# Patient Record
Sex: Female | Born: 1960 | Race: White | Hispanic: No | Smoking: Never smoker
Health system: Southern US, Community
[De-identification: ages and names within clinical notes are randomized; demographics above are authoritative.]

## PROBLEM LIST (undated history)

## (undated) DIAGNOSIS — Z8619 Personal history of other infectious and parasitic diseases: Secondary | ICD-10-CM

## (undated) DIAGNOSIS — I519 Heart disease, unspecified: Secondary | ICD-10-CM

## (undated) DIAGNOSIS — I2510 Atherosclerotic heart disease of native coronary artery without angina pectoris: Secondary | ICD-10-CM

## (undated) DIAGNOSIS — K589 Irritable bowel syndrome without diarrhea: Secondary | ICD-10-CM

## (undated) DIAGNOSIS — H18459 Nodular corneal degeneration, unspecified eye: Secondary | ICD-10-CM

## (undated) DIAGNOSIS — I2119 ST elevation (STEMI) myocardial infarction involving other coronary artery of inferior wall: Secondary | ICD-10-CM

## (undated) DIAGNOSIS — R140 Abdominal distension (gaseous): Secondary | ICD-10-CM

## (undated) DIAGNOSIS — G4733 Obstructive sleep apnea (adult) (pediatric): Principal | ICD-10-CM

## (undated) DIAGNOSIS — G4730 Sleep apnea, unspecified: Secondary | ICD-10-CM

## (undated) DIAGNOSIS — J302 Other seasonal allergic rhinitis: Secondary | ICD-10-CM

## (undated) DIAGNOSIS — E785 Hyperlipidemia, unspecified: Secondary | ICD-10-CM

## (undated) HISTORY — DX: Other seasonal allergic rhinitis: J30.2

## (undated) HISTORY — DX: Abdominal distension (gaseous): R14.0

## (undated) HISTORY — PX: OTHER SURGICAL HISTORY: SHX169

## (undated) HISTORY — DX: Sleep apnea, unspecified: G47.30

## (undated) HISTORY — DX: Irritable bowel syndrome, unspecified: K58.9

## (undated) HISTORY — PX: CARDIAC CATHETERIZATION: SHX172

## (undated) HISTORY — DX: Nodular corneal degeneration, unspecified eye: H18.459

## (undated) HISTORY — DX: ST elevation (STEMI) myocardial infarction involving other coronary artery of inferior wall: I21.19

## (undated) HISTORY — DX: Heart disease, unspecified: I51.9

## (undated) HISTORY — DX: Obstructive sleep apnea (adult) (pediatric): G47.33

## (undated) HISTORY — DX: Hyperlipidemia, unspecified: E78.5

---
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
## 1979-07-15 HISTORY — PX: WISDOM TOOTH EXTRACTION: SHX21

## 1998-09-13 ENCOUNTER — Other Ambulatory Visit: Admission: RE | Admit: 1998-09-13 | Discharge: 1998-09-13 | Payer: Self-pay | Admitting: Obstetrics and Gynecology

## 1999-09-24 ENCOUNTER — Other Ambulatory Visit: Admission: RE | Admit: 1999-09-24 | Discharge: 1999-09-24 | Payer: Self-pay | Admitting: Obstetrics and Gynecology

## 2001-01-07 ENCOUNTER — Other Ambulatory Visit: Admission: RE | Admit: 2001-01-07 | Discharge: 2001-01-07 | Payer: Self-pay | Admitting: Obstetrics and Gynecology

## 2002-02-09 ENCOUNTER — Other Ambulatory Visit: Admission: RE | Admit: 2002-02-09 | Discharge: 2002-02-09 | Payer: Self-pay | Admitting: Obstetrics and Gynecology

## 2002-12-08 ENCOUNTER — Encounter: Admission: RE | Admit: 2002-12-08 | Discharge: 2003-03-08 | Payer: Self-pay | Admitting: Family Medicine

## 2003-02-13 ENCOUNTER — Other Ambulatory Visit: Admission: RE | Admit: 2003-02-13 | Discharge: 2003-02-13 | Payer: Self-pay | Admitting: Obstetrics and Gynecology

## 2004-03-20 ENCOUNTER — Encounter: Admission: RE | Admit: 2004-03-20 | Discharge: 2004-03-20 | Payer: Self-pay | Admitting: Family Medicine

## 2004-04-18 ENCOUNTER — Ambulatory Visit (HOSPITAL_COMMUNITY): Admission: RE | Admit: 2004-04-18 | Discharge: 2004-04-18 | Payer: Self-pay | Admitting: Family Medicine

## 2005-07-14 DIAGNOSIS — G4730 Sleep apnea, unspecified: Secondary | ICD-10-CM

## 2005-07-14 HISTORY — DX: Sleep apnea, unspecified: G47.30

## 2007-02-09 ENCOUNTER — Encounter: Admission: RE | Admit: 2007-02-09 | Discharge: 2007-02-09 | Payer: Self-pay | Admitting: Family Medicine

## 2007-03-10 ENCOUNTER — Ambulatory Visit: Payer: Self-pay | Admitting: Internal Medicine

## 2007-03-10 DIAGNOSIS — R5383 Other fatigue: Secondary | ICD-10-CM | POA: Insufficient documentation

## 2007-03-10 DIAGNOSIS — R5381 Other malaise: Secondary | ICD-10-CM | POA: Insufficient documentation

## 2008-02-03 ENCOUNTER — Ambulatory Visit (HOSPITAL_COMMUNITY): Admission: RE | Admit: 2008-02-03 | Discharge: 2008-02-03 | Payer: Self-pay | Admitting: Obstetrics and Gynecology

## 2009-03-22 ENCOUNTER — Ambulatory Visit (HOSPITAL_COMMUNITY): Admission: RE | Admit: 2009-03-22 | Discharge: 2009-03-22 | Payer: Self-pay | Admitting: Obstetrics and Gynecology

## 2009-04-27 ENCOUNTER — Encounter: Admission: RE | Admit: 2009-04-27 | Discharge: 2009-04-27 | Payer: Self-pay | Admitting: Obstetrics and Gynecology

## 2009-05-23 ENCOUNTER — Encounter: Admission: RE | Admit: 2009-05-23 | Discharge: 2009-05-23 | Payer: Self-pay | Admitting: Internal Medicine

## 2009-05-23 ENCOUNTER — Other Ambulatory Visit: Admission: RE | Admit: 2009-05-23 | Discharge: 2009-05-23 | Payer: Self-pay | Admitting: Interventional Radiology

## 2009-05-23 ENCOUNTER — Encounter (INDEPENDENT_AMBULATORY_CARE_PROVIDER_SITE_OTHER): Payer: Self-pay | Admitting: Interventional Radiology

## 2010-09-18 ENCOUNTER — Other Ambulatory Visit (HOSPITAL_COMMUNITY): Payer: Self-pay | Admitting: Obstetrics and Gynecology

## 2010-09-18 DIAGNOSIS — Z1231 Encounter for screening mammogram for malignant neoplasm of breast: Secondary | ICD-10-CM

## 2010-09-26 ENCOUNTER — Ambulatory Visit (HOSPITAL_COMMUNITY)
Admission: RE | Admit: 2010-09-26 | Discharge: 2010-09-26 | Disposition: A | Discharge status: Home / Self Care | Payer: BC Managed Care – PPO | Source: Ambulatory Visit | Attending: Obstetrics and Gynecology | Admitting: Obstetrics and Gynecology

## 2010-09-26 DIAGNOSIS — Z1231 Encounter for screening mammogram for malignant neoplasm of breast: Secondary | ICD-10-CM

## 2011-06-02 ENCOUNTER — Other Ambulatory Visit: Payer: Self-pay | Admitting: Registered Nurse

## 2011-06-02 ENCOUNTER — Other Ambulatory Visit (HOSPITAL_COMMUNITY)
Admission: RE | Admit: 2011-06-02 | Discharge: 2011-06-02 | Disposition: A | Discharge status: Home / Self Care | Payer: BC Managed Care – PPO | Source: Ambulatory Visit | Attending: Internal Medicine | Admitting: Internal Medicine

## 2011-06-02 ENCOUNTER — Other Ambulatory Visit: Payer: Self-pay | Admitting: Internal Medicine

## 2011-06-02 DIAGNOSIS — E049 Nontoxic goiter, unspecified: Secondary | ICD-10-CM

## 2011-06-02 DIAGNOSIS — Z01419 Encounter for gynecological examination (general) (routine) without abnormal findings: Secondary | ICD-10-CM | POA: Insufficient documentation

## 2011-06-17 ENCOUNTER — Encounter: Payer: Self-pay | Admitting: Gastroenterology

## 2011-07-21 ENCOUNTER — Encounter: Payer: Self-pay | Admitting: Gastroenterology

## 2011-07-21 ENCOUNTER — Ambulatory Visit (AMBULATORY_SURGERY_CENTER): Payer: BC Managed Care – PPO | Admitting: *Deleted

## 2011-07-21 ENCOUNTER — Ambulatory Visit
Admission: RE | Admit: 2011-07-21 | Discharge: 2011-07-21 | Disposition: A | Discharge status: Home / Self Care | Payer: BC Managed Care – PPO | Source: Ambulatory Visit | Attending: Internal Medicine | Admitting: Internal Medicine

## 2011-07-21 VITALS — Ht 63.0 in | Wt 149.0 lb

## 2011-07-21 DIAGNOSIS — Z1211 Encounter for screening for malignant neoplasm of colon: Secondary | ICD-10-CM

## 2011-07-21 DIAGNOSIS — E049 Nontoxic goiter, unspecified: Secondary | ICD-10-CM

## 2011-07-21 MED ORDER — PEG-KCL-NACL-NASULF-NA ASC-C 100 G PO SOLR: ORAL | Status: DC

## 2011-08-05 ENCOUNTER — Ambulatory Visit (AMBULATORY_SURGERY_CENTER): Payer: BC Managed Care – PPO | Admitting: Gastroenterology

## 2011-08-05 ENCOUNTER — Encounter: Payer: Self-pay | Admitting: Gastroenterology

## 2011-08-05 VITALS — BP 125/78 | HR 73 | Temp 97.0°F | Resp 12 | Ht 63.0 in | Wt 149.0 lb

## 2011-08-05 DIAGNOSIS — Z1211 Encounter for screening for malignant neoplasm of colon: Secondary | ICD-10-CM

## 2011-08-05 MED ORDER — SODIUM CHLORIDE 0.9 % IV SOLN: 500.0000 mL | INTRAVENOUS | Status: DC

## 2011-08-05 NOTE — Progress Notes (Signed)
Patient did not experience any of the following events: a burn prior to discharge; a fall within the facility; wrong site/side/patient/procedure/implant event; or a hospital transfer or hospital admission upon discharge from the facility. (G8907) Patient did not have preoperative order for IV antibiotic SSI prophylaxis. (G8918)  

## 2011-08-05 NOTE — Patient Instructions (Signed)
DISCHARGE INSTRUCTIONS GIVEN WITH VERBAL UNDERSTANDING. NORMAL EXAM. RESUME PREVIOUS MEDICATIONS. 

## 2011-08-05 NOTE — Op Note (Signed)
[facility] [address] [place], [place]  [address]  COLONOSCOPY PROCEDURE REPORT  PATIENT:  Cathy Bell, Cathy Bell  MR#:  [id number] BIRTHDATE:  1961/02/21, 50 yrs. old  GENDER:  female ENDOSCOPIST:  Rachael Fee, MD REF. BY:  Juline Patch, M.D. PROCEDURE DATE:  08/05/2011 PROCEDURE:  Colonoscopy 84132 ASA CLASS:  Class II INDICATIONS:  Elevated Risk Screening, father had colon cancer MEDICATIONS:  Fentanyl 75 mcg IV, These medications were titrated to patient response per physician's verbal order, Versed 6 mg IV  DESCRIPTION OF PROCEDURE:   After the risks benefits and alternatives of the procedure were thoroughly explained, informed consent was obtained.  Digital rectal exam was performed and revealed no abnormalities.   The LB [id number]-[id number] endoscope was introduced through the anus and advanced to the cecum, which was identified by both the appendix and ileocecal valve, without limitations.  The quality of the prep was good..  The instrument was then slowly withdrawn as the colon was fully examined. <<PROCEDUREIMAGES>> FINDINGS:  A normal appearing cecum, ileocecal valve, and appendiceal orifice were identified. The ascending, hepatic flexure, transverse, splenic flexure, descending, sigmoid colon, and rectum appeared unremarkable (see image1, image2, and image3). Retroflexed views in the rectum revealed no abnormalities. COMPLICATIONS:  None  ENDOSCOPIC IMPRESSION: 1) Normal colon 2) No polyps or cancers  RECOMMENDATIONS: 1) Given your significant family history of colon cancer, you should have a repeat colonoscopy in 5 years  REPEAT EXAM:  5 years  ______________________________ Rachael Fee, MD  CC:  n. eSIGNED:   Rachael Fee at 08/05/2011 08:45 AM  Elliot Cousin, [phone]

## 2011-08-06 ENCOUNTER — Telehealth: Payer: Self-pay | Admitting: *Deleted

## 2011-08-06 NOTE — Telephone Encounter (Signed)

## 2012-12-09 ENCOUNTER — Other Ambulatory Visit (HOSPITAL_COMMUNITY): Payer: Self-pay | Admitting: Obstetrics & Gynecology

## 2012-12-09 DIAGNOSIS — Z1231 Encounter for screening mammogram for malignant neoplasm of breast: Secondary | ICD-10-CM

## 2012-12-10 ENCOUNTER — Ambulatory Visit (HOSPITAL_COMMUNITY)
Admission: RE | Admit: 2012-12-10 | Discharge: 2012-12-10 | Disposition: A | Discharge status: Home / Self Care | Payer: BC Managed Care – PPO | Source: Ambulatory Visit | Attending: Obstetrics & Gynecology | Admitting: Obstetrics & Gynecology

## 2012-12-10 DIAGNOSIS — Z1231 Encounter for screening mammogram for malignant neoplasm of breast: Secondary | ICD-10-CM

## 2014-01-30 ENCOUNTER — Other Ambulatory Visit (HOSPITAL_COMMUNITY): Payer: Self-pay | Admitting: Obstetrics and Gynecology

## 2014-01-30 DIAGNOSIS — Z1231 Encounter for screening mammogram for malignant neoplasm of breast: Secondary | ICD-10-CM

## 2014-02-08 ENCOUNTER — Ambulatory Visit (HOSPITAL_COMMUNITY)
Admission: RE | Admit: 2014-02-08 | Discharge: 2014-02-08 | Disposition: A | Discharge status: Home / Self Care | Payer: BC Managed Care – PPO | Source: Ambulatory Visit | Attending: Obstetrics and Gynecology | Admitting: Obstetrics and Gynecology

## 2014-02-08 DIAGNOSIS — Z1231 Encounter for screening mammogram for malignant neoplasm of breast: Secondary | ICD-10-CM | POA: Insufficient documentation

## 2014-08-02 ENCOUNTER — Other Ambulatory Visit: Payer: Self-pay | Admitting: Internal Medicine

## 2014-08-02 DIAGNOSIS — E041 Nontoxic single thyroid nodule: Secondary | ICD-10-CM

## 2014-08-15 ENCOUNTER — Ambulatory Visit
Admission: RE | Admit: 2014-08-15 | Discharge: 2014-08-15 | Disposition: A | Discharge status: Home / Self Care | Payer: BLUE CROSS/BLUE SHIELD | Source: Ambulatory Visit | Attending: Internal Medicine | Admitting: Internal Medicine

## 2014-08-15 DIAGNOSIS — E041 Nontoxic single thyroid nodule: Secondary | ICD-10-CM

## 2014-08-16 ENCOUNTER — Other Ambulatory Visit: Payer: Self-pay | Admitting: Internal Medicine

## 2014-08-16 DIAGNOSIS — E041 Nontoxic single thyroid nodule: Secondary | ICD-10-CM

## 2014-08-29 ENCOUNTER — Other Ambulatory Visit (HOSPITAL_COMMUNITY)
Admission: RE | Admit: 2014-08-29 | Discharge: 2014-08-29 | Disposition: A | Discharge status: Home / Self Care | Payer: BLUE CROSS/BLUE SHIELD | Source: Ambulatory Visit | Attending: Interventional Radiology | Admitting: Interventional Radiology

## 2014-08-29 ENCOUNTER — Ambulatory Visit
Admission: RE | Admit: 2014-08-29 | Discharge: 2014-08-29 | Disposition: A | Discharge status: Home / Self Care | Payer: BLUE CROSS/BLUE SHIELD | Source: Ambulatory Visit | Attending: Internal Medicine | Admitting: Internal Medicine

## 2014-08-29 DIAGNOSIS — E041 Nontoxic single thyroid nodule: Secondary | ICD-10-CM | POA: Diagnosis not present

## 2014-10-17 ENCOUNTER — Other Ambulatory Visit: Payer: Self-pay | Admitting: Endocrinology

## 2014-10-17 DIAGNOSIS — E041 Nontoxic single thyroid nodule: Secondary | ICD-10-CM

## 2015-04-11 ENCOUNTER — Other Ambulatory Visit: Payer: Self-pay

## 2015-04-11 DIAGNOSIS — Z1231 Encounter for screening mammogram for malignant neoplasm of breast: Secondary | ICD-10-CM

## 2015-04-30 ENCOUNTER — Ambulatory Visit
Admission: RE | Admit: 2015-04-30 | Discharge: 2015-04-30 | Disposition: A | Discharge status: Home / Self Care | Payer: BLUE CROSS/BLUE SHIELD | Source: Ambulatory Visit

## 2015-04-30 DIAGNOSIS — Z1231 Encounter for screening mammogram for malignant neoplasm of breast: Secondary | ICD-10-CM

## 2015-10-25 ENCOUNTER — Other Ambulatory Visit: Payer: BLUE CROSS/BLUE SHIELD

## 2015-12-05 DIAGNOSIS — Z1151 Encounter for screening for human papillomavirus (HPV): Secondary | ICD-10-CM | POA: Diagnosis not present

## 2015-12-05 DIAGNOSIS — R875 Abnormal microbiological findings in specimens from female genital organs: Secondary | ICD-10-CM | POA: Diagnosis not present

## 2015-12-05 DIAGNOSIS — Z6829 Body mass index (BMI) 29.0-29.9, adult: Secondary | ICD-10-CM | POA: Diagnosis not present

## 2015-12-05 DIAGNOSIS — Z01419 Encounter for gynecological examination (general) (routine) without abnormal findings: Secondary | ICD-10-CM | POA: Diagnosis not present

## 2015-12-05 LAB — HM PAP SMEAR: HM Pap smear: NEGATIVE

## 2016-04-23 ENCOUNTER — Other Ambulatory Visit: Payer: Self-pay | Admitting: Obstetrics and Gynecology

## 2016-04-23 DIAGNOSIS — Z1231 Encounter for screening mammogram for malignant neoplasm of breast: Secondary | ICD-10-CM

## 2016-05-01 ENCOUNTER — Ambulatory Visit
Admission: RE | Admit: 2016-05-01 | Discharge: 2016-05-01 | Disposition: A | Discharge status: Home / Self Care | Payer: BLUE CROSS/BLUE SHIELD | Source: Ambulatory Visit | Attending: Obstetrics and Gynecology | Admitting: Obstetrics and Gynecology

## 2016-05-01 DIAGNOSIS — Z1231 Encounter for screening mammogram for malignant neoplasm of breast: Secondary | ICD-10-CM

## 2016-05-26 DIAGNOSIS — J0100 Acute maxillary sinusitis, unspecified: Secondary | ICD-10-CM | POA: Diagnosis not present

## 2016-05-26 DIAGNOSIS — H66001 Acute suppurative otitis media without spontaneous rupture of ear drum, right ear: Secondary | ICD-10-CM | POA: Diagnosis not present

## 2016-05-26 DIAGNOSIS — Z23 Encounter for immunization: Secondary | ICD-10-CM | POA: Diagnosis not present

## 2016-05-27 DIAGNOSIS — H1840 Unspecified corneal degeneration: Secondary | ICD-10-CM | POA: Diagnosis not present

## 2016-05-30 DIAGNOSIS — J181 Lobar pneumonia, unspecified organism: Secondary | ICD-10-CM | POA: Diagnosis not present

## 2016-05-30 DIAGNOSIS — R05 Cough: Secondary | ICD-10-CM | POA: Diagnosis not present

## 2016-06-04 ENCOUNTER — Encounter: Payer: Self-pay | Admitting: Gastroenterology

## 2016-06-08 DIAGNOSIS — H9202 Otalgia, left ear: Secondary | ICD-10-CM | POA: Diagnosis not present

## 2016-06-08 DIAGNOSIS — R05 Cough: Secondary | ICD-10-CM | POA: Diagnosis not present

## 2016-06-15 IMAGING — US US SOFT TISSUE HEAD/NECK
1 series · 14 of 25 positions shown · non-contrast
Comparison: Ultrasound July 21, 2011.

CLINICAL DATA: Right-sided thyroid nodule.

EXAM:
THYROID ULTRASOUND
TECHNIQUE: Ultrasound examination of the thyroid gland and adjacent soft
tissues was performed.

[Series 1: us soft tissue head/neck · 0.06mm/px · 14 of 40 slices shown]
[im 1/40]
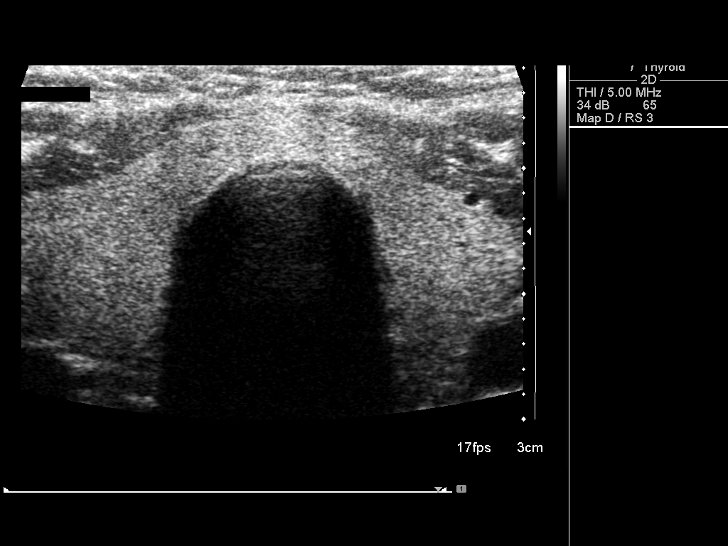
[im 4/40]
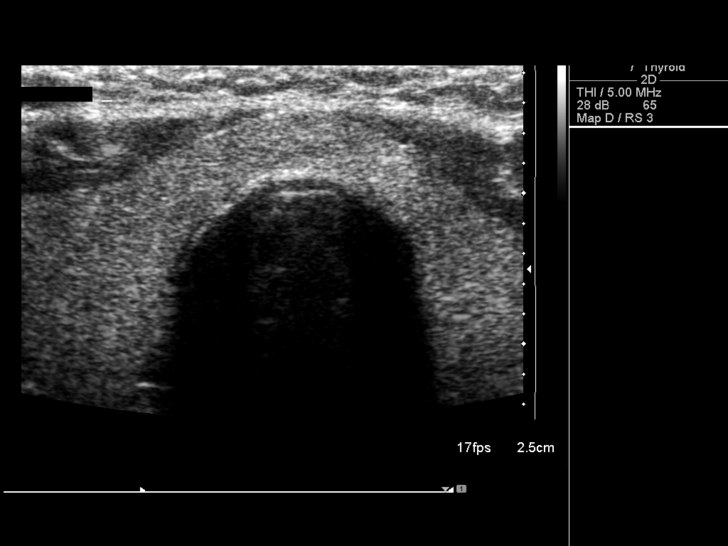
[im 7/40]
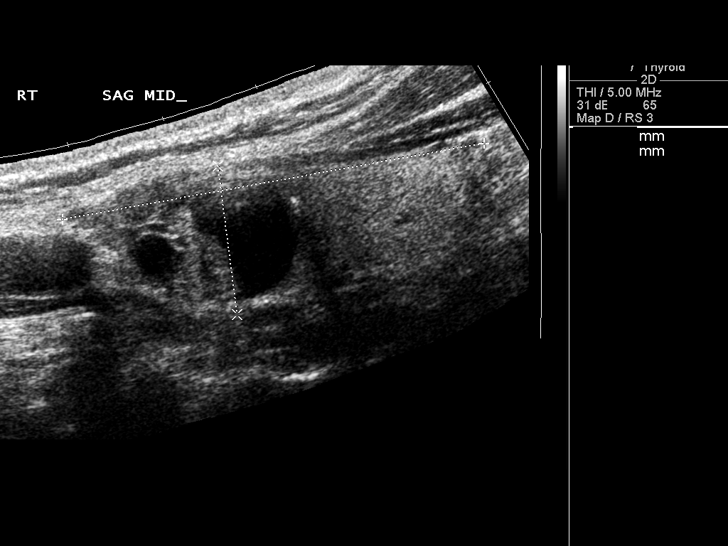
[im 10/40]
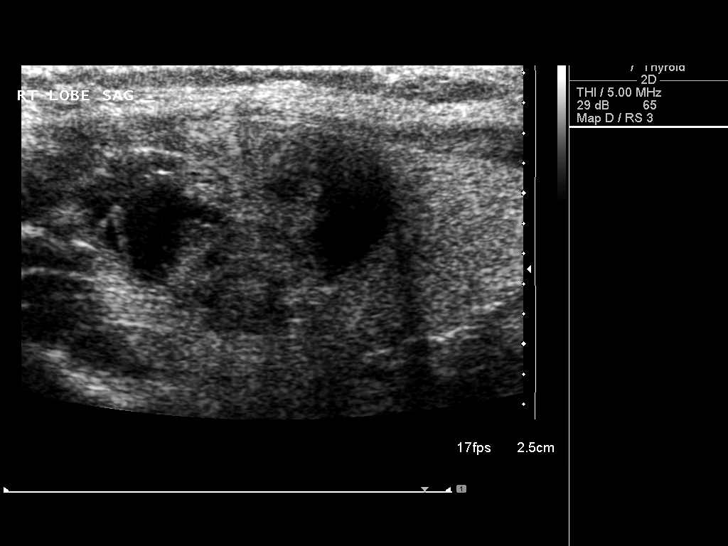
[im 14/40]
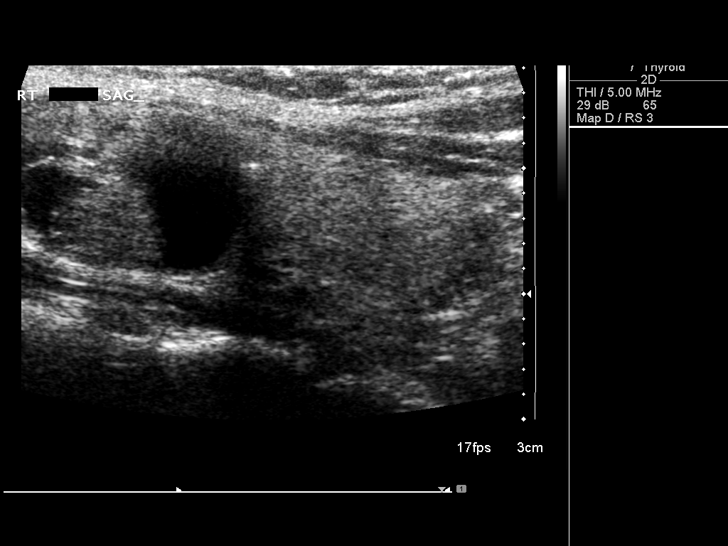
[im 15/40]
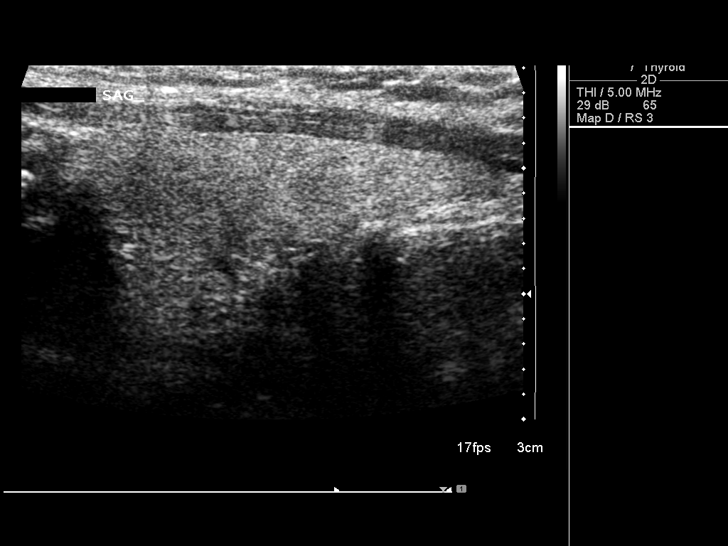
[im 18/40]
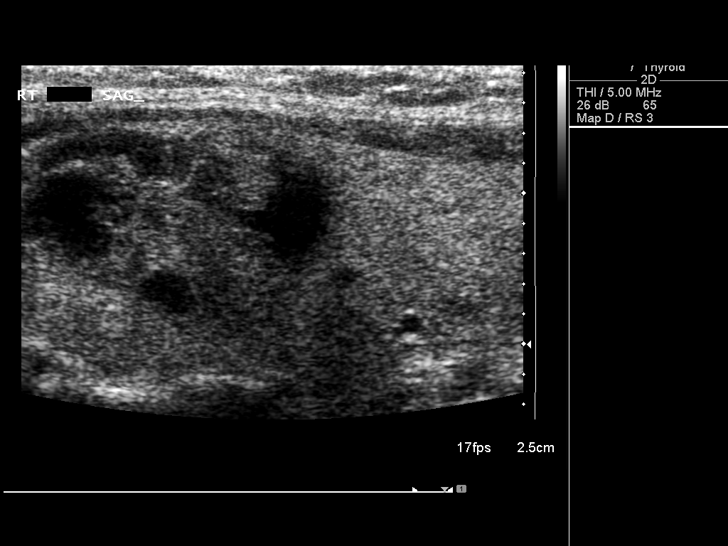
[im 22/40]
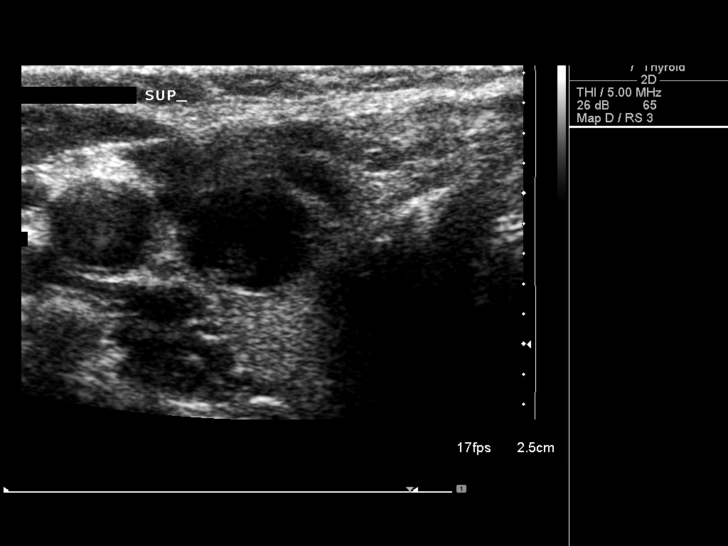
[im 25/40]
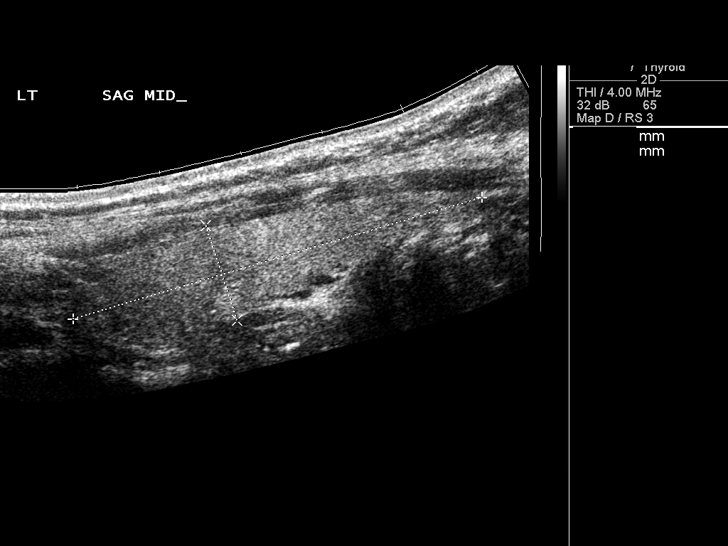
[im 27/40]
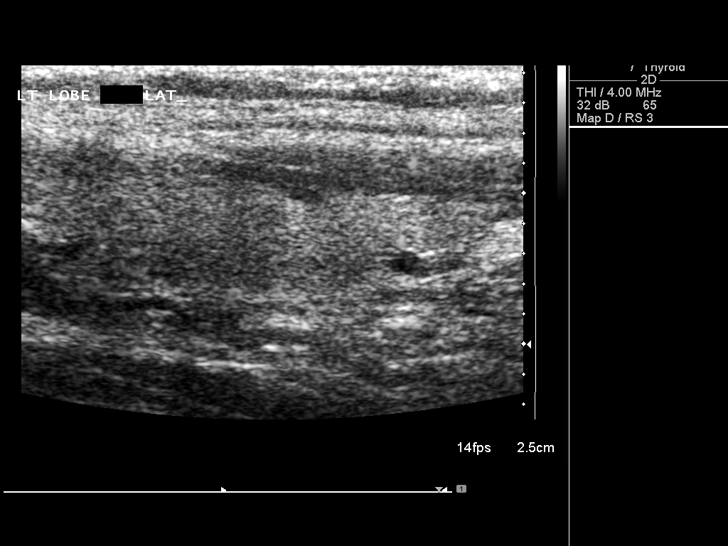
[im 30/40]
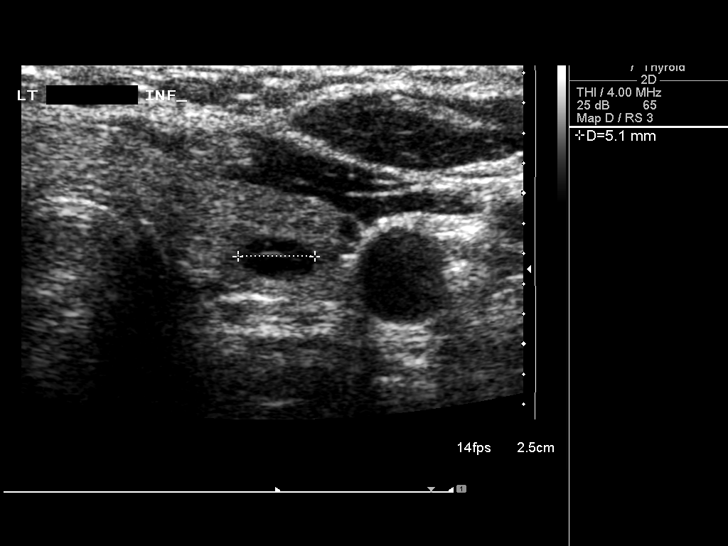
[im 33/40]
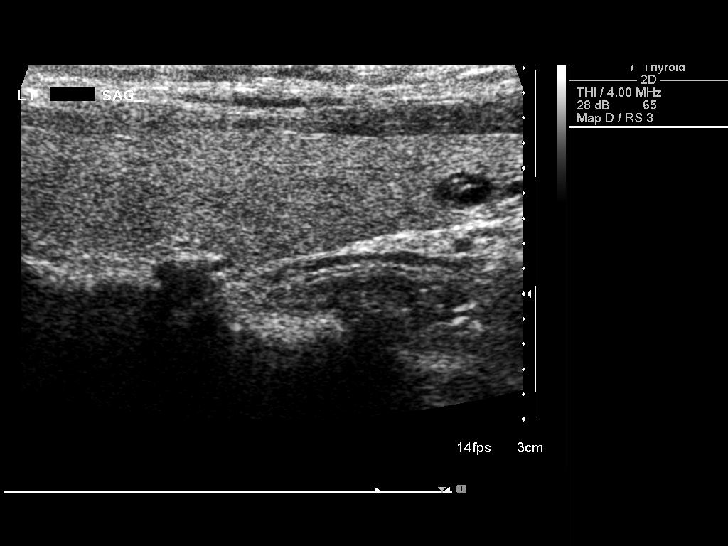
[im 36/40]
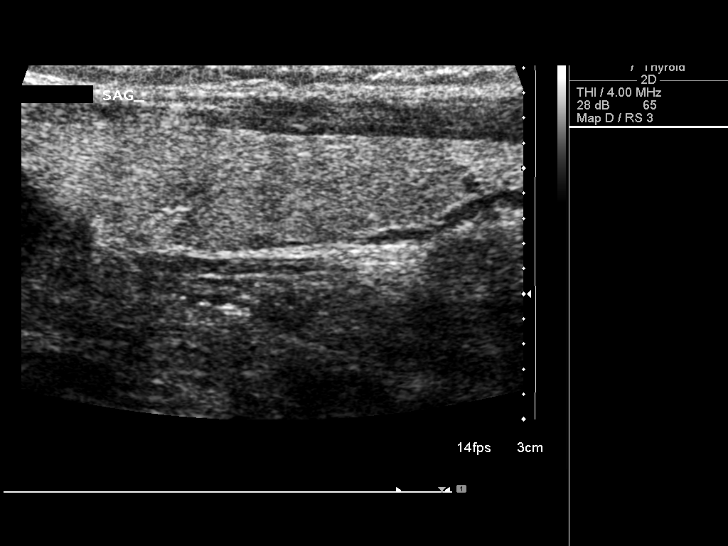
[im 40/40]
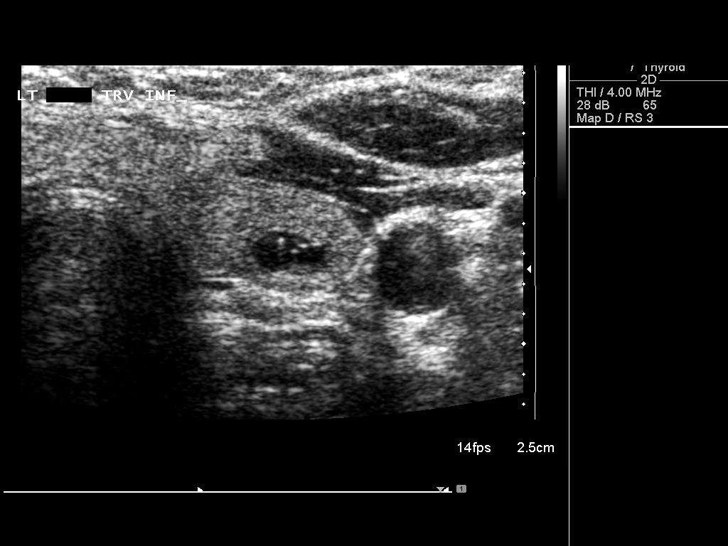

[14 of 25 positions shown; findings below may reference images not displayed]

FINDINGS: Right thyroid lobe

Measurements: 4.3 x 1.8 x 1.5 cm. Complex but predominantly solid
nodule is noted in upper pole which has significantly increased in
size compared to prior exam, now measuring 2.1 x 1.8 x 1.5 cm.
Probable small microcalcifications are noted.

Left thyroid lobe

Measurements: 5.1 x 1.4 x 1.2 cm. 5 mm cystic nodule is noted in
lower pole.

Isthmus

Thickness: 5 mm.  No nodules visualized.

Lymphadenopathy

None visualized.
IMPRESSION: Complex but predominantly solid nodule seen in upper pole of right
thyroid lobe has significantly increased in size since prior exam.
Findings meet consensus criteria for repeat biopsy.
Ultrasound-guided fine needle aspiration should be considered, as
per the consensus statement: Management of Thyroid Nodules Detected
at US: Society of Radiologists in Ultrasound Consensus Conference

## 2016-07-14 HISTORY — PX: OTHER SURGICAL HISTORY: SHX169

## 2016-09-16 DIAGNOSIS — H18453 Nodular corneal degeneration, bilateral: Secondary | ICD-10-CM | POA: Diagnosis not present

## 2016-09-16 DIAGNOSIS — L718 Other rosacea: Secondary | ICD-10-CM | POA: Diagnosis not present

## 2016-09-16 DIAGNOSIS — L719 Rosacea, unspecified: Secondary | ICD-10-CM | POA: Diagnosis not present

## 2016-12-31 DIAGNOSIS — Z6830 Body mass index (BMI) 30.0-30.9, adult: Secondary | ICD-10-CM | POA: Diagnosis not present

## 2016-12-31 DIAGNOSIS — Z01419 Encounter for gynecological examination (general) (routine) without abnormal findings: Secondary | ICD-10-CM | POA: Diagnosis not present

## 2017-01-02 ENCOUNTER — Other Ambulatory Visit: Payer: Self-pay | Admitting: Obstetrics and Gynecology

## 2017-01-02 DIAGNOSIS — E2839 Other primary ovarian failure: Secondary | ICD-10-CM

## 2017-04-09 DIAGNOSIS — H18453 Nodular corneal degeneration, bilateral: Secondary | ICD-10-CM | POA: Diagnosis not present

## 2017-04-22 ENCOUNTER — Other Ambulatory Visit: Payer: Self-pay | Admitting: Obstetrics and Gynecology

## 2017-04-22 DIAGNOSIS — Z1231 Encounter for screening mammogram for malignant neoplasm of breast: Secondary | ICD-10-CM

## 2017-05-12 ENCOUNTER — Ambulatory Visit
Admission: RE | Admit: 2017-05-12 | Discharge: 2017-05-12 | Disposition: A | Discharge status: Home / Self Care | Payer: BLUE CROSS/BLUE SHIELD | Source: Ambulatory Visit | Attending: Obstetrics and Gynecology | Admitting: Obstetrics and Gynecology

## 2017-05-12 DIAGNOSIS — E2839 Other primary ovarian failure: Secondary | ICD-10-CM

## 2017-05-12 DIAGNOSIS — Z780 Asymptomatic menopausal state: Secondary | ICD-10-CM | POA: Diagnosis not present

## 2017-05-12 DIAGNOSIS — M85851 Other specified disorders of bone density and structure, right thigh: Secondary | ICD-10-CM | POA: Diagnosis not present

## 2017-05-12 DIAGNOSIS — Z1231 Encounter for screening mammogram for malignant neoplasm of breast: Secondary | ICD-10-CM

## 2017-05-23 DIAGNOSIS — Z23 Encounter for immunization: Secondary | ICD-10-CM | POA: Diagnosis not present

## 2017-05-28 DIAGNOSIS — H18453 Nodular corneal degeneration, bilateral: Secondary | ICD-10-CM | POA: Diagnosis not present

## 2017-07-14 HISTORY — PX: STENT PLACEMENT VASCULAR (ARMC HX): HXRAD1737

## 2017-07-29 ENCOUNTER — Encounter: Payer: Self-pay | Admitting: Gastroenterology

## 2017-09-26 ENCOUNTER — Inpatient Hospital Stay (HOSPITAL_COMMUNITY)
Admission: EM | Disposition: A | Discharge status: Home / Self Care | Payer: Self-pay | Source: Home / Self Care | Attending: Cardiovascular Disease

## 2017-09-26 ENCOUNTER — Encounter (HOSPITAL_COMMUNITY): Payer: Self-pay | Admitting: *Deleted

## 2017-09-26 ENCOUNTER — Inpatient Hospital Stay (HOSPITAL_COMMUNITY)
Admission: EM | Admit: 2017-09-26 | Discharge: 2017-09-29 | DRG: 247 | Disposition: A | Discharge status: Home / Self Care | Payer: BLUE CROSS/BLUE SHIELD | Attending: Cardiovascular Disease | Admitting: Cardiovascular Disease

## 2017-09-26 ENCOUNTER — Other Ambulatory Visit: Payer: Self-pay

## 2017-09-26 DIAGNOSIS — I2582 Chronic total occlusion of coronary artery: Secondary | ICD-10-CM | POA: Diagnosis present

## 2017-09-26 DIAGNOSIS — Z6830 Body mass index (BMI) 30.0-30.9, adult: Secondary | ICD-10-CM | POA: Diagnosis not present

## 2017-09-26 DIAGNOSIS — R531 Weakness: Secondary | ICD-10-CM | POA: Diagnosis not present

## 2017-09-26 DIAGNOSIS — I214 Non-ST elevation (NSTEMI) myocardial infarction: Secondary | ICD-10-CM | POA: Diagnosis not present

## 2017-09-26 DIAGNOSIS — E7849 Other hyperlipidemia: Secondary | ICD-10-CM | POA: Diagnosis present

## 2017-09-26 DIAGNOSIS — I213 ST elevation (STEMI) myocardial infarction of unspecified site: Secondary | ICD-10-CM | POA: Diagnosis not present

## 2017-09-26 DIAGNOSIS — Z955 Presence of coronary angioplasty implant and graft: Secondary | ICD-10-CM

## 2017-09-26 DIAGNOSIS — R079 Chest pain, unspecified: Secondary | ICD-10-CM | POA: Diagnosis not present

## 2017-09-26 DIAGNOSIS — E663 Overweight: Secondary | ICD-10-CM | POA: Diagnosis not present

## 2017-09-26 DIAGNOSIS — Z8249 Family history of ischemic heart disease and other diseases of the circulatory system: Secondary | ICD-10-CM

## 2017-09-26 DIAGNOSIS — I2111 ST elevation (STEMI) myocardial infarction involving right coronary artery: Secondary | ICD-10-CM

## 2017-09-26 DIAGNOSIS — I472 Ventricular tachycardia: Secondary | ICD-10-CM | POA: Diagnosis not present

## 2017-09-26 DIAGNOSIS — I2119 ST elevation (STEMI) myocardial infarction involving other coronary artery of inferior wall: Principal | ICD-10-CM | POA: Diagnosis present

## 2017-09-26 DIAGNOSIS — F419 Anxiety disorder, unspecified: Secondary | ICD-10-CM | POA: Diagnosis present

## 2017-09-26 DIAGNOSIS — Z8632 Personal history of gestational diabetes: Secondary | ICD-10-CM | POA: Diagnosis not present

## 2017-09-26 DIAGNOSIS — G4700 Insomnia, unspecified: Secondary | ICD-10-CM | POA: Diagnosis not present

## 2017-09-26 DIAGNOSIS — I2510 Atherosclerotic heart disease of native coronary artery without angina pectoris: Secondary | ICD-10-CM | POA: Diagnosis not present

## 2017-09-26 DIAGNOSIS — R0602 Shortness of breath: Secondary | ICD-10-CM | POA: Diagnosis not present

## 2017-09-26 DIAGNOSIS — E7800 Pure hypercholesterolemia, unspecified: Secondary | ICD-10-CM | POA: Diagnosis not present

## 2017-09-26 DIAGNOSIS — G4733 Obstructive sleep apnea (adult) (pediatric): Secondary | ICD-10-CM | POA: Diagnosis present

## 2017-09-26 DIAGNOSIS — Z800 Family history of malignant neoplasm of digestive organs: Secondary | ICD-10-CM | POA: Diagnosis not present

## 2017-09-26 DIAGNOSIS — I959 Hypotension, unspecified: Secondary | ICD-10-CM | POA: Diagnosis present

## 2017-09-26 DIAGNOSIS — R0789 Other chest pain: Secondary | ICD-10-CM | POA: Diagnosis not present

## 2017-09-26 HISTORY — PX: CORONARY/GRAFT ACUTE MI REVASCULARIZATION: CATH118305

## 2017-09-26 HISTORY — DX: ST elevation (STEMI) myocardial infarction involving other coronary artery of inferior wall: I21.19

## 2017-09-26 HISTORY — PX: LEFT HEART CATH AND CORONARY ANGIOGRAPHY: CATH118249

## 2017-09-26 LAB — COMPREHENSIVE METABOLIC PANEL
ALT: 14 U/L (ref 14–54)
AST: 22 U/L (ref 15–41)
Albumin: 4.2 g/dL (ref 3.5–5.0)
Alkaline Phosphatase: 71 U/L (ref 38–126)
Anion gap: 15 (ref 5–15)
BILIRUBIN TOTAL: 1.1 mg/dL (ref 0.3–1.2)
BUN: 13 mg/dL (ref 6–20)
CALCIUM: 9 mg/dL (ref 8.9–10.3)
CO2: 18 mmol/L — ABNORMAL LOW (ref 22–32)
CREATININE: 0.78 mg/dL (ref 0.44–1.00)
Chloride: 102 mmol/L (ref 101–111)
Glucose, Bld: 164 mg/dL — ABNORMAL HIGH (ref 65–99)
Potassium: 3.4 mmol/L — ABNORMAL LOW (ref 3.5–5.1)
Sodium: 135 mmol/L (ref 135–145)
TOTAL PROTEIN: 7.1 g/dL (ref 6.5–8.1)

## 2017-09-26 LAB — HEMOGLOBIN A1C
Hgb A1c MFr Bld: 5.6 % (ref 4.8–5.6)
Mean Plasma Glucose: 114.02 mg/dL

## 2017-09-26 LAB — PROTIME-INR
INR: 0.99
Prothrombin Time: 13 seconds (ref 11.4–15.2)

## 2017-09-26 LAB — POCT I-STAT, CHEM 8
BUN: 15 mg/dL (ref 6–20)
CHLORIDE: 105 mmol/L (ref 101–111)
Calcium, Ion: 1.1 mmol/L — ABNORMAL LOW (ref 1.15–1.40)
Creatinine, Ser: 0.7 mg/dL (ref 0.44–1.00)
Glucose, Bld: 164 mg/dL — ABNORMAL HIGH (ref 65–99)
HEMATOCRIT: 43 % (ref 36.0–46.0)
Hemoglobin: 14.6 g/dL (ref 12.0–15.0)
POTASSIUM: 3.5 mmol/L (ref 3.5–5.1)
SODIUM: 139 mmol/L (ref 135–145)
TCO2: 21 mmol/L — AB (ref 22–32)

## 2017-09-26 LAB — POCT I-STAT TROPONIN I: Troponin i, poc: 0 ng/mL (ref 0.00–0.08)

## 2017-09-26 LAB — CBC WITH DIFFERENTIAL/PLATELET
BASOS PCT: 0 %
Basophils Absolute: 0 10*3/uL (ref 0.0–0.1)
EOS PCT: 2 %
Eosinophils Absolute: 0.1 10*3/uL (ref 0.0–0.7)
HEMATOCRIT: 40.7 % (ref 36.0–46.0)
Hemoglobin: 13.7 g/dL (ref 12.0–15.0)
Lymphocytes Relative: 31 %
Lymphs Abs: 2.7 10*3/uL (ref 0.7–4.0)
MCH: 29.3 pg (ref 26.0–34.0)
MCHC: 33.7 g/dL (ref 30.0–36.0)
MCV: 87.2 fL (ref 78.0–100.0)
MONO ABS: 0.5 10*3/uL (ref 0.1–1.0)
MONOS PCT: 6 %
NEUTROS ABS: 5.4 10*3/uL (ref 1.7–7.7)
Neutrophils Relative %: 61 %
PLATELETS: 189 10*3/uL (ref 150–400)
RBC: 4.67 MIL/uL (ref 3.87–5.11)
RDW: 13.2 % (ref 11.5–15.5)
WBC: 8.8 10*3/uL (ref 4.0–10.5)

## 2017-09-26 LAB — BASIC METABOLIC PANEL
ANION GAP: 12 (ref 5–15)
BUN: 9 mg/dL (ref 6–20)
CALCIUM: 8.7 mg/dL — AB (ref 8.9–10.3)
CO2: 19 mmol/L — ABNORMAL LOW (ref 22–32)
Chloride: 107 mmol/L (ref 101–111)
Creatinine, Ser: 0.53 mg/dL (ref 0.44–1.00)
GLUCOSE: 83 mg/dL (ref 65–99)
Potassium: 3.7 mmol/L (ref 3.5–5.1)
SODIUM: 138 mmol/L (ref 135–145)

## 2017-09-26 LAB — LIPID PANEL
Cholesterol: 267 mg/dL — ABNORMAL HIGH (ref 0–200)
HDL: 54 mg/dL (ref >40)
LDL Cholesterol: 187 mg/dL — ABNORMAL HIGH (ref 0–99)
Total CHOL/HDL Ratio: 4.9 RATIO
Triglycerides: 130 mg/dL (ref <150)
VLDL: 26 mg/dL (ref 0–40)

## 2017-09-26 LAB — TROPONIN I
TROPONIN I: 3.52 ng/mL — AB (ref <0.03)
Troponin I: 0.91 ng/mL (ref <0.03)

## 2017-09-26 LAB — MAGNESIUM: MAGNESIUM: 1.9 mg/dL (ref 1.7–2.4)

## 2017-09-26 LAB — I-STAT BETA HCG BLOOD, ED (NOT ORDERABLE): I-stat hCG, quantitative: 5.9 m[IU]/mL — ABNORMAL HIGH (ref <5)

## 2017-09-26 LAB — MRSA PCR SCREENING: MRSA BY PCR: NEGATIVE

## 2017-09-26 LAB — TSH: TSH: 0.961 u[IU]/mL (ref 0.350–4.500)

## 2017-09-26 LAB — APTT: aPTT: 25 seconds (ref 24–36)

## 2017-09-26 LAB — POCT ACTIVATED CLOTTING TIME: Activated Clotting Time: 142 seconds

## 2017-09-26 SURGERY — CORONARY/GRAFT ACUTE MI REVASCULARIZATION
Anesthesia: LOCAL

## 2017-09-26 MED ORDER — ATORVASTATIN CALCIUM 80 MG PO TABS
80.0000 mg | ORAL_TABLET | Freq: Every day | ORAL | Status: DC
  Administered 2017-09-26 – 2017-09-28 (×3): 80 mg via ORAL
  Filled 2017-09-26 (×3): qty 1

## 2017-09-26 MED ORDER — ZOLPIDEM TARTRATE 5 MG PO TABS: 5.0000 mg | ORAL_TABLET | Freq: Every evening | ORAL | Status: DC | PRN

## 2017-09-26 MED ORDER — HEPARIN SODIUM (PORCINE) 5000 UNIT/ML IJ SOLN: 60.0000 [IU]/kg | Freq: Once | INTRAMUSCULAR | Status: DC

## 2017-09-26 MED ORDER — ALPRAZOLAM 0.25 MG PO TABS: 0.2500 mg | ORAL_TABLET | Freq: Two times a day (BID) | ORAL | Status: DC | PRN

## 2017-09-26 MED ORDER — ATROPINE SULFATE 1 MG/10ML IJ SOSY
PREFILLED_SYRINGE | INTRAMUSCULAR | Status: DC | PRN
  Administered 2017-09-26: 0.5 mg via INTRAVENOUS

## 2017-09-26 MED ORDER — HEPARIN (PORCINE) IN NACL 2-0.9 UNIT/ML-% IJ SOLN
INTRAMUSCULAR | Status: AC
  Filled 2017-09-26: qty 1000

## 2017-09-26 MED ORDER — LIDOCAINE HCL 1 % IJ SOLN
INTRAMUSCULAR | Status: AC
  Filled 2017-09-26: qty 20

## 2017-09-26 MED ORDER — IOPAMIDOL (ISOVUE-370) INJECTION 76%
INTRAVENOUS | Status: AC
  Filled 2017-09-26: qty 125

## 2017-09-26 MED ORDER — ACETAMINOPHEN 325 MG PO TABS: 650.0000 mg | ORAL_TABLET | ORAL | Status: DC | PRN

## 2017-09-26 MED ORDER — ONDANSETRON HCL 4 MG/2ML IJ SOLN: 4.0000 mg | Freq: Four times a day (QID) | INTRAMUSCULAR | Status: DC | PRN

## 2017-09-26 MED ORDER — LABETALOL HCL 5 MG/ML IV SOLN: 10.0000 mg | INTRAVENOUS | Status: AC | PRN

## 2017-09-26 MED ORDER — SODIUM CHLORIDE 0.9 % IV SOLN: 250.0000 mL | INTRAVENOUS | Status: DC | PRN

## 2017-09-26 MED ORDER — SODIUM CHLORIDE 0.9% FLUSH: 3.0000 mL | Freq: Two times a day (BID) | INTRAVENOUS | Status: DC

## 2017-09-26 MED ORDER — BIVALIRUDIN BOLUS VIA INFUSION - CUPID
INTRAVENOUS | Status: DC | PRN
  Administered 2017-09-26: 50.6895 mg via INTRAVENOUS

## 2017-09-26 MED ORDER — SODIUM CHLORIDE 0.9 % IV SOLN
INTRAVENOUS | Status: AC | PRN
  Administered 2017-09-26 (×2): 1.75 mg/kg/h via INTRAVENOUS

## 2017-09-26 MED ORDER — TICAGRELOR 90 MG PO TABS
90.0000 mg | ORAL_TABLET | Freq: Two times a day (BID) | ORAL | Status: DC
  Administered 2017-09-26 – 2017-09-29 (×6): 90 mg via ORAL
  Filled 2017-09-26 (×6): qty 1

## 2017-09-26 MED ORDER — IOPAMIDOL (ISOVUE-370) INJECTION 76%
INTRAVENOUS | Status: DC | PRN
  Administered 2017-09-26: 135 mL

## 2017-09-26 MED ORDER — ASPIRIN 81 MG PO CHEW
81.0000 mg | CHEWABLE_TABLET | Freq: Every day | ORAL | Status: DC
  Administered 2017-09-27 – 2017-09-29 (×3): 81 mg via ORAL
  Filled 2017-09-26 (×3): qty 1

## 2017-09-26 MED ORDER — MORPHINE SULFATE (PF) 2 MG/ML IV SOLN: 2.0000 mg | INTRAVENOUS | Status: DC | PRN

## 2017-09-26 MED ORDER — NITROGLYCERIN 0.4 MG SL SUBL: 0.4000 mg | SUBLINGUAL_TABLET | SUBLINGUAL | Status: DC | PRN

## 2017-09-26 MED ORDER — SODIUM CHLORIDE 0.9% FLUSH: 3.0000 mL | INTRAVENOUS | Status: DC | PRN

## 2017-09-26 MED ORDER — SODIUM CHLORIDE 0.9 % IV SOLN
INTRAVENOUS | Status: DC
  Administered 2017-09-26: 100 mL/h via INTRAVENOUS
  Administered 2017-09-26: 500 mL via INTRAVENOUS

## 2017-09-26 MED ORDER — BIVALIRUDIN TRIFLUOROACETATE 250 MG IV SOLR
INTRAVENOUS | Status: AC
  Filled 2017-09-26: qty 250

## 2017-09-26 MED ORDER — SODIUM CHLORIDE 0.9 % IV SOLN
1.7500 mg/kg/h | INTRAVENOUS | Status: AC
  Administered 2017-09-26: 1.75 mg/kg/h via INTRAVENOUS
  Filled 2017-09-26: qty 250

## 2017-09-26 MED ORDER — ASPIRIN 81 MG PO CHEW: 324.0000 mg | CHEWABLE_TABLET | Freq: Once | ORAL | Status: DC

## 2017-09-26 MED ORDER — LIDOCAINE HCL (PF) 1 % IJ SOLN
INTRAMUSCULAR | Status: DC | PRN
  Administered 2017-09-26: 20 mL

## 2017-09-26 MED ORDER — HEPARIN SODIUM (PORCINE) 5000 UNIT/ML IJ SOLN
INTRAMUSCULAR | Status: AC
  Administered 2017-09-26: 5000 [IU]
  Filled 2017-09-26: qty 1

## 2017-09-26 MED ORDER — TICAGRELOR 90 MG PO TABS
ORAL_TABLET | ORAL | Status: DC | PRN
  Administered 2017-09-26: 180 mg via ORAL

## 2017-09-26 MED ORDER — ATROPINE SULFATE 1 MG/10ML IJ SOSY
PREFILLED_SYRINGE | INTRAMUSCULAR | Status: AC
  Filled 2017-09-26: qty 10

## 2017-09-26 MED ORDER — TICAGRELOR 90 MG PO TABS
ORAL_TABLET | ORAL | Status: AC
  Filled 2017-09-26: qty 2

## 2017-09-26 MED ORDER — SODIUM CHLORIDE 0.9% FLUSH
3.0000 mL | Freq: Two times a day (BID) | INTRAVENOUS | Status: DC
  Administered 2017-09-26 – 2017-09-29 (×6): 3 mL via INTRAVENOUS

## 2017-09-26 MED ORDER — ATORVASTATIN CALCIUM 80 MG PO TABS: 80.0000 mg | ORAL_TABLET | Freq: Every day | ORAL | Status: DC

## 2017-09-26 MED ORDER — SODIUM CHLORIDE 0.9 % IV SOLN
INTRAVENOUS | Status: AC
  Administered 2017-09-26: 13:00:00 via INTRAVENOUS

## 2017-09-26 MED ORDER — ACETAMINOPHEN 325 MG PO TABS
650.0000 mg | ORAL_TABLET | ORAL | Status: DC | PRN
  Administered 2017-09-27 – 2017-09-28 (×2): 650 mg via ORAL
  Filled 2017-09-26 (×2): qty 2

## 2017-09-26 MED ORDER — HEPARIN (PORCINE) IN NACL 2-0.9 UNIT/ML-% IJ SOLN
INTRAMUSCULAR | Status: AC | PRN
  Administered 2017-09-26 (×2): 500 mL

## 2017-09-26 MED ORDER — HYDRALAZINE HCL 20 MG/ML IJ SOLN: 5.0000 mg | INTRAMUSCULAR | Status: AC | PRN

## 2017-09-26 SURGICAL SUPPLY — 15 items
BALLN SAPPHIRE 2.0X12 (BALLOONS) ×2
BALLN SAPPHIRE ~~LOC~~ 3.25X15 (BALLOONS) ×2 IMPLANT
BALLOON SAPPHIRE 2.0X12 (BALLOONS) ×1 IMPLANT
CATH INFINITI 5FR MULTPACK ANG (CATHETERS) ×2 IMPLANT
CATH VISTA GUIDE 6FR JR4 (CATHETERS) ×2 IMPLANT
KIT ENCORE 26 ADVANTAGE (KITS) ×4 IMPLANT
KIT HEART LEFT (KITS) ×2 IMPLANT
PACK CARDIAC CATHETERIZATION (CUSTOM PROCEDURE TRAY) ×2 IMPLANT
SHEATH PINNACLE 6F 10CM (SHEATH) ×2 IMPLANT
STENT SYNERGY DES 3X24 (Permanent Stent) ×2 IMPLANT
SYR MEDRAD MARK V 150ML (SYRINGE) ×2 IMPLANT
TRANSDUCER W/STOPCOCK (MISCELLANEOUS) ×2 IMPLANT
TUBING CIL FLEX 10 FLL-RA (TUBING) ×2 IMPLANT
WIRE ASAHI PROWATER 180CM (WIRE) ×2 IMPLANT
WIRE EMERALD 3MM-J .035X150CM (WIRE) ×2 IMPLANT

## 2017-09-26 NOTE — ED Notes (Signed)
Dr. Gwenlyn Found at beside.

## 2017-09-26 NOTE — ED Provider Notes (Signed)
[facility] CATH LAB Provider Note   CSN: [id number] Arrival date & time: 09/26/17  1013     History   Chief Complaint Chief Complaint  Patient presents with  . Code STEMI    HPI DOMINIK LAURICELLA is a 57 y.o. female.  Chief complaint is shortness of breath, chest pain while walking  HPI 57 year old female.  Was walking in [employer].  She does this every weekend.  After about 20 minutes she became short of breath.  She developed anterior chest pain that radiated to the base of her neck and to her back.  She felt nausea.  She felt she had to have a bowel movement.  She felt worse.  911 was called.  Was found to have marked inferior ST elevations.  She was hemo-dynamically stable in route until her last pressure read 90 systolic.  Was given aspirin.  Was not given additional medications.  Has medical history "prediabetes, high cholesterol.  Not on medication for either.  Typically has "low blood pressure".  Family history of heart disease in parents.  Sons with PFO's.  Previous episodes of chest pain.  Is never been evaluated by cardiology.  Past Medical History:  Diagnosis Date  . Sleep apnea 2007   uses c pap on occasion    Patient Active Problem List   Diagnosis Date Noted  . Acute ST elevation myocardial infarction (STEMI) of inferior wall ([place]) 09/26/2017  . FATIGUE, CHRONIC 03/10/2007    Past Surgical History:  Procedure Laterality Date  . WISDOM TOOTH EXTRACTION  1981    OB History    No data available       Home Medications    Prior to Admission medications   Medication Sig Start Date End Date Taking? Authorizing Provider  Polyethyl Glycol-Propyl Glycol (SYSTANE ULTRA) 0.4-0.3 % SOLN Apply 1 drop to eye at bedtime.      [provider]  sodium chloride (MURO 128) 2 % ophthalmic solution Place 1 drop into both eyes as needed.      [provider]    Family History Family History  Problem Relation Age  of Onset  . Colon cancer Father     Social History Social History   Tobacco Use  . Smoking status: Never Smoker  . Smokeless tobacco: Never Used  Substance Use Topics  . Alcohol use: No  . Drug use: No     Allergies   Cefprozil and Wellbutrin [bupropion hcl]   Review of Systems Review of Systems  Constitutional: Positive for diaphoresis. Negative for appetite change, chills, fatigue and fever.  HENT: Negative for mouth sores, sore throat and trouble swallowing.   Eyes: Negative for visual disturbance.  Respiratory: Positive for chest tightness and shortness of breath. Negative for cough and wheezing.   Cardiovascular: Positive for chest pain.  Gastrointestinal: Positive for nausea. Negative for abdominal distention, abdominal pain, diarrhea and vomiting.  Endocrine: Negative for polydipsia, polyphagia and polyuria.  Genitourinary: Negative for dysuria, frequency and hematuria.  Musculoskeletal: Negative for gait problem.  Skin: Negative for color change, pallor and rash.  Neurological: Positive for weakness and light-headedness. Negative for dizziness, syncope and headaches.  Hematological: Does not bruise/bleed easily.  Psychiatric/Behavioral: Negative for behavioral problems and confusion.     Physical Exam Updated Vital Signs BP 118/81 (BP Location: Left Arm)   Pulse 76   Resp 16   LMP 03/18/2011   SpO2 99%   Physical Exam  Constitutional: She is oriented to  person, place, and time. No distress.  Pale.  Diaphoretic.  Speaking full sentences.  Alert and oriented.  HENT:  Head: Normocephalic.  Eyes: Conjunctivae are normal. Pupils are equal, round, and reactive to light. No scleral icterus.  Neck: Normal range of motion. Neck supple. No thyromegaly present.  Cardiovascular: Normal rate and regular rhythm. Exam reveals no gallop and no friction rub.  No murmur heard. Pulmonary/Chest: Effort normal and breath sounds normal. No respiratory distress. She has no  wheezes. She has no rales.  Clear bilateral breath sounds.  Abdominal: Soft. Bowel sounds are normal. She exhibits no distension. There is no tenderness. There is no rebound.  Musculoskeletal: Normal range of motion.  Neurological: She is alert and oriented to person, place, and time.  Skin: Skin is warm and dry. No rash noted.  Psychiatric: She has a normal mood and affect. Her behavior is normal.     ED Treatments / Results  Labs (all labs ordered are listed, but only abnormal results are displayed) Labs Reviewed  POCT I-STAT, CHEM 8 - Abnormal; Notable for the following components:      Result Value   Glucose, Bld 164 (*)    Calcium, Ion 1.10 (*)    TCO2 21 (*)    All other components within normal limits  CBC WITH DIFFERENTIAL/PLATELET  PROTIME-INR  APTT  COMPREHENSIVE METABOLIC PANEL  TROPONIN I  LIPID PANEL  I-STAT CHEM 8, ED  I-STAT TROPONIN, ED  I-STAT BETA HCG BLOOD, ED ([facility], [facility], [facility] ONLY)    EKG  EKG Interpretation None       Radiology No results found.  Procedures Procedures (including critical care time)  Medications Ordered in ED Medications  0.9 %  sodium chloride infusion (not administered)  aspirin chewable tablet 324 mg ( Oral Automatically Held 09/26/17 1030)  heparin injection 60 Units/kg ( Intravenous Automatically Held 09/26/17 1030)  lidocaine (PF) (XYLOCAINE) 1 % injection (20 mLs  Given 09/26/17 1039)  heparin 5000 UNIT/ML injection (5,000 Units  Given 09/26/17 1020)  bivalirudin (ANGIOMAX) BOLUS via infusion (not administered)     Initial Impression / Assessment and Plan / ED Course  I have reviewed the triage vital signs and the nursing notes.  Pertinent labs & imaging results that were available during my care of the patient were reviewed by me and considered in my medical decision making (see chart for details).    EKG: Sinus rhythm.  Marked ST elevations inferiorly with reciprocal anterior changes.  No ventricular  ectopy.  Prehospital course: Given aspirin 324 by paramedics.  This was confirmed.  ER course.  Pressure 161 systolic.  Given heparin 5000 units by myself.  This fluid bolus initiated as patient was complaining of feeling "lightheaded".  Maintain sinus rhythm.  Code STEMI was activated by paramedics.  Dr. Alvester Chou arrived shortly after the patient's arrival.  She maintained sinus rhythm and is hematin medically stable with out ectopy, and adequate blood pressures.  No additional medications given in the emergency room as a Cath Lab was immediately available.  CRITICAL CARE Performed by: Lolita Patella   Total critical care time: 20 minutes  Critical care time was exclusive of separately billable procedures and treating other patients.  Critical care was necessary to treat or prevent imminent or life-threatening deterioration.  Critical care was time spent personally by me on the following activities: development of treatment plan with patient and/or surrogate as well as nursing, discussions with consultants, evaluation of patient's response to treatment, examination of  patient, obtaining history from patient or surrogate, ordering and performing treatments and interventions, ordering and review of laboratory studies, ordering and review of radiographic studies, pulse oximetry and re-evaluation of patient's condition.    Final Clinical Impressions(s) / ED Diagnoses   Final diagnoses:  ST elevation myocardial infarction (STEMI), unspecified artery [facility])    ED Discharge Orders    None       Tanna Furry, MD 09/26/17 1054

## 2017-09-26 NOTE — ED Notes (Signed)
Pt taken to cath lab, along with Dr. Lovett Sox, [place]

## 2017-09-26 NOTE — Progress Notes (Signed)
Dr Gwenlyn Found returned call new orders placed will continue to monitor.

## 2017-09-26 NOTE — ED Triage Notes (Signed)
Per ems- pt finished walking [place] miles at park, while walking she had back pain, felt the urge  To have BM, actually had BM in the woods. After that they went to the car, she developed CP, became diaphoretic. Called EMS, 8/10 CP. BP 95/60, EKG showing STEMI. Given 324 aspirin, 20 g L. AC. Now 2/10 CP. Is a x 4. HR 76, 18, 98% RA.

## 2017-09-26 NOTE — Progress Notes (Signed)
Right femoral arterial sheath with steady ooze noted. No hematoma palpated. Bilateral pedal pulses 3+. Sheath pulled per order. Manual pressure held for 20 min. Hemostasis achieved. No complications observed. Patient educated on holding pressure to right groin when sneezing, coughing, bearing down. Patient demonstrated understanding. Right groin a level 0 at this time.  Call bell in reach. Will monitor.

## 2017-09-26 NOTE — H&P (Addendum)
Cardiology Admission History and Physical:   Patient ID: Cathy Bell; MRN: [id number]; DOB: 16-Jul-1960   Admission date: 09/26/2017  Primary Care Provider: Tommy Medal, MD Primary Cardiologist:  New, Dr Gwenlyn Found Primary Electrophysiologist:  n/a  Chief Complaint:  STEMI  Patient Profile:   Cathy Bell is a 57 y.o. female with a history of sleep apnea, inconsistent w/ CPAP, gestational DM, seasonal allergies.  History of Present Illness:   Cathy Bell was in her usual state of health today, walking in a local park.  She was with her friend.  She developed back pain, sudden urge to have a bowel movement and had a bowel movement in the woods.  After that, she developed substernal chest pain with radiation to her left arm.  She was short of breath.  The pain was severe.  EMS was called.  The chest pain was 8/10.  She had an inferior STEMI by ECG.  She was given 324 mg of aspirin and an IV was started for systolic blood pressure 95.  She was brought emergently to [facility] hospital.  In the emergency room, she was given heparin.  Her chest pain was waxing and waning, alternating between a 2/10 and an 8/10.  She was short of breath with this.  She felt very cold.  The pain was radiating to her left arm.  She has never had anything like this before.  Her general health is good, she walks regularly for exercise and tries to watch what she eats.  She had gestational diabetes with 1 of her children (now a teenager), but not since.   Past Medical History:  Diagnosis Date  . Sleep apnea 2007   uses c pap on occasion    Past Surgical History:  Procedure Laterality Date  . WISDOM TOOTH EXTRACTION  1981     Medications Prior to Admission: Prior to Admission medications   Medication Sig Start Date End Date Taking? Authorizing Provider  Polyethyl Glycol-Propyl Glycol (SYSTANE ULTRA) 0.4-0.3 % SOLN Apply 1 drop to eye at bedtime.      [provider]  sodium chloride (MURO 128)  2 % ophthalmic solution Place 1 drop into both eyes as needed.      [provider]     Allergies:    Allergies  Allergen Reactions  . Cefprozil Hives  . Wellbutrin [Bupropion Hcl] Hives    Social History:   Social History   Socioeconomic History  . Marital status: Divorced    Spouse name: Not on file  . Number of children: Not on file  . Years of education: Not on file  . Highest education level: Not on file  Social Needs  . Financial resource strain: Not on file  . Food insecurity - worry: Not on file  . Food insecurity - inability: Not on file  . Transportation needs - medical: Not on file  . Transportation needs - non-medical: Not on file  Occupational History  . Not on file  Tobacco Use  . Smoking status: Never Smoker  . Smokeless tobacco: Never Used  Substance and Sexual Activity  . Alcohol use: No  . Drug use: No  . Sexual activity: Not on file  Other Topics Concern  . Not on file  Social History Narrative  . Not on file    Family History:   The patient's family history includes Colon cancer in her father.   The patient indicated that her mother is alive. She indicated that her father is  alive.    ROS:  Please see the history of present illness.  All other ROS reviewed and negative.     Physical Exam/Data:   Vitals:   09/26/17 1025 09/26/17 1039  BP: 118/81   Pulse: 76   Resp: 16   SpO2: 100% 99%   No intake or output data in the 24 hours ending 09/26/17 1046 There were no vitals filed for this visit. There is no height or weight on file to calculate BMI.  General:  Well nourished, well developed, in acute distress HEENT: normal Lymph: no adenopathy Neck:  JVD not elevated Endocrine:  No thryomegaly Vascular: No carotid bruits; FA pulses 2+ bilaterally without bruits  Cardiac:  normal S1, S2; RRR; no murmur, no rub or gallop  Lungs:  clear to auscultation bilaterally, no wheezing, rhonchi or rales  Abd: soft, nontender, no  hepatomegaly  Ext: no edema Musculoskeletal:  No deformities, BUE and BLE strength normal and equal Skin: warm and dry  Neuro:  CNs 2-12 intact, no focal abnormalities noted Psych:  Normal affect    EKG:  The ECG that was done today was personally reviewed and demonstrates sinus rhythm and sinus bradycardia and another ECG, inferior ST elevation 3 mm  Relevant CV Studies:  None  Laboratory Data: Pending  ChemistryNo results for input(s): NA, K, CL, CO2, GLUCOSE, BUN, CREATININE, CALCIUM, GFRNONAA, GFRAA, ANIONGAP in the last 168 hours.  No results for input(s): PROT, ALBUMIN, AST, ALT, ALKPHOS, BILITOT in the last 168 hours. HematologyNo results for input(s): WBC, RBC, HGB, HCT, MCV, MCH, MCHC, RDW, PLT in the last 168 hours. Cardiac EnzymesNo results for input(s): TROPONINI in the last 168 hours. No results for input(s): TROPIPOC in the last 168 hours.  BNPNo results for input(s): BNP, PROBNP in the last 168 hours.  DDimer No results for input(s): DDIMER in the last 168 hours.  Radiology/Studies:  No results found.  Assessment and Plan:   Active Problems:   Acute ST elevation myocardial infarction (STEMI) of inferior wall ([facility]) -She is being taken emergently to the Cath Lab with further evaluation and treatment depending on the results. -She will be started on high-dose statin and we will check a lipid profile as well as liver function tests -Blood pressure is currently low so no beta-blocker will be ordered at this time, add as blood pressure and heart rate will tolerate   Severity of Illness: The appropriate patient status for this patient is INPATIENT. Inpatient status is judged to be reasonable and necessary in order to provide the required intensity of service to ensure the patient's safety. The patient's presenting symptoms, physical exam findings, and initial radiographic and laboratory data in the context of their chronic comorbidities is felt to place them at high risk  for further clinical deterioration. Furthermore, it is not anticipated that the patient will be medically stable for discharge from the hospital within 2 midnights of admission. The following factors support the patient status of inpatient.   " The patient's presenting symptoms include chest pain. " The worrisome physical exam findings include abnl ECG . " The initial radiographic and laboratory data are worrisome because of STEMI. " The chronic co-morbidities include OSA.   * I certify that at the point of admission it is my clinical judgment that the patient will require inpatient hospital care spanning beyond 2 midnights from the point of admission due to high intensity of service, high risk for further deterioration and high frequency of surveillance required.*  For questions or updates, please contact [place] Please consult [email or web address] for contact info under Cardiology/STEMI.    Signed, Rosaria Ferries, PA-C  09/26/2017 10:46 AM   Agree with note by Rosaria Ferries PA-C  Ms Anzaldo is a 57 year old divorced Caucasian female without prior cardiac history and no chronic risk factors who was walking [place] this morning and developed sudden onset of substernal chest pain. EMS was called. Her EKG showed inferior ST segment elevation. She was brought to [facility] emergency room where I assessed her. She was brought emergently to the Cath Lab for angiography and potential intervention.   Lorretta Harp, M.D., [place], [facility], [place] [address] [place], [place]  [address]  [phone] 09/26/2017 11:28 AM

## 2017-09-26 NOTE — Progress Notes (Addendum)
Pt has 10 beat run of VT 1816. Pt in bed asymptomatic. Paged Suanne Marker PA with Cardiology to make aware awaiting call back will continue to monitor.   Paged Dr. Gwenlyn Found at [phone] to make aware.

## 2017-09-27 ENCOUNTER — Inpatient Hospital Stay (HOSPITAL_COMMUNITY): Payer: BLUE CROSS/BLUE SHIELD

## 2017-09-27 DIAGNOSIS — E7800 Pure hypercholesterolemia, unspecified: Secondary | ICD-10-CM

## 2017-09-27 LAB — COMPREHENSIVE METABOLIC PANEL
ALT: 16 U/L (ref 14–54)
ANION GAP: 10 (ref 5–15)
AST: 55 U/L — ABNORMAL HIGH (ref 15–41)
Albumin: 3.7 g/dL (ref 3.5–5.0)
Alkaline Phosphatase: 66 U/L (ref 38–126)
BUN: 6 mg/dL (ref 6–20)
CHLORIDE: 104 mmol/L (ref 101–111)
CO2: 24 mmol/L (ref 22–32)
Calcium: 9 mg/dL (ref 8.9–10.3)
Creatinine, Ser: 0.63 mg/dL (ref 0.44–1.00)
GFR calc non Af Amer: >60 mL/min (ref >60)
Glucose, Bld: 91 mg/dL (ref 65–99)
POTASSIUM: 3.8 mmol/L (ref 3.5–5.1)
SODIUM: 138 mmol/L (ref 135–145)
Total Bilirubin: 1.1 mg/dL (ref 0.3–1.2)
Total Protein: 6.4 g/dL — ABNORMAL LOW (ref 6.5–8.1)

## 2017-09-27 LAB — TROPONIN I: Troponin I: 4.56 ng/mL (ref <0.03)

## 2017-09-27 LAB — LIPID PANEL
CHOL/HDL RATIO: 4.6 ratio
Cholesterol: 240 mg/dL — ABNORMAL HIGH (ref 0–200)
HDL: 52 mg/dL (ref >40)
LDL CALC: 176 mg/dL — AB (ref 0–99)
TRIGLYCERIDES: 61 mg/dL (ref <150)
VLDL: 12 mg/dL (ref 0–40)

## 2017-09-27 LAB — ECHOCARDIOGRAM COMPLETE
HEIGHTINCHES: 63 in
WEIGHTICAEL: 2719.59 [oz_av]

## 2017-09-27 LAB — CBC
HCT: 39.3 % (ref 36.0–46.0)
Hemoglobin: 12.8 g/dL (ref 12.0–15.0)
MCH: 28.6 pg (ref 26.0–34.0)
MCHC: 32.6 g/dL (ref 30.0–36.0)
MCV: 87.9 fL (ref 78.0–100.0)
PLATELETS: 181 10*3/uL (ref 150–400)
RBC: 4.47 MIL/uL (ref 3.87–5.11)
RDW: 13.7 % (ref 11.5–15.5)
WBC: 12.1 10*3/uL — ABNORMAL HIGH (ref 4.0–10.5)

## 2017-09-27 LAB — HIV ANTIBODY (ROUTINE TESTING W REFLEX): HIV Screen 4th Generation wRfx: NONREACTIVE

## 2017-09-27 NOTE — Progress Notes (Signed)
Progress Note  Patient Name: Cathy Bell Date of Encounter: 09/27/2017  Primary Cardiologist: Quay Burow, MD   Subjective   Feeling well.  Still has some shortness of breath and mild chest discomfort overnight.   Inpatient Medications    Scheduled Meds: . aspirin  324 mg Oral Once  . aspirin  81 mg Oral Daily  . atorvastatin  80 mg Oral q1800  . sodium chloride flush  3 mL Intravenous Q12H  . ticagrelor  90 mg Oral BID   Continuous Infusions: . sodium chloride     PRN Meds: sodium chloride, acetaminophen, ALPRAZolam, morphine injection, nitroGLYCERIN, ondansetron (ZOFRAN) IV, sodium chloride flush, zolpidem   Vital Signs    Vitals:   09/27/17 0744 09/27/17 0745 09/27/17 0821 09/27/17 0900  BP:  101/65 102/89 (!) 96/56  Pulse:  79  68  Resp:  (!) 25 16 18   Temp: 98 F (36.7 C)     TempSrc: Oral     SpO2:  97% 99% 99%  Weight:      Height:        Intake/Output Summary (Last 24 hours) at 09/27/2017 1102 Last data filed at 09/27/2017 0927 Gross per 24 hour  Intake 1342.7 ml  Output 700 ml  Net 642.7 ml   Filed Weights   09/26/17 1200 09/27/17 0414  Weight: 168 lb 10.4 oz (76.5 kg) 169 lb 15.6 oz (77.1 kg)    Telemetry    Sinus rhythm.  PVCs, NSVT up to 11 beats - Personally Reviewed  ECG    Sinus rhythm rate 61 bpm.  Prior inferior infarct - Personally Reviewed  Physical Exam   VS:  BP 91/69   Pulse 70   Temp 98 F (36.7 C) (Oral)   Resp 16   Ht 5\' 3"  (1.6 m)   Wt 169 lb 15.6 oz (77.1 kg)   LMP 03/18/2011   SpO2 99%   BMI 30.11 kg/m  , BMI Body mass index is 30.11 kg/m. GENERAL:  Well appearing.  No acute distress.  HEENT: Pupils equal round and reactive, fundi not visualized, oral mucosa unremarkable NECK:  No jugular venous distention, waveform within normal limits, carotid upstroke brisk and symmetric, no bruits LUNGS:  Clear to auscultation bilaterally HEART:  RRR.  PMI not displaced or sustained,S1 and S2 within normal  limits, no S3, no S4, no clicks, no rubs, no murmurs ABD:  Flat, positive bowel sounds normal in frequency in pitch, no bruits, no rebound, no guarding, no midline pulsatile mass, no hepatomegaly, no splenomegaly EXT:  2 plus pulses throughout, no edema, no cyanosis no clubbing.  R groin without hematoma or bruit. SKIN:  No rashes no nodules NEURO:  Cranial nerves II through XII grossly intact, motor grossly intact throughout [facility]:  Cognitively intact, oriented to person place and time   Labs    Chemistry Recent Labs  Lab 09/26/17 1023 09/26/17 1049 09/26/17 1811 09/26/17 2334  NA 135 139 138 138  K 3.4* 3.5 3.7 3.8  CL 102 105 107 104  CO2 18*  --  19* 24  GLUCOSE 164* 164* 83 91  BUN 13 15 9 6   CREATININE 0.78 0.70 0.53 0.63  CALCIUM 9.0  --  8.7* 9.0  PROT 7.1  --   --  6.4*  ALBUMIN 4.2  --   --  3.7  AST 22  --   --  55*  ALT 14  --   --  16  ALKPHOS 71  --   --  48  BILITOT 1.1  --   --  1.1  GFRNONAA >60  --  >60 >60  GFRAA >60  --  >60 >60  ANIONGAP 15  --  12 10     Hematology Recent Labs  Lab 09/26/17 1023 09/26/17 1049 09/26/17 2334  WBC 8.8  --  12.1*  RBC 4.67  --  4.47  HGB 13.7 14.6 12.8  HCT 40.7 43.0 39.3  MCV 87.2  --  87.9  MCH 29.3  --  28.6  MCHC 33.7  --  32.6  RDW 13.2  --  13.7  PLT 189  --  181    Cardiac Enzymes Recent Labs  Lab 09/26/17 1023 09/26/17 1246 09/26/17 1811 09/26/17 2334  TROPONINI <0.03 0.91* 3.52* 4.56*    Recent Labs  Lab 09/26/17 1047  TROPIPOC 0.00     BNPNo results for input(s): BNP, PROBNP in the last 168 hours.   DDimer No results for input(s): DDIMER in the last 168 hours.   Radiology    No results found.  Cardiac Studies   LHC 09/26/17:  Mid RCA lesion is 100% stenosed.  Prox LAD lesion is 80% stenosed.  A stent was successfully placed.  Post intervention, there is a 0% residual stenosis.  There is mild left ventricular systolic dysfunction.  LV end diastolic pressure is  normal.  The left ventricular ejection fraction is 45-50% by visual estimate.  Echo pending.   Patient Profile     57 y.o. female with OSA, gestational diabetes and hyperlipidemia here with inferior STEMI.  Assessment & Plan    # CAD s/p STEMI: # Hyperlipidemia: Ms. Boeh underwent RCA PCI with a DES on 3/16.  She also had an 80% proximal LAD lesion.  She is feeling much better but continues to have shortness of breath, had mild CP overnight and NSVT on telemetry.  Unclear is this is due to residual ischemia or ticagrelor and reperfusion. She will be evaluated by Dr. Gwenlyn Found in the am for staged PCI, possibly Monday.  LDL 176 this admission.  Started atorvastatin this admission. Continue aspirin and ticagrelor.  BP is   # NSVT: Noted on telemetry. BP too low for beta blocker.  ?ischemia vs. Reperfusion.  Continue to monitor for now.  For questions or updates, please contact [place] Please consult [email or web address] for contact info under Cardiology/STEMI.      Signed, Skeet Latch, MD  09/27/2017, 11:02 AM

## 2017-09-27 NOTE — Progress Notes (Signed)
  Echocardiogram 2D Echocardiogram has been performed.  Johny Chess 09/27/2017, 10:29 AM

## 2017-09-28 ENCOUNTER — Encounter (HOSPITAL_COMMUNITY): Payer: Self-pay | Admitting: Cardiovascular Disease

## 2017-09-28 DIAGNOSIS — E7849 Other hyperlipidemia: Secondary | ICD-10-CM | POA: Diagnosis present

## 2017-09-28 LAB — TROPONIN I
Troponin I: 1.5 ng/mL (ref <0.03)
Troponin I: 1.5 ng/mL (ref <0.03)

## 2017-09-28 LAB — POCT I-STAT, CHEM 8
BUN: 14 mg/dL (ref 6–20)
CHLORIDE: 103 mmol/L (ref 101–111)
Calcium, Ion: 1.1 mmol/L — ABNORMAL LOW (ref 1.15–1.40)
Creatinine, Ser: 0.5 mg/dL (ref 0.44–1.00)
GLUCOSE: 157 mg/dL — AB (ref 65–99)
HCT: 38 % (ref 36.0–46.0)
Hemoglobin: 12.9 g/dL (ref 12.0–15.0)
POTASSIUM: 3.2 mmol/L — AB (ref 3.5–5.1)
Sodium: 139 mmol/L (ref 135–145)
TCO2: 20 mmol/L — ABNORMAL LOW (ref 22–32)

## 2017-09-28 LAB — POCT ACTIVATED CLOTTING TIME: Activated Clotting Time: 357 seconds

## 2017-09-28 MED FILL — Heparin Sodium (Porcine) 2 Unit/ML in Sodium Chloride 0.9%: INTRAMUSCULAR | Qty: 1000 | Status: AC

## 2017-09-28 MED FILL — Lidocaine HCl Local Inj 1%: INTRAMUSCULAR | Qty: 20 | Status: AC

## 2017-09-28 NOTE — Care Management Note (Signed)
Case Management Note Marvetta Gibbons RN, BSN Unit 4E-Case Manager-- [place] coverage [phone]  Patient Details  Name: KIM OKI MRN: [id number] Date of Birth: [birth year]  Subjective/Objective: Pt admitted with STEMI s/p DES                  Action/Plan: PTA pt lived at home, independent- per pt self employed- referral for [employer] and insurance concerns received- per insurance check copay for Kary Kos is $75- spoke with pt at bedside- copay coverage shared and copay assist card provided- pt states she does not feel like she will have difficulty with cost for med. [facility] uses [facility] [place] discussed concerns about insurance- pt states she has a high deductible plan and was concerned when she came in about needed pre-auth for procedures- she is no longer concerned about this as she only needed the one emergent procedure when she came in. Pt voices no other concerns or needs at this time- CM offered to remain available if pt thinks of something else.   Expected Discharge Date:                  Expected Discharge Plan:  Home/Self Care  In-House Referral:  NA  Discharge planning Services  CM Consult, Medication Assistance  Post Acute Care Choice:    Choice offered to:     DME Arranged:    DME Agency:     HH Arranged:    HH Agency:     Status of Service:  In process, will continue to follow  If discussed at Long Length of Stay Meetings, dates discussed:    Additional Comments:  Dawayne Patricia, RN 09/28/2017, 5:18 PM

## 2017-09-28 NOTE — Significant Event (Signed)
Dr. Gwenlyn Found made aware of patient's SBP in the 80s with MAP > 65. Patient is alert/oriented X 4; denies chest pain or discomfort. Patient states she has been told her blood pressures normally runs on the lower side. No new orders at this time.

## 2017-09-28 NOTE — Progress Notes (Signed)
Progress Note  Patient Name: Cathy Bell Date of Encounter: 09/28/2017  Primary Cardiologist: Quay Burow, MD   Subjective   Patient was resting in her bed today went for entering the room.  She denied chest pain, nausea, indigestion, back pain, neck pain, abdominal pain, myalgias, nausea, or other concerning symptoms at this time.  She stated that she was feeling well overall and had been up to the restroom with some difficulty.  Inpatient Medications    Scheduled Meds: . aspirin  324 mg Oral Once  . aspirin  81 mg Oral Daily  . atorvastatin  80 mg Oral q1800  . sodium chloride flush  3 mL Intravenous Q12H  . ticagrelor  90 mg Oral BID   Continuous Infusions: . sodium chloride     PRN Meds: sodium chloride, acetaminophen, ALPRAZolam, morphine injection, nitroGLYCERIN, ondansetron (ZOFRAN) IV, sodium chloride flush, zolpidem   Vital Signs    Vitals:   09/28/17 0900 09/28/17 0930 09/28/17 1000 09/28/17 1030  BP:   (!) 91/59   Pulse:    85  Resp: 18 13 15 17   Temp:      TempSrc:      SpO2: 95% 98% 98% 100%  Weight:      Height:        Intake/Output Summary (Last 24 hours) at 09/28/2017 1112 Last data filed at 09/28/2017 1016 Gross per 24 hour  Intake 960 ml  Output -  Net 960 ml   Filed Weights   09/26/17 1200 09/27/17 0414 09/28/17 0500  Weight: 76.5 kg (168 lb 10.4 oz) 77.1 kg (169 lb 15.6 oz) 75.4 kg (166 lb 3.6 oz)    Telemetry    One episode of PACs and occasional PVCs otherwise normal sinus rhythm- Personally Reviewed  ECG    Anterolateral T wave inversions consistent with progression of her STEMI.- Personally Reviewed  Physical Exam   GEN: No acute distress.   Neck: No JVD Cardiac: RRR, no murmurs, rubs, or gallops.  Respiratory: Clear to auscultation bilaterally. GI: Soft, nontender, non-distended  MS: No edema; No deformity. Neuro:  Nonfocal  Psych: Normal affect   Labs    Chemistry Recent Labs  Lab 09/26/17 1023  09/26/17 1049 09/26/17 1811 09/26/17 2334  NA 135 139 138 138  K 3.4* 3.5 3.7 3.8  CL 102 105 107 104  CO2 18*  --  19* 24  GLUCOSE 164* 164* 83 91  BUN 13 15 9 6   CREATININE 0.78 0.70 0.53 0.63  CALCIUM 9.0  --  8.7* 9.0  PROT 7.1  --   --  6.4*  ALBUMIN 4.2  --   --  3.7  AST 22  --   --  55*  ALT 14  --   --  16  ALKPHOS 71  --   --  66  BILITOT 1.1  --   --  1.1  GFRNONAA >60  --  >60 >60  GFRAA >60  --  >60 >60  ANIONGAP 15  --  12 10     Hematology Recent Labs  Lab 09/26/17 1023 09/26/17 1049 09/26/17 2334  WBC 8.8  --  12.1*  RBC 4.67  --  4.47  HGB 13.7 14.6 12.8  HCT 40.7 43.0 39.3  MCV 87.2  --  87.9  MCH 29.3  --  28.6  MCHC 33.7  --  32.6  RDW 13.2  --  13.7  PLT 189  --  181    Cardiac Enzymes  Recent Labs  Lab 09/26/17 1023 09/26/17 1246 09/26/17 1811 09/26/17 2334  TROPONINI <0.03 0.91* 3.52* 4.56*    Recent Labs  Lab 09/26/17 1047  TROPIPOC 0.00     BNPNo results for input(s): BNP, PROBNP in the last 168 hours.   DDimer No results for input(s): DDIMER in the last 168 hours.   Radiology    No results found.  Cardiac Studies  LHC 09/26/17:  Mid RCA lesion is 100% stenosed.  Prox LAD lesion is 80% stenosed.  A stent was successfully placed.  Post intervention, there is a 0% residual stenosis.  There is mild left ventricular systolic dysfunction.  LV end diastolic pressure is normal.  The left ventricular ejection fraction is 45-50% by visual estimate.  ECHO 09/27/2017 - Left ventricle: The cavity size was normal. Systolic function was   normal. The estimated ejection fraction was in the range of 55%   to 60%. Wall motion was normal; there were no regional wall   motion abnormalities.  Patient Profile     57 y.o. female 57-year-old female with OSA, gestational diabetes, preeclampsia, chronic hypertension, hyperlipidemia who presented with atypical features for cardiac chest pain noted to have an inferior STEMI.  She  underwent cardiac catheterization with stenting of the proximal to mid RCA with a 3 x 24 mm DES.  There is approximate 70-80% mid LAD lesion that will be addressed at a later time.  Recommended medical management at that time with beta-blocker and a high dose statin.  Assessment & Plan    CAD status post STEMI: Hyperlipidemia: Patient presented with elevated troponin enzyme and anterolateral ischemia with ST elevation most prominent in leads II, III, V3-V6 associated and reciprocal changes in leads I and V2. The patient is a lifelong non-smoker, active with walking approximately 12 miles per week, and consumes a diet consisting primarily of vegetables and low in processed foods.  However, her LDL was noted to be 176 on this admission.  We will reevaluate cath results and discuss possible intervention of the LAD with the patient if amenable.  It is likely that he will continue medical management and address the LAD occlusion at later date if the patient becomes symptomatic.  She remained chest pain free and asymptomatic during the evaluation this a.m and overnight. -Continue atorvastatin 80 mg daily -Continue aspirin and Brilinta on discharge -Continue NTG 0.4 mg sublingual every 5 minutes as needed for chest pain - Stable for transfer to telemetry -Ordered cardiac rehab -Patient will likely be discharged home in 1-2 days pending complete resolution of her symptoms. -She will need continued cardiology follow-up following his admission   Hypotension: Patient states that she is a chronically low systolic blood pressure ranging between 90 and 95.  In addition she states supporting evidence that her second pregnancy should she was diagnosed with preeclampsia however her blood pressure never exceeded approximately 120 at that time.  As such, given her stable condition at this blood pressure, there is no indication for intervention. -Continue to hold beta-blocker and ACE inhibitor given  hypotension  Insomnia: -Continue Ambien 5 mg daily  Anxiety: -Continue alprazolam 0.25 twice daily as needed for anxiety  For questions or updates, please contact [facility] Please consult [email or web address] for contact info under Cardiology/STEMI.   Please see attending note/attestation for current assessment and plan.    Signed, Kathi Ludwig, MD  09/28/2017, 11:12 AM   Pager: [phone]

## 2017-09-28 NOTE — Progress Notes (Signed)
Per insurance check on Brilinta  Co-pay for Brilinta 90 mg.BID $75.00 .for 30 day supply

## 2017-09-28 NOTE — Progress Notes (Signed)
CARDIAC REHAB PHASE I   PRE:  Rate/Rhythm: 92 SR  BP:  Supine:   Sitting: 107/90  Standing:    SaO2: 99%RA  MODE:  Ambulation: 370 ft   POST:  Rate/Rhythm: 97 SR  BP:  Supine:   Sitting: 101/72  Standing:    SaO2: 100%RA 1330-1435 Pt walked 370 ft on RA with steady gait and no CP. Tolerated well. MI education completed with pt who voiced understanding. Stressed importance of brilinta with stent. Needs to see case manager. Reviewed MI restrictions,  NTG use, ex ed, gave heart healthy diet, and CRP 2. Will refer to [facility] program CRP 2. Pt has been to see 4 nutritionists and adheres to strict diet and gets regular ex.    Graylon Good, RN BSN  09/28/2017 2:30 PM

## 2017-09-29 ENCOUNTER — Telehealth: Payer: Self-pay | Admitting: Cardiology

## 2017-09-29 MED ORDER — NITROGLYCERIN 0.4 MG SL SUBL: 0.4000 mg | SUBLINGUAL_TABLET | SUBLINGUAL | 3 refills | Status: DC | PRN

## 2017-09-29 MED ORDER — ASPIRIN 81 MG PO CHEW: 81.0000 mg | CHEWABLE_TABLET | Freq: Every day | ORAL | 3 refills | Status: AC

## 2017-09-29 MED ORDER — ATORVASTATIN CALCIUM 80 MG PO TABS: 80.0000 mg | ORAL_TABLET | Freq: Every day | ORAL | 3 refills | Status: DC

## 2017-09-29 MED ORDER — TICAGRELOR 90 MG PO TABS: 90.0000 mg | ORAL_TABLET | Freq: Two times a day (BID) | ORAL | 3 refills | Status: DC

## 2017-09-29 MED ORDER — TICAGRELOR 90 MG PO TABS: 90.0000 mg | ORAL_TABLET | Freq: Two times a day (BID) | ORAL | 0 refills | Status: DC

## 2017-09-29 NOTE — Progress Notes (Signed)
Progress Note  Patient Name: Cathy Bell Date of Encounter: 09/29/2017  Primary Cardiologist: Quay Burow, MD   Subjective   Patient feels well. No chest pain. Some sense of indigestion. Ambulating in halls.   Inpatient Medications    Scheduled Meds: . aspirin  81 mg Oral Daily  . atorvastatin  80 mg Oral q1800  . sodium chloride flush  3 mL Intravenous Q12H  . ticagrelor  90 mg Oral BID   Continuous Infusions: . sodium chloride     PRN Meds: sodium chloride, acetaminophen, ALPRAZolam, morphine injection, nitroGLYCERIN, ondansetron (ZOFRAN) IV, sodium chloride flush, zolpidem   Vital Signs    Vitals:   09/28/17 2134 09/29/17 0111 09/29/17 0601 09/29/17 0825  BP: 101/63 (!) 90/53 101/60 (!) 121/58  Pulse: 81 83 70 (!) 104  Resp: 18  16 16   Temp: 98.2 F (36.8 C) 98 F (36.7 C) 97.8 F (36.6 C) 98.2 F (36.8 C)  TempSrc: Oral Oral Oral Oral  SpO2: 98% 99% 98% 97%  Weight:   166 lb 3.2 oz (75.4 kg)   Height:        Intake/Output Summary (Last 24 hours) at 09/29/2017 1022 Last data filed at 09/29/2017 0827 Gross per 24 hour  Intake 1340 ml  Output 1500 ml  Net -160 ml   Filed Weights   09/28/17 0500 09/28/17 1819 09/29/17 0601  Weight: 166 lb 3.6 oz (75.4 kg) 167 lb 6.4 oz (75.9 kg) 166 lb 3.2 oz (75.4 kg)    Telemetry    NSR- Personally Reviewed  ECG    None today.- Personally Reviewed  Physical Exam   GEN: No acute distress.   Neck: No JVD Cardiac: RRR, no murmurs, rubs, or gallops. Groin cath site OK.  Respiratory: Clear to auscultation bilaterally. GI: Soft, nontender, non-distended  MS: No edema; No deformity. Neuro:  Nonfocal  Psych: Normal affect   Labs    Chemistry Recent Labs  Lab 09/26/17 1023  09/26/17 1051 09/26/17 1811 09/26/17 2334  NA 135   < > 139 138 138  K 3.4*   < > 3.2* 3.7 3.8  CL 102   < > 103 107 104  CO2 18*  --   --  19* 24  GLUCOSE 164*   < > 157* 83 91  BUN 13   < > 14 9 6   CREATININE 0.78   < >  0.50 0.53 0.63  CALCIUM 9.0  --   --  8.7* 9.0  PROT 7.1  --   --   --  6.4*  ALBUMIN 4.2  --   --   --  3.7  AST 22  --   --   --  55*  ALT 14  --   --   --  16  ALKPHOS 71  --   --   --  66  BILITOT 1.1  --   --   --  1.1  GFRNONAA >60  --   --  >60 >60  GFRAA >60  --   --  >60 >60  ANIONGAP 15  --   --  12 10   < > = values in this interval not displayed.     Hematology Recent Labs  Lab 09/26/17 1023 09/26/17 1049 09/26/17 1051 09/26/17 2334  WBC 8.8  --   --  12.1*  RBC 4.67  --   --  4.47  HGB 13.7 14.6 12.9 12.8  HCT 40.7 43.0 38.0 39.3  MCV  87.2  --   --  87.9  MCH 29.3  --   --  28.6  MCHC 33.7  --   --  32.6  RDW 13.2  --   --  13.7  PLT 189  --   --  181    Cardiac Enzymes Recent Labs  Lab 09/26/17 1811 09/26/17 2334 09/28/17 1547 09/28/17 2245  TROPONINI 3.52* 4.56* 1.50* 1.50*    Recent Labs  Lab 09/26/17 1047  TROPIPOC 0.00     BNPNo results for input(s): BNP, PROBNP in the last 168 hours.   DDimer No results for input(s): DDIMER in the last 168 hours.   Radiology    No results found.  Cardiac Studies  LHC 09/26/17:  Mid RCA lesion is 100% stenosed.  Prox LAD lesion is 80% stenosed.  A stent was successfully placed.  Post intervention, there is a 0% residual stenosis.  There is mild left ventricular systolic dysfunction.  LV end diastolic pressure is normal.  The left ventricular ejection fraction is 45-50% by visual estimate.  ECHO 09/27/2017 - Left ventricle: The cavity size was normal. Systolic function was   normal. The estimated ejection fraction was in the range of 55%   to 60%. Wall motion was normal; there were no regional wall   motion abnormalities.  Patient Profile     57 y.o. female 72-year-old female with OSA, gestational diabetes, preeclampsia, chronic hypertension, hyperlipidemia who presented with atypical features for cardiac chest pain noted to have an inferior STEMI.  She underwent cardiac catheterization  with stenting of the proximal to mid RCA with a 3 x 24 mm DES.  There is approximate 70-80% mid LAD lesion that will be addressed at a later time.  Recommended medical management at that time with beta-blocker and a high dose statin.  Assessment & Plan    CAD status post STEMI: s/p DES of RCA. Good LV function. No recurrent angina. Stable for DC today. Not a candidate for beta blocker due to chronically low BP. On DAPT and statin. Patient has residual moderate disease in the LAD that will be treated medically for now. Monitor for recurrent angina.  -Continue atorvastatin 80 mg daily -Continue aspirin and Brilinta on discharge -Continue NTG 0.4 mg sublingual every 5 minutes as needed for chest pain - Stable for transfer to telemetry -Ordered cardiac rehab -Patient will likely be discharged home today -She will need continued cardiology follow-up following his admission Dr. Gwenlyn Found  Hypotension: Patient states that she is a chronically low systolic blood pressure ranging between 90 and 95.  In addition she states supporting evidence that her second pregnancy should she was diagnosed with preeclampsia however her blood pressure never exceeded approximately 120 at that time.  As such, given her stable condition at this blood pressure, there is no indication for intervention. -Continue to hold beta-blocker and ACE inhibitor given hypotension  Hypercholesterolemia. LDL 176 on admission. Now on high dose statin. Follow up labs in 2 months.   For questions or updates, please contact [place] Please consult [email or web address] for contact info under Cardiology/STEMI.   Please see attending note/attestation for current assessment and plan.    Signed, Peter Martinique, MD  09/29/2017, 10:22 AM

## 2017-09-29 NOTE — Discharge Instructions (Signed)
PLEASE REMEMBER TO BRING ALL OF YOUR MEDICATIONS TO EACH OF YOUR FOLLOW-UP OFFICE VISITS.  PLEASE ATTEND ALL SCHEDULED FOLLOW-UP APPOINTMENTS.   Activity: Increase activity slowly as tolerated. You may shower, but no soaking baths (or swimming) for 1 week. No driving for 24 hours. No lifting over 5 lbs for 1 week. No sexual activity for 1 week.   You May Return to Work: in 1 week (if applicable)  Wound Care: You may wash cath site gently with soap and water. Keep cath site clean and dry. If you notice pain, swelling, bleeding or pus at your cath site, please call [phone].   It is very important that you do not miss any doses of BRILINTA - doing so will increase your risk of blockages forming in your stent, causing another heart attack.

## 2017-09-29 NOTE — Telephone Encounter (Signed)
New Message    TOC appt made for 10/20/2017 at 8:30am with Kerin Ransom.

## 2017-09-29 NOTE — Progress Notes (Signed)
Patient ready for discharge to home. Sister with patient and will provide transportation. All discharge instructions, meds, new meds, follow up appts reviewed with patient.  Patient voices no c/o discomfort at this time, she is alert and oriented.

## 2017-09-29 NOTE — Clinical Social Work Note (Signed)
CSW acknowledges consult regarding insurance issues. RNCM followed up on this yesterday.  CSW signing off. Consult again if any social work needs arise.  Cathy Bell, [place]

## 2017-09-29 NOTE — Telephone Encounter (Signed)
Patient is not yet discharged.  Tentative first [facility] outreach attempt should be 09/30/17, pending discharge date

## 2017-09-29 NOTE — Discharge Summary (Signed)
Discharge Summary    Patient ID: Cathy Bell,  MRN: [id number], DOB/AGE: 58-May-1962 58 y.o.  Admit date: 09/26/2017 Discharge date: 09/29/2017  Primary Care Provider: Tommy Bell Primary Cardiologist: Cathy Burow, MD  Discharge Diagnoses    Principal Problem:   Acute ST elevation myocardial infarction (STEMI) of inferior wall [facility]) Active Problems:   Acute ST elevation myocardial infarction (STEMI) of inferior wall involving right ventricle (HCC)   STEMI (ST elevation myocardial infarction) ([place])   Hypercholesterolemia   Allergies Allergies  Allergen Reactions  . Cefprozil Hives  . Cheese Hives  . Peanut-Containing Drug Products Hives  . Salmon [Fish Allergy] Hives  . Wellbutrin [Bupropion Hcl] Hives    Diagnostic Studies/Procedures    LHC 09/26/17:  Mid RCA lesion is 100% stenosed.  Prox LAD lesion is 80% stenosed.  A stent was successfully placed.  Post intervention, there is a 0% residual stenosis.  There is mild left ventricular systolic dysfunction.  LV end diastolic pressure is normal.  The left ventricular ejection fraction is 45-50% by visual estimate.  Post-Intervention Diagram        ECHO 09/27/2017 - Left ventricle: The cavity size was normal. Systolic function was normal. The estimated ejection fraction was in the range of 55% to 60%. Wall motion was normal; there were no regional wall motion abnormalities.   _____________   History of Present Illness     Cathy Bell was in her usual state of health today (09/26/17), walking in a local park.  She was with her friend.  She developed back pain, sudden urge to have a bowel movement and had a bowel movement in the woods.  After that, she developed substernal chest pain with radiation to her left arm.  She was short of breath.  The pain was severe.  EMS was called.  The chest pain was 8/10.  She had an inferior STEMI by ECG.  She was given 324 mg of aspirin and an IV was  started for systolic blood pressure 95.  She [facility].  In the emergency room, she was given heparin.  Her chest pain was waxing and waning, alternating between a 2/10 and an 8/10.  She was short of breath with this.  She felt very cold.  The pain was radiating to her left arm.  She has never had anything like this before.  Her general health is good, she walks regularly for exercise and tries to watch what she eats.  She had gestational diabetes with 1 of her children (now a teenager), but not since.    Hospital Course     Consultants: None   1. CAD s/p STEMI: presented with sudden onset substernal chest pain. EKG with anterolateral ST elevations. Troponin peaked at 4.56. LHC revealed 100% stenosis of mRCA and 80% stenosis of pLAD. She had successful PCI/DES to [facility]. Recommended for DAPT and medical management of residual moderate LAD disease. Echo with EF 55-60% and no wall motion abnormalities - Discharged home on ASA, brilinta, atorvastatin, and SL nitro prn.  - Unable to start BBlocker due to chronically low BP.   2. Hypotension: chronically low BP with typical SBP rage 90-95.  - Unable to start BBlocker or ACEi due to hypotension - can reconsider if BP status changes.  3. Hypercholesterolemia: LDL 176 this admission - Started on high dose statin - will need LFTs checked in 6-8 weeks.  _____________  Discharge Vitals Blood pressure (!) 121/58, pulse (!) 104,  temperature 98.2 F (36.8 C), temperature source Oral, resp. rate 16, height 5\' 3"  (1.6 m), weight 166 lb 3.2 oz (75.4 kg), last menstrual period 03/18/2011, SpO2 97 %.  Filed Weights   09/28/17 0500 09/28/17 1819 09/29/17 0601  Weight: 166 lb 3.6 oz (75.4 kg) 167 lb 6.4 oz (75.9 kg) 166 lb 3.2 oz (75.4 kg)    Labs & Radiologic Studies    CBC Recent Labs    09/26/17 2334  WBC 12.1*  HGB 12.8  HCT 39.3  MCV 87.9  PLT 902   Basic Metabolic Panel Recent Labs    09/26/17 1811  09/26/17 2334  NA 138 138  K 3.7 3.8  CL 107 104  CO2 19* 24  GLUCOSE 83 91  BUN 9 6  CREATININE 0.53 0.63  CALCIUM 8.7* 9.0  MG 1.9  --    Liver Function Tests Recent Labs    09/26/17 2334  AST 55*  ALT 16  ALKPHOS 66  BILITOT 1.1  PROT 6.4*  ALBUMIN 3.7   No results for input(s): LIPASE, AMYLASE in the last 72 hours. Cardiac Enzymes Recent Labs    09/26/17 2334 09/28/17 1547 09/28/17 2245  TROPONINI 4.56* 1.50* 1.50*   BNP Invalid input(s): POCBNP D-Dimer No results for input(s): DDIMER in the last 72 hours. Hemoglobin A1C Recent Labs    09/26/17 1246  HGBA1C 5.6   Fasting Lipid Panel Recent Labs    09/26/17 2334  CHOL 240*  HDL 52  LDLCALC 176*  TRIG 61  CHOLHDL 4.6   Thyroid Function Tests Recent Labs    09/26/17 1246  TSH 0.961   _____________  No results found. Disposition   Patient was seen and examined by Cathy Bell who deemed patient as stable for discharge. Follow-up has been arranged. Discharge medications as listed below.   Follow-up Plans & Appointments    Follow-up Information    Cathy Quan, PA-C Follow up on 10/20/2017.   Specialties:  Cardiology, Radiology Why:  Please arrive 15 minutes early for your 8:30am Cardiology appoitnment Contact information: [address] [place] [phone]          Discharge Instructions    AMB Referral to Cardiac Rehabilitation - Phase II   Complete by:  As directed    Diagnosis:  STEMI   Amb Referral to Cardiac Rehabilitation   Complete by:  As directed    Diagnosis:   STEMI Coronary Stents     Diet - low sodium heart healthy   Complete by:  As directed    Increase activity slowly   Complete by:  As directed       Discharge Medications   Allergies as of 09/29/2017      Reactions   Cefprozil Hives   Cheese Hives   Peanut-containing Drug Products Hives   Salmon [fish Allergy] Hives   Wellbutrin [bupropion Hcl] Hives      Medication List     TAKE these medications   aspirin 81 MG chewable tablet Chew 1 tablet (81 mg total) by mouth daily. Start taking on:  09/30/2017   atorvastatin 80 MG tablet Commonly known as:  LIPITOR Take 1 tablet (80 mg total) by mouth daily at 6 PM.   Cranberry 1000 MG Caps Take 1 capsule by mouth daily as needed (yeast infection).   multivitamin tablet Take 1 tablet by mouth daily.   nitroGLYCERIN 0.4 MG SL tablet Commonly known as:  NITROSTAT Place 1 tablet (0.4 mg total)  under the tongue every 5 (five) minutes x 3 doses as needed for chest pain.   sodium chloride 2 % ophthalmic solution Commonly known as:  MURO 128 Place 1 drop into both eyes 3 (three) times daily.   SYSTANE ULTRA 0.4-0.3 % Soln Generic drug:  Polyethyl Glycol-Propyl Glycol Apply 1 drop to eye at bedtime.   ticagrelor 90 MG Tabs tablet Commonly known as:  BRILINTA Take 1 tablet (90 mg total) by mouth 2 (two) times daily.   ticagrelor 90 MG Tabs tablet Commonly known as:  BRILINTA Take 1 tablet (90 mg total) by mouth 2 (two) times daily.        Aspirin prescribed at discharge?  Yes High Intensity Statin Prescribed? (Lipitor 40-80mg  or Crestor 20-40mg ): Yes Beta Blocker Prescribed? No: hypotension  For EF <40%, was ACEI/ARB Prescribed? No: hypotension ADP Receptor Inhibitor Prescribed? (i.e. Plavix etc.-Includes Medically Managed Patients): Yes For EF <40%, Aldosterone Inhibitor Prescribed? No: EF 55-60% Was EF assessed during THIS hospitalization? Yes Was Cardiac Rehab II ordered? (Included Medically managed Patients): Yes   Outstanding Labs/Studies   Will need LFTs checked in 6-8 weeks for monitoring statin therapy  Duration of Discharge Encounter   Greater than 30 minutes including physician time.  Signed, Abigail Butts PA-C 09/29/2017, 11:15 AM

## 2017-09-30 NOTE — Telephone Encounter (Signed)
Patient contacted regarding discharge from [facility] 09/29/17.  Patient understands to follow up with provider [facility] PA on [place] at 10/20/17 at [facility]. Patient understands discharge instructions? yes  Patient understands medications and regiment? yes Patient understands to bring all medications to this visit? yes

## 2017-10-05 ENCOUNTER — Telehealth (HOSPITAL_COMMUNITY): Payer: Self-pay

## 2017-10-05 NOTE — Telephone Encounter (Signed)
Patients insurance is active and benefits verified through [place] - No co-pay, deductible amount of $6,750/$3,572.26 has been met, out of pocket amount of $6,750/$3,572.26 has been met, no co-insurance, and no pre-authorization is required. Passport/reference [id number]  Will contact patient to see if interested in the CR program. If interested patient will need to complete follow up appt. Once completed, patient will be contacted for scheduling upon review by the RN Navigator.

## 2017-10-06 ENCOUNTER — Telehealth: Payer: Self-pay | Admitting: Cardiology

## 2017-10-06 NOTE — Telephone Encounter (Signed)
New Message  Pt c/o medication issue:  1. Name of Medication: ticagrelor (BRILINTA) 90 MG TABS tablet, atorvastatin (LIPITOR) 80 MG tablet, and aspirin  2. How are you currently taking this medication (dosage and times per day)? Take 1 tablet (90 mg total) by mouth 2 (two) times daily, Take 1 tablet (80 mg total) by mouth daily at 6 PM. And  Chew 1 tablet (81 mg total) by mouth daily 3. Are you having a reaction (difficulty breathing--STAT)?yes, sweating, nausea, and indesgestion  4. What is your medication issue? Pt states she is having a reaction and not sure if its caused by the medications she is on. Please call

## 2017-10-06 NOTE — Telephone Encounter (Signed)
Patient called w/recommendations & voiced understanding. Will route to Dr. Gwenlyn Found who did LHC + stent for any additional recommendations.

## 2017-10-06 NOTE — Telephone Encounter (Signed)
Returned call to patient who is s/p cath/stent 3/16  Brilinta, ASA, atorvastatin are all new medications  She takes lipitor @ 8:30pm (about 1 hour before bedtime).  She reports indigestion after every meal for a few days Last night, about 1 hour after taking her meds, she was very nauseous/stomachache/felt like she would throw up She had similar GI symptoms prior to MI, but these symptoms the last few days were lower in her abdomen She is having belching issues, feeling flushed, muscle aches  She is also taking a hormone supplement that she has been weaning off this since October. Added to list.  She tends to have low BP, does not monitor BP/HR at home.   Advised would route to [facility] for review of medications/advice on SE and she would be notified accordingly.

## 2017-10-06 NOTE — Telephone Encounter (Signed)
Please try taking Aspirin and Atorvastatin with biggest meal to decrease potential for GI irritation.   Continue all other medication as prescribed. Can re-assess in 10 days during f/u visit.

## 2017-10-07 ENCOUNTER — Telehealth (HOSPITAL_COMMUNITY): Payer: Self-pay

## 2017-10-07 DIAGNOSIS — H1850 Unspecified hereditary corneal dystrophies: Secondary | ICD-10-CM | POA: Diagnosis not present

## 2017-10-07 NOTE — Telephone Encounter (Signed)
Called and spoke with patient to see if interested in Cardiac Rehab - Patient is interested. Explained scheduling process to patient and patient stated she understands. Went over insurance and patient verbally stated she understands. Will call to schedule after follow up appt has been completed.

## 2017-10-14 ENCOUNTER — Encounter: Payer: BLUE CROSS/BLUE SHIELD | Admitting: Gastroenterology

## 2017-10-20 ENCOUNTER — Ambulatory Visit (INDEPENDENT_AMBULATORY_CARE_PROVIDER_SITE_OTHER): Payer: BLUE CROSS/BLUE SHIELD | Admitting: Cardiology

## 2017-10-20 ENCOUNTER — Encounter: Payer: Self-pay | Admitting: Cardiovascular Disease

## 2017-10-20 ENCOUNTER — Encounter: Payer: Self-pay | Admitting: Cardiology

## 2017-10-20 DIAGNOSIS — R079 Chest pain, unspecified: Secondary | ICD-10-CM | POA: Diagnosis not present

## 2017-10-20 DIAGNOSIS — I2510 Atherosclerotic heart disease of native coronary artery without angina pectoris: Secondary | ICD-10-CM

## 2017-10-20 DIAGNOSIS — E785 Hyperlipidemia, unspecified: Secondary | ICD-10-CM | POA: Diagnosis not present

## 2017-10-20 DIAGNOSIS — I2119 ST elevation (STEMI) myocardial infarction involving other coronary artery of inferior wall: Secondary | ICD-10-CM | POA: Diagnosis not present

## 2017-10-20 DIAGNOSIS — Z9861 Coronary angioplasty status: Secondary | ICD-10-CM

## 2017-10-20 NOTE — Assessment & Plan Note (Signed)
Pt with exertional back-chest discomfort since her recent hospitalization

## 2017-10-20 NOTE — Assessment & Plan Note (Signed)
RCA PCI with DES in setting of inferior STEMI Residual 70-80% LAD

## 2017-10-20 NOTE — Patient Instructions (Addendum)
NO MEDICATION CHANGES     TEST [address] has requested that you have en exercise stress myoview. For further information please visit [email or web address]. Please follow instruction sheet, as given.     Your physician recommends that you schedule a follow-up appointment in 2 [place]. ( April 30 , 2019)  .

## 2017-10-20 NOTE — Progress Notes (Signed)
10/20/2017 [facility]   31-Oct-1960  [id number]  Primary Physician Yong Channel Brayton Mars, MD Primary Cardiologist: Dr Gwenlyn Found  HPI:  Pleasant 57 y/o female with no prior history of CAD or MI, presented to [facility] 09/26/17 with chest pin that developed while she was walking in the park. She ruled in for an inferior STEMI. Cath showed mRCA occlusion-treated with PCI/DES. She had residual 80% LAD after the Dx1. Post MI she did well, her EF was 45-50% by cath and 55-60% by echo. Her B/P was too low for a beta blocker or an ACE. She is in the office today for follow up. She admits to exertional mid back pain that radiates around to her chest. This is reproducible walking un an incline. No associated jaw pain or diaphoresis but she does admit to some associated SOB.  Current Outpatient Medications  Medication Sig Dispense Refill  . aspirin 81 MG chewable tablet Chew 1 tablet (81 mg total) by mouth daily. 90 tablet 3  . atorvastatin (LIPITOR) 80 MG tablet Take 1 tablet (80 mg total) by mouth daily at 6 PM. 90 tablet 3  . Cholecalciferol (VITAMIN D PO) Take 5,000 Units by mouth daily.    . Cranberry 1000 MG CAPS Take 1 capsule by mouth daily as needed (yeast infection).    . Estradiol-Norethindrone Acet 0.5-0.1 MG tablet Take 1 tablet by mouth every other day.    . Multiple Vitamin (MULTIVITAMIN) tablet Take 1 tablet by mouth daily.    . nitroGLYCERIN (NITROSTAT) 0.4 MG SL tablet Place 1 tablet (0.4 mg total) under the tongue every 5 (five) minutes x 3 doses as needed for chest pain. 25 tablet 3  . OVER THE COUNTER MEDICATION Take 500 mg by mouth daily. D-Mannose    . Polyethyl Glycol-Propyl Glycol (SYSTANE ULTRA) 0.4-0.3 % SOLN Apply 1 drop to eye at bedtime.      . Probiotic Product (PROBIOTIC ADVANCED PO) Take by mouth.    . sodium chloride (MURO 128) 2 % ophthalmic solution Place 1 drop into both eyes 3 (three) times daily.     . ticagrelor (BRILINTA) 90 MG TABS tablet Take 1 tablet (90 mg total) by  mouth 2 (two) times daily. 180 tablet 3   No current facility-administered medications for this visit.     Allergies  Allergen Reactions  . Bupropion Hives    Other reaction(s): Unknown  . Cefprozil Hives  . Cefprozil Hives and Other (See Comments)  . Cheese Hives  . Peanut-Containing Drug Products Hives  . Salmon [Fish Allergy] Hives  . Wellbutrin [Bupropion Hcl] Hives    Past Medical History:  Diagnosis Date  . Sleep apnea 2007   uses c pap on occasion    Social History   Socioeconomic History  . Marital status: Divorced    Spouse name: Not on file  . Number of children: Not on file  . Years of education: Not on file  . Highest education level: Not on file  Occupational History  . Not on file  Social Needs  . Financial resource strain: Not on file  . Food insecurity:    Worry: Not on file    Inability: Not on file  . Transportation needs:    Medical: Not on file    Non-medical: Not on file  Tobacco Use  . Smoking status: Never Smoker  . Smokeless tobacco: Never Used  Substance and Sexual Activity  . Alcohol use: No  . Drug use: No  . Sexual  activity: Not on file  Lifestyle  . Physical activity:    Days per week: Not on file    Minutes per session: Not on file  . Stress: Not on file  Relationships  . Social connections:    Talks on phone: Not on file    Gets together: Not on file    Attends religious service: Not on file    Active member of club or organization: Not on file    Attends meetings of clubs or organizations: Not on file    Relationship status: Not on file  . Intimate partner violence:    Fear of current or ex partner: Not on file    Emotionally abused: Not on file    Physically abused: Not on file    Forced sexual activity: Not on file  Other Topics Concern  . Not on file  Social History Narrative  . Not on file     Family History  Problem Relation Age of Onset  . Colon cancer Father      Review of Systems: General: negative  for chills, fever, night sweats or weight changes.  Cardiovascular: negative for chest pain, dyspnea on exertion, edema, orthopnea, palpitations, paroxysmal nocturnal dyspnea or shortness of breath Dermatological: negative for rash Respiratory: negative for cough or wheezing Urologic: negative for hematuria Abdominal: negative for nausea, vomiting, diarrhea, bright red blood per rectum, melena, or hematemesis Neurologic: negative for visual changes, syncope, or dizziness All other systems reviewed and are otherwise negative except as noted above.    Blood pressure 118/64, pulse 67, height 5\' 3"  (1.6 m), weight 164 lb (74.4 kg), last menstrual period 03/18/2011.  General appearance: alert, cooperative and no distress Neck: no carotid bruit and no JVD Lungs: clear to auscultation bilaterally Heart: regular rate and rhythm Abdomen: not distended Extremities: extremities normal, atraumatic, no cyanosis or edema Pulses: 2+ and symmetric Skin: Skin color, texture, turgor normal. No rashes or lesions Neurologic: Grossly normal  EKG NSR, inferior Qs  ASSESSMENT AND PLAN:   Exertional chest pain Pt with exertional back-chest discomfort since her recent hospitalization   CAD S/P percutaneous coronary angioplasty RCA PCI with DES in setting of inferior STEMI Residual 70-80% LAD  Acute ST elevation myocardial infarction (STEMI) of inferior wall (HCC) Pt presented 09/26/17 with an acute inferior STEMI. Door to balloon time 30 minutes.   Dyslipidemia, goal LDL below 70 On Lipitor 80 mg   PLAN  Pt was seen by Dr Gwenlyn Found and myself. Plan is for a [facility] Myoview, then f/u with Dr Gwenlyn Found 11/10/17.   Kerin Ransom PA-C 10/20/2017 12:44 PM

## 2017-10-20 NOTE — Assessment & Plan Note (Signed)
On Lipitor 80 mg 

## 2017-10-20 NOTE — Assessment & Plan Note (Signed)
Pt presented 09/26/17 with an acute inferior STEMI. Door to balloon time 30 minutes.

## 2017-10-22 ENCOUNTER — Ambulatory Visit: Payer: BLUE CROSS/BLUE SHIELD | Admitting: Family Medicine

## 2017-10-22 ENCOUNTER — Telehealth (HOSPITAL_COMMUNITY): Payer: Self-pay

## 2017-10-22 NOTE — Telephone Encounter (Signed)
Encounter complete. 

## 2017-10-23 ENCOUNTER — Telehealth (HOSPITAL_COMMUNITY): Payer: Self-pay

## 2017-10-23 NOTE — Telephone Encounter (Signed)
Attempted to call patient to follow her up on where we are with getting her scheduled - lm on vm

## 2017-10-27 ENCOUNTER — Ambulatory Visit (HOSPITAL_COMMUNITY)
Admission: RE | Admit: 2017-10-27 | Discharge: 2017-10-27 | Disposition: A | Discharge status: Home / Self Care | Payer: BLUE CROSS/BLUE SHIELD | Source: Ambulatory Visit | Attending: Cardiology | Admitting: Cardiology

## 2017-10-27 DIAGNOSIS — Z9861 Coronary angioplasty status: Secondary | ICD-10-CM | POA: Insufficient documentation

## 2017-10-27 DIAGNOSIS — I2119 ST elevation (STEMI) myocardial infarction involving other coronary artery of inferior wall: Secondary | ICD-10-CM | POA: Diagnosis not present

## 2017-10-27 DIAGNOSIS — I2510 Atherosclerotic heart disease of native coronary artery without angina pectoris: Secondary | ICD-10-CM | POA: Diagnosis not present

## 2017-10-27 LAB — MYOCARDIAL PERFUSION IMAGING
Estimated workload: 10.1 METS
Exercise duration (min): 8 min
Exercise duration (sec): 1 s
LV dias vol: 73 mL (ref 46–106)
LV sys vol: 26 mL
MPHR: 163 {beats}/min
Peak HR: 148 {beats}/min
Percent HR: 90 %
RPE: 17
Rest HR: 67 {beats}/min
SDS: 1
SRS: 2
SSS: 3
TID: 1.45

## 2017-10-27 MED ORDER — TECHNETIUM TC 99M TETROFOSMIN IV KIT
10.4000 | PACK | Freq: Once | INTRAVENOUS | Status: AC | PRN
  Administered 2017-10-27: 10.4 via INTRAVENOUS
  Filled 2017-10-27: qty 11

## 2017-10-27 MED ORDER — TECHNETIUM TC 99M TETROFOSMIN IV KIT
32.0000 | PACK | Freq: Once | INTRAVENOUS | Status: AC | PRN
  Administered 2017-10-27: 32 via INTRAVENOUS
  Filled 2017-10-27: qty 32

## 2017-11-03 ENCOUNTER — Encounter: Payer: Self-pay | Admitting: Family Medicine

## 2017-11-03 ENCOUNTER — Ambulatory Visit (INDEPENDENT_AMBULATORY_CARE_PROVIDER_SITE_OTHER): Payer: BLUE CROSS/BLUE SHIELD | Admitting: Family Medicine

## 2017-11-03 VITALS — BP 122/78 | HR 79 | Temp 98.5°F | Ht 63.0 in | Wt 166.2 lb

## 2017-11-03 DIAGNOSIS — G939 Disorder of brain, unspecified: Secondary | ICD-10-CM | POA: Insufficient documentation

## 2017-11-03 DIAGNOSIS — E663 Overweight: Secondary | ICD-10-CM | POA: Diagnosis not present

## 2017-11-03 DIAGNOSIS — Z23 Encounter for immunization: Secondary | ICD-10-CM | POA: Diagnosis not present

## 2017-11-03 DIAGNOSIS — E785 Hyperlipidemia, unspecified: Secondary | ICD-10-CM | POA: Diagnosis not present

## 2017-11-03 DIAGNOSIS — L719 Rosacea, unspecified: Secondary | ICD-10-CM | POA: Insufficient documentation

## 2017-11-03 DIAGNOSIS — E042 Nontoxic multinodular goiter: Secondary | ICD-10-CM | POA: Diagnosis not present

## 2017-11-03 DIAGNOSIS — I2510 Atherosclerotic heart disease of native coronary artery without angina pectoris: Secondary | ICD-10-CM

## 2017-11-03 DIAGNOSIS — Z8632 Personal history of gestational diabetes: Secondary | ICD-10-CM | POA: Insufficient documentation

## 2017-11-03 DIAGNOSIS — Z1159 Encounter for screening for other viral diseases: Secondary | ICD-10-CM

## 2017-11-03 DIAGNOSIS — Z9861 Coronary angioplasty status: Secondary | ICD-10-CM | POA: Diagnosis not present

## 2017-11-03 NOTE — Assessment & Plan Note (Signed)
S: hopefully controlled on atorvastatin 80 mg Lab Results  Component Value Date   CHOL 240 (H) 09/26/2017   HDL 52 09/26/2017   LDLCALC 176 (H) 09/26/2017   TRIG 61 09/26/2017   CHOLHDL 4.6 09/26/2017   A/P: Has been 5 weeks since starting statin-will update LDL today.

## 2017-11-03 NOTE — Patient Instructions (Addendum)
Health Maintenance Due  Topic Date Due  . Hepatitis C Screening - Today at office visit. Please stop by lab before you go. Since we are drawing blood- I am also going to peek in at your "bad cholesterol" since you have been on medicine 6 weeks- as well as kidney, blood counts, thyroid now that you are out of the hospital  [birth year]  . TETANUS/TDAP - today at office visit 12/18/1978  . PAP SMEAR - Our team will call [facility] and request the records. 06/01/2014  . COLONOSCOPY - Patient is on blood thinners due to heart attack March 16th, 2019- will need to do this when comes off brilinta 08/04/2016   No changes in medicine today  I would like to see you back in 4-8 weeks. Please bring a chart of caloric intake each day. I think limiting to 1500 calories or so is reasonable. Keep up your exercise. I would consider even 1-2 lbs weight loss a big success. If not making progress or gaining may do some further lab investigations, refer to nutrition or get Dr. Alcario Drought opinion on medication to use.

## 2017-11-03 NOTE — Assessment & Plan Note (Addendum)
S: Patient reports difficulty losing weight despite regular exercise 6 days a week(currently walking 41 minutes a day- wants to be released back to yoga) and healthy diet.  She has had some significant setbacks in recent months between her eye surgery and now STEMI in last month.  She is up 2 pounds over the month.  She does a lot of travel with work and Eats well when home when can control it. With travel is very difficult.  She tries to bring something with her but this is not always possible and food is often brought-things like [employer].   High veggie intake. Rare fruit. No dairy in 25 years. No alcohol in 30 years. Low red meat. Eats rather restricted diet- had lost 40 lbs in the past (2011) and gained back (after stroke in son- eating out more) - had done 30g carbs per meal - 100 g carbs per day. Wants to have smaller waist line.   Lately has tried more beans but concerned about carbs intake. She has seen a nutritionist. Protein bothers stomachs though some- but has done ok with beans. States has had stomach issues whole life - was even a colicky baby  31-SH recall  Breakfast- sauteed veggies with pam- zuchinni, spinach, celery, carrots, organic tofu or egg beaters. Water to drink.  Snack- slice of toasted raisin bread (16 carbs) no butter. Decaf tea Lunch- salad- mixed greens and broccoli slaw. No dressing. Chicken salad with low fat mayo. Water to drink.  Snack- 88 % dark chocolate- 1 column of bar Dinner- asian phoenix- wox stir fried flounder- no coating on flounder. Some brown rice. Water.    A/P: She agreed to do a journal over the next 1-2 months.  1-2 lbs would be a success. She will track her calories.  In the past she has done 1200-calorie diets.  I discussed potentially relaxing to 1500 cal.  Depending on findings of the next month we will consider referral to our nutritionist Inda Coke vs. I would discuss options with Dr. Juleen China as far as weight loss medication.

## 2017-11-03 NOTE — Assessment & Plan Note (Addendum)
S: Patient is compliant with ASA, statin, brilinta after receiving stent about a month ago after inferior STEMI.  She has Residual 70-80% LAD narrowing.  Even prior to her in STEMI, has felt like cant do things fast and gets some fatigued towards end of workout. Post the stent placement she did have some pain in her chest with inclines which has apparently now largely resolved.   Luckily patient has been taking off of hormone replacement A/P: Continue current medications.  Patient is still experiencing some shortness of breath. She does have some residual Narrowing of the LAD.  I would ask for Dr. Kennon Holter opinion on medication like Imdur.  For shortness of breath portion we also discussed potentially doing an x-ray at follow-up.

## 2017-11-03 NOTE — Assessment & Plan Note (Signed)
Has seen [facility] in the past. Had seen Dr. Chalmers Cater prior to that. Has had biopsy before with [facility]- told benign.  Will update TSH with labs now that she is not in hospital

## 2017-11-03 NOTE — Progress Notes (Signed)
Phone: [phone]  Subjective:  Patient presents today to establish care.  Prior patient of Tommy Medal, MD - he moved from [place]. Chief complaint-noted.   See problem oriented charting  The following were reviewed and entered/updated in epic: Past Medical History:  Diagnosis Date  . Acute ST elevation myocardial infarction (STEMI) of inferior wall ([place]) 09/26/2017   Pt presented 09/26/17 with an acute inferior STEMI. Door to balloon time 30 minutes.   Marland Kitchen Heart disease   . Hyperlipidemia   . Sleep apnea 2007   uses c pap on occasion   Patient Active Problem List   Diagnosis Date Noted  . CAD S/P percutaneous coronary angioplasty 10/20/2017    Priority: High  . History of gestational diabetes 11/03/2017    Priority: Medium  . Dyslipidemia, goal LDL below 70     Priority: Medium  . Brain lesion 11/03/2017    Priority: Low  . Multiple thyroid nodules 11/03/2017    Priority: Low  . Rosacea 11/03/2017    Priority: Low  . Exertional chest pain 10/20/2017    Priority: Low  . FATIGUE, CHRONIC 03/10/2007    Priority: Low  . Overweight (BMI 25.0-29.9) 11/03/2017   Past Surgical History:  Procedure Laterality Date  . CESAREAN SECTION  [id number]  . CORONARY/GRAFT ACUTE MI REVASCULARIZATION N/A 09/26/2017   Procedure: Coronary/Graft Acute MI Revascularization;  Surgeon: Lorretta Harp, MD;  Location: [facility];  Service: Cardiovascular;  Laterality: N/A;  . LEFT HEART CATH AND CORONARY ANGIOGRAPHY N/A 09/26/2017   Procedure: LEFT HEART CATH AND CORONARY ANGIOGRAPHY;  Surgeon: Lorretta Harp, MD;  Location: [facility];  Service: Cardiovascular;  Laterality: N/A;  . nodulectomy     Salzmann Nodular Degeneration  . STENT PLACEMENT VASCULAR ([place] [facility])  2019  . TONSILLECTOMY AND ADENOIDECTOMY  1967  . WISDOM TOOTH EXTRACTION  1981    Family History  Problem Relation Age of Onset  . Colon cancer Father        healthy until 61s then developed    . Atrial fibrillation Father        pacemaker  . Hyperlipidemia Mother   . Other Mother        pacemaker. she is very short stature. healthy until 5s  . Breast cancer Sister        stage 0   . Hashimoto's thyroiditis Sister   . Stroke Son        due to PFO  . Diabetes Maternal Grandfather     Medications- reviewed and updated Current Outpatient Medications  Medication Sig Dispense Refill  . aspirin 81 MG chewable tablet Chew 1 tablet (81 mg total) by mouth daily. 90 tablet 3  . atorvastatin (LIPITOR) 80 MG tablet Take 1 tablet (80 mg total) by mouth daily at 6 PM. 90 tablet 3  . Cholecalciferol (VITAMIN D PO) Take 5,000 Units by mouth daily.    . Cranberry 1000 MG CAPS Take 1 capsule by mouth daily as needed (yeast infection).    . nitroGLYCERIN (NITROSTAT) 0.4 MG SL tablet Place 1 tablet (0.4 mg total) under the tongue every 5 (five) minutes x 3 doses as needed for chest pain. 25 tablet 3  . OVER THE COUNTER MEDICATION Take 500 mg by mouth daily. D-Mannose    . Probiotic Product (PROBIOTIC ADVANCED PO) Take by mouth.    . sodium chloride (MURO 128) 5 % ophthalmic solution Place 1 drop into both eyes 3 (three) times daily.     Marland Kitchen  ticagrelor (BRILINTA) 90 MG TABS tablet Take 1 tablet (90 mg total) by mouth 2 (two) times daily. 180 tablet 3   No current facility-administered medications for this visit.     Allergies-reviewed and updated Allergies  Allergen Reactions  . Bupropion Hives    Other reaction(s): Unknown  . Cefprozil Hives  . Cefprozil Hives and Other (See Comments)  . Cheese Hives  . Peanut-Containing Drug Products Hives  . Prednisolone     Caused dangerously high eye pressure around time of eye surgery  . Salmon [Fish Allergy] Hives  . Wellbutrin [Bupropion Hcl] Hives    Social History   Social History Narrative   Divorced. 2 sons [name] 26 and [name]. No grandkids yet.       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  HENT: Negative for ear discharge and ear pain.   Eyes: Negative for blurred vision and double vision.  Respiratory: Positive for shortness of breath. Negative for cough.   Cardiovascular: Positive for chest pain. Negative for palpitations.  Gastrointestinal: Negative for abdominal pain and vomiting.  Genitourinary: Negative for dysuria and urgency.  Musculoskeletal: Negative for falls and myalgias.  Skin: Negative for itching and rash (has noted some bumps on skin that come and go).  Neurological: Negative for tremors and speech change.  Endo/Heme/Allergies: Negative for polydipsia. Does not bruise/bleed easily.  Psychiatric/Behavioral: Negative for hallucinations and substance abuse.   Objective: BP 122/78 (BP Location: Left Arm, Patient Position: Sitting, Cuff Size: Normal)   Pulse 79   Temp 98.5 F (36.9 C) (Oral)   Ht 5\' 3"  (1.6 m)   Wt 166 lb 3.2 oz (75.4 kg)   LMP 03/18/2011   SpO2 98%   BMI 29.44 kg/m  Gen: NAD, resting comfortably HEENT: Mucous membranes are moist. Oropharynx normal. TM normal. Eyes: sclera and lids normal, PERRLA Neck:  no cervical lymphadenopathy, no pronounced visual thyromegaly CV: RRR no murmurs rubs or gallops Lungs: CTAB no crackles, wheeze, rhonchi Abdomen: soft/nontender/nondistended/normal bowel sounds. No rebound or guarding.  Ext: no edema Skin: warm, dry Neuro: 5/5 strength in upper and lower extremities, normal gait, normal reflexes  Assessment/Plan:  Other notes:  1.2013 colonoscopy- [facility]. Needs follow up but will need to Delay until 2020 due to brilinta 2. 2005- tdap- today will give repeat 3. Pap fall  2018- [facility] getting records 4. She wants me to be aware of string of prior illnesses in recent years 2016-yeast infections, e coli infection- still couldn't get well 2017- bronchitis, pneumonia 2018- eye surgery  CAD S/P percutaneous coronary angioplasty S: Patient is compliant with ASA, statin, brilinta after receiving stent about a month ago after inferior STEMI.  She has Residual 70-80% LAD narrowing.  Even prior to her in STEMI, has felt like cant do things fast and gets some fatigued towards end of workout. Post the stent placement she did have some pain in her chest with inclines which has apparently now largely resolved.   Luckily patient has been taking off of hormone replacement A/P: Continue current medications.  Patient is still experiencing some shortness of breath. She does have some residual Narrowing of the LAD.  I would ask for Dr. Kennon Holter opinion on medication like Imdur.  For shortness of breath portion we also discussed potentially doing an x-ray at follow-up.  Dyslipidemia, goal LDL below 70 S: hopefully controlled on atorvastatin 80 mg Lab Results  Component Value Date   CHOL 240 (H) 09/26/2017   HDL 52 09/26/2017   LDLCALC 176 (H) 09/26/2017   TRIG 61 09/26/2017   CHOLHDL 4.6 09/26/2017   A/P: Has been 5 weeks since starting statin-will update LDL today.   Multiple thyroid nodules Has seen [facility] in the past. Had seen Dr. Chalmers Cater prior to that. Has had biopsy before with [facility]- told benign.  Will update TSH with labs now that she is not in hospital  Overweight (BMI 25.0-29.9) S: Patient reports difficulty losing weight despite regular exercise 6 days a week(currently walking 41 minutes a day- wants to be released back to yoga) and healthy diet.  She has had some significant setbacks in recent months between her eye surgery and now STEMI in last month.  She is up 2 pounds over the month.  She does a lot of travel with work and Eats well when home when can  control it. With travel is very difficult.  She tries to bring something with her but this is not always possible and food is often brought-things like [employer].   High veggie intake. Rare fruit. No dairy in 25 years. No alcohol in 30 years. Low red meat. Eats rather restricted diet- had lost 40 lbs in the past (2011) and gained back (after stroke in son- eating out more) - had done 30g carbs per meal - 100 g carbs per day. Wants to have smaller waist line.   Lately has tried more beans but concerned about carbs intake. She has seen a nutritionist. Protein bothers stomachs though some- but has done ok with beans. States has had stomach issues whole life - was even a colicky baby  75-ZW recall  Breakfast- sauteed veggies with pam- zuchinni, spinach, celery, carrots, organic tofu or egg beaters. Water to drink.  Snack- slice of toasted raisin bread (16 carbs) no butter. Decaf tea Lunch- salad- mixed greens and broccoli slaw. No dressing. Chicken salad with low fat mayo. Water to drink.  Snack- 88 % dark chocolate- 1 column of bar Dinner- asian phoenix- wox stir fried flounder- no coating on flounder. Some brown rice. Water.    A/P: She agreed to do a journal over the next 1-2 months.  1-2 lbs would be a success. She will track her calories.  In the past she has done 1200-calorie diets.  I discussed potentially relaxing to 1500 cal.  Depending on findings of the next month we will consider referral to our nutritionist Inda Coke vs. I would discuss options with Dr. Juleen China as far as weight loss medication.   Future Appointments  Date Time Provider [place]  11/10/2017  3:00 PM Lorretta Harp, MD [facility]  12/08/2017  1:45 PM Marin Olp, MD [facility]    Lab/Order associations: Encounter for hepatitis C screening test for low risk patient - Plan: Hepatitis C Antibody  Need for prophylactic vaccination with combined diphtheria-tetanus-pertussis (DTP) vaccine  - Plan: Tdap vaccine greater than or equal to 7yo IM  Dyslipidemia, goal LDL below 70 - Plan: CBC with Differential/Platelet, Comprehensive metabolic panel, LDL cholesterol, direct, TSH  Time Stamp The duration of face-to-face time during this visit was greater than 60 minutes (1:50 PM- 2:51 PM). Greater  than 50% of this time was spent in counseling, explanation of diagnosis, planning of further management, and/or coordination of care including discussion of difficulty with weight gain and challenges associated, discussion of health maintenance needs, reviewing prior records including discharge summaries, comforting patient who has been through tremendous last few months.    Return precautions advised. Garret Reddish, MD

## 2017-11-04 LAB — CBC WITH DIFFERENTIAL/PLATELET
BASOS ABS: 0.1 10*3/uL (ref 0.0–0.1)
BASOS PCT: 0.6 % (ref 0.0–3.0)
EOS ABS: 0.9 10*3/uL — AB (ref 0.0–0.7)
Eosinophils Relative: 7.5 % — ABNORMAL HIGH (ref 0.0–5.0)
HEMATOCRIT: 38.5 % (ref 36.0–46.0)
Hemoglobin: 12.6 g/dL (ref 12.0–15.0)
LYMPHS ABS: 2.2 10*3/uL (ref 0.7–4.0)
LYMPHS PCT: 19.5 % (ref 12.0–46.0)
MCHC: 32.7 g/dL (ref 30.0–36.0)
MCV: 87.4 fl (ref 78.0–100.0)
MONO ABS: 0.8 10*3/uL (ref 0.1–1.0)
Monocytes Relative: 6.9 % (ref 3.0–12.0)
NEUTROS ABS: 7.4 10*3/uL (ref 1.4–7.7)
NEUTROS PCT: 65.5 % (ref 43.0–77.0)
PLATELETS: 225 10*3/uL (ref 150.0–400.0)
RBC: 4.4 Mil/uL (ref 3.87–5.11)
RDW: 13.9 % (ref 11.5–15.5)
WBC: 11.3 10*3/uL — ABNORMAL HIGH (ref 4.0–10.5)

## 2017-11-04 LAB — COMPREHENSIVE METABOLIC PANEL
ALT: 85 U/L — AB (ref 0–35)
AST: 37 U/L (ref 0–37)
Albumin: 4.4 g/dL (ref 3.5–5.2)
Alkaline Phosphatase: 242 U/L — ABNORMAL HIGH (ref 39–117)
BILIRUBIN TOTAL: 0.5 mg/dL (ref 0.2–1.2)
BUN: 18 mg/dL (ref 6–23)
CALCIUM: 9.5 mg/dL (ref 8.4–10.5)
CHLORIDE: 103 meq/L (ref 96–112)
CO2: 26 meq/L (ref 19–32)
CREATININE: 0.69 mg/dL (ref 0.40–1.20)
GFR: 92.92 mL/min (ref >60.00)
GLUCOSE: 92 mg/dL (ref 70–99)
Potassium: 4.3 mEq/L (ref 3.5–5.1)
SODIUM: 138 meq/L (ref 135–145)
Total Protein: 7.1 g/dL (ref 6.0–8.3)

## 2017-11-04 LAB — TSH: TSH: 1.16 u[IU]/mL (ref 0.35–4.50)

## 2017-11-04 LAB — HEPATITIS C ANTIBODY
Hepatitis C Ab: NONREACTIVE
SIGNAL TO CUT-OFF: 0.02 (ref <1.00)

## 2017-11-04 LAB — LDL CHOLESTEROL, DIRECT: Direct LDL: 91 mg/dL

## 2017-11-04 NOTE — Progress Notes (Signed)
I would like to have patient back for repeat CMP (under high alkaline phosphatase and high/elevated alt) and cbc with differential (under leukocytosis) in 2 weeks. There were a few things slightly off including alkaline phosphatase (which can be associated with liver issues, bone issues, or could possibly be up in relation to recent heart attack). One of other liver function tests is a hair high as well but most of the liver tests are reassuring. My hope is that labs normalize in that time.   Another option would be for her to come in to discuss within a week or two and we could order labs at that time.   Her cholesterol has significantly improved and is down to 91 for bad cholesterol. Ideally we will get this down to under 70. She is on max dose statin so would not recommend further change right now   Blood counts are largely normal other than infection fighting cells being slightly high.  The distribution of these point towards her allergies being a problem for her right now.  Thyroid is normal.  Hepatitis C normal

## 2017-11-10 ENCOUNTER — Encounter: Payer: Self-pay | Admitting: Cardiovascular Disease

## 2017-11-10 ENCOUNTER — Ambulatory Visit (INDEPENDENT_AMBULATORY_CARE_PROVIDER_SITE_OTHER): Payer: BLUE CROSS/BLUE SHIELD | Admitting: Cardiovascular Disease

## 2017-11-10 DIAGNOSIS — I2510 Atherosclerotic heart disease of native coronary artery without angina pectoris: Secondary | ICD-10-CM

## 2017-11-10 DIAGNOSIS — Z9861 Coronary angioplasty status: Secondary | ICD-10-CM

## 2017-11-10 DIAGNOSIS — E785 Hyperlipidemia, unspecified: Secondary | ICD-10-CM

## 2017-11-10 NOTE — Assessment & Plan Note (Signed)
History of CAD status post inferior wall myocardial infarction 09/26/2017 status post drug-eluting stenting of her RCA with a synergy 3 x 20 mm long drug-eluting stent.  She had moderate inferobasal hypokinesia which improved by 2D echo the following day.  He did have an 80% mid LAD lesion with a subsequent negative stress test.

## 2017-11-10 NOTE — Patient Instructions (Signed)
Medication Instructions: Your physician recommends that you continue on your current medications as directed. Please refer to the Current Medication list given to you today.   Follow-Up: You have been referred to Dr. Debara Pickett in [facility].  You have also been referred to Cardiac Rehab.  Your physician recommends that you schedule a follow-up appointment in: 3 months with Dr. Gwenlyn Found.   If you need a refill on your cardiac medications before your next appointment, please call your pharmacy.

## 2017-11-10 NOTE — Progress Notes (Signed)
11/10/2017 Cathy Bell   [birth year]  [id number]  Primary Physician Yong Channel Brayton Mars, MD Primary Cardiologist: Lorretta Harp MD FACP, [place], [place], [place]  HPI:  Cathy Bell is a 57 y.o. moderately overweight divorced Caucasian female presented on 09/26/2017 with chest pain while walking in the park.  EKG showed inferior ST segment elevation with reciprocal anterior depression.  I brought her to the Cath Lab on Saturday morning and opened up an occluded mid RCA with a synergy drug-eluting stent.  She did have inferobasal hypokinesia which improved by 2D echo the following day.  She did have an 80% mid LAD lesion with a subsequent negative Myoview stress test.  Her major complaints are of fatigue.  Her other problems include hyperlipidemia on high-dose statin therapy with mildly elevated liver function tests.  She is on dual antiplatelet therapy with Brilinta.   Current Meds  Medication Sig  . aspirin 81 MG chewable tablet Chew 1 tablet (81 mg total) by mouth daily.  Marland Kitchen atorvastatin (LIPITOR) 80 MG tablet Take 1 tablet (80 mg total) by mouth daily at 6 PM.  . Cholecalciferol (VITAMIN D PO) Take 5,000 Units by mouth daily.  . Cranberry 1000 MG CAPS Take 1 capsule by mouth daily as needed (yeast infection).  . nitroGLYCERIN (NITROSTAT) 0.4 MG SL tablet Place 1 tablet (0.4 mg total) under the tongue every 5 (five) minutes x 3 doses as needed for chest pain.  Marland Kitchen OVER THE COUNTER MEDICATION Take 500 mg by mouth daily. D-Mannose  . Probiotic Product (PROBIOTIC ADVANCED PO) Take by mouth.  . sodium chloride (MURO 128) 5 % ophthalmic solution Place 1 drop into both eyes 3 (three) times daily.   . ticagrelor (BRILINTA) 90 MG TABS tablet Take 1 tablet (90 mg total) by mouth 2 (two) times daily.     Allergies  Allergen Reactions  . Bupropion Hives    Other reaction(s): Unknown  . Cefprozil Hives  . Cefprozil Hives and Other (See Comments)  . Cheese Hives  . Peanut-Containing Drug  Products Hives  . Prednisolone     Caused dangerously high eye pressure around time of eye surgery  . Salmon [Fish Allergy] Hives  . Wellbutrin [Bupropion Hcl] Hives    Social History   Socioeconomic History  . Marital status: Divorced    Spouse name: Not on file  . Number of children: Not on file  . Years of education: Not on file  . Highest education level: Not on file  Occupational History  . Occupation: Optometrist  Social Needs  . Financial resource strain: Not on file  . Food insecurity:    Worry: Not on file    Inability: Not on file  . Transportation needs:    Medical: Not on file    Non-medical: Not on file  Tobacco Use  . Smoking status: Never Smoker  . Smokeless tobacco: Never Used  Substance and Sexual Activity  . Alcohol use: No  . Drug use: No  . Sexual activity: Not Currently  Lifestyle  . Physical activity:    Days per week: Not on file    Minutes per session: Not on file  . Stress: Not on file  Relationships  . Social connections:    Talks on phone: Not on file    Gets together: Not on file    Attends religious service: Not on file    Active member of club or organization: Not on file    Attends meetings of  clubs or organizations: Not on file    Relationship status: Not on file  . Intimate partner violence:    Fear of current or ex partner: Not on file    Emotionally abused: Not on file    Physically abused: Not on file    Forced sexual activity: Not on file  Other Topics Concern  . Not on file  Social History Narrative   Divorced. 2 sons Cathy Bell 26 and Cathy Bell. No grandkids yet.       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  Cardiovascular: negative for chest pain, dyspnea on exertion, edema, orthopnea, palpitations, paroxysmal nocturnal dyspnea or shortness of breath Dermatological: negative for rash Respiratory: negative for cough or wheezing Urologic: negative for hematuria Abdominal: negative for nausea, vomiting, diarrhea, bright red blood per rectum, melena, or hematemesis Neurologic: negative for visual changes, syncope, or dizziness All other systems reviewed and are otherwise negative except as noted above.    Blood pressure 102/73, pulse 71, height 5\' 2"  (1.575 m), weight 164 lb 6.4 oz (74.6 kg), last menstrual period 03/18/2011.  General appearance: alert and no distress Neck: no adenopathy, no carotid bruit, no JVD, supple, symmetrical, trachea midline and thyroid not enlarged, symmetric, no tenderness/mass/nodules Lungs: clear to auscultation bilaterally Heart: regular rate and rhythm, S1, S2 normal, no murmur, click, rub or gallop Extremities: extremities normal, atraumatic, no cyanosis or edema Pulses: 2+ and symmetric Skin: Skin color, texture, turgor normal. No rashes or lesions Neurologic: Alert and oriented X 3, normal strength and tone. Normal symmetric reflexes. Normal coordination and gait  EKG not performed today  ASSESSMENT AND PLAN:   Dyslipidemia, goal LDL below 70 History  of hyperlipidemia direct LDL measured 7 days ago of 91 which is significantly improved from prior readings on high-dose statin therapy.  Her liver function tests are markedly elevated as well.  Referring her to Dr. Debara Pickett to sort this out and decide whether is a candidate for Repatha.  CAD S/P percutaneous coronary angioplasty History of CAD status post inferior wall myocardial infarction 09/26/2017 status post drug-eluting stenting of her RCA with a synergy 3 x 20 mm long drug-eluting stent.  She had moderate inferobasal hypokinesia which improved by 2D echo the following day.  He did have an 80% mid LAD  lesion with a subsequent negative stress test.      Lorretta Harp MD [facility], [facility] 11/10/2017 4:12 PM

## 2017-11-10 NOTE — Assessment & Plan Note (Signed)
History  of hyperlipidemia direct LDL measured 7 days ago of 91 which is significantly improved from prior readings on high-dose statin therapy.  Her liver function tests are markedly elevated as well.  Referring her to Dr. Debara Pickett to sort this out and decide whether is a candidate for Repatha.

## 2017-11-12 ENCOUNTER — Other Ambulatory Visit: Payer: Self-pay

## 2017-11-12 ENCOUNTER — Telehealth: Payer: Self-pay | Admitting: Family Medicine

## 2017-11-12 ENCOUNTER — Telehealth: Payer: Self-pay

## 2017-11-12 DIAGNOSIS — R7401 Elevation of levels of liver transaminase levels: Secondary | ICD-10-CM

## 2017-11-12 DIAGNOSIS — R740 Nonspecific elevation of levels of transaminase and lactic acid dehydrogenase [LDH]: Secondary | ICD-10-CM

## 2017-11-12 DIAGNOSIS — D72829 Elevated white blood cell count, unspecified: Secondary | ICD-10-CM

## 2017-11-12 DIAGNOSIS — R748 Abnormal levels of other serum enzymes: Secondary | ICD-10-CM

## 2017-11-12 NOTE — Telephone Encounter (Signed)
Called patient and left a voicemail to call patient back. CRM has been created. We are unable to email patient's lab results.

## 2017-11-12 NOTE — Telephone Encounter (Signed)
Called patient and left a message to call office back.  

## 2017-11-12 NOTE — Telephone Encounter (Signed)
Copied from [place] [phone]. Topic: Quick Communication - See Telephone Encounter >> Nov 12, 2017 10:24 AM Ahmed Prima L wrote: CRM for notification. See Telephone encounter for: 11/12/17.  Patient said she just missed a call about labs. Please call @ [phone] She said please email them to her because calling back and forth takes too long

## 2017-11-13 ENCOUNTER — Telehealth: Payer: Self-pay

## 2017-11-13 NOTE — Telephone Encounter (Signed)
Called patient about labs at the number she requested a call to. [phone]). Patient didn't answer, I left a voicemail stating to call our office back. We are unable to email labs but she can view her labs on MyChart anytime.

## 2017-11-13 NOTE — Telephone Encounter (Signed)
Called patient about labs at the number she requested a call to. [phone]). Patient didn't answer, I left a voicemail stating to call our office back. We are unable to email labs but she can view her labs on MyChart anytime.

## 2017-11-17 ENCOUNTER — Telehealth: Payer: Self-pay

## 2017-11-17 NOTE — Telephone Encounter (Signed)
Called patient and scheduled her a lab visit for Thursday May 9th. Patient already viewed her lab results. Depending on the results of her lab work she will come in sooner if needed. Her next appointment is May 28th. Patient verbalized understanding.

## 2017-11-17 NOTE — Telephone Encounter (Signed)
Clicked telephone call by accident

## 2017-11-18 ENCOUNTER — Encounter: Payer: Self-pay | Admitting: Cardiology

## 2017-11-18 ENCOUNTER — Encounter: Payer: Self-pay | Admitting: Family Medicine

## 2017-11-19 ENCOUNTER — Telehealth (HOSPITAL_COMMUNITY): Payer: Self-pay

## 2017-11-19 ENCOUNTER — Other Ambulatory Visit (INDEPENDENT_AMBULATORY_CARE_PROVIDER_SITE_OTHER): Payer: BLUE CROSS/BLUE SHIELD

## 2017-11-19 DIAGNOSIS — R740 Nonspecific elevation of levels of transaminase and lactic acid dehydrogenase [LDH]: Secondary | ICD-10-CM | POA: Diagnosis not present

## 2017-11-19 DIAGNOSIS — R748 Abnormal levels of other serum enzymes: Secondary | ICD-10-CM | POA: Diagnosis not present

## 2017-11-19 DIAGNOSIS — D72829 Elevated white blood cell count, unspecified: Secondary | ICD-10-CM

## 2017-11-19 DIAGNOSIS — R7401 Elevation of levels of liver transaminase levels: Secondary | ICD-10-CM

## 2017-11-19 NOTE — Telephone Encounter (Signed)
Called patient to schedule Cardiac Rehab - Scheduled orientation on 01/12/18 at 8:30am. Patient will attend the 11:15am exc class. Mailed packet.

## 2017-11-20 ENCOUNTER — Encounter: Payer: Self-pay | Admitting: Family Medicine

## 2017-11-20 LAB — COMPREHENSIVE METABOLIC PANEL
ALBUMIN: 4.4 g/dL (ref 3.5–5.2)
ALT: 48 U/L — AB (ref 0–35)
AST: 29 U/L (ref 0–37)
Alkaline Phosphatase: 160 U/L — ABNORMAL HIGH (ref 39–117)
BILIRUBIN TOTAL: 0.6 mg/dL (ref 0.2–1.2)
BUN: 15 mg/dL (ref 6–23)
CHLORIDE: 101 meq/L (ref 96–112)
CO2: 31 mEq/L (ref 19–32)
Calcium: 9.5 mg/dL (ref 8.4–10.5)
Creatinine, Ser: 0.68 mg/dL (ref 0.40–1.20)
GFR: 94.48 mL/min (ref >60.00)
Glucose, Bld: 117 mg/dL — ABNORMAL HIGH (ref 70–99)
Potassium: 4.1 mEq/L (ref 3.5–5.1)
SODIUM: 139 meq/L (ref 135–145)
TOTAL PROTEIN: 7.1 g/dL (ref 6.0–8.3)

## 2017-11-20 LAB — CBC WITH DIFFERENTIAL/PLATELET
Basophils Absolute: 0.1 10*3/uL (ref 0.0–0.1)
Basophils Relative: 0.6 % (ref 0.0–3.0)
EOS ABS: 0.5 10*3/uL (ref 0.0–0.7)
Eosinophils Relative: 5.6 % — ABNORMAL HIGH (ref 0.0–5.0)
HCT: 38.4 % (ref 36.0–46.0)
HEMOGLOBIN: 12.7 g/dL (ref 12.0–15.0)
LYMPHS PCT: 21.2 % (ref 12.0–46.0)
Lymphs Abs: 1.9 10*3/uL (ref 0.7–4.0)
MCHC: 33.2 g/dL (ref 30.0–36.0)
MCV: 87.1 fl (ref 78.0–100.0)
MONO ABS: 0.7 10*3/uL (ref 0.1–1.0)
Monocytes Relative: 8.1 % (ref 3.0–12.0)
Neutro Abs: 5.9 10*3/uL (ref 1.4–7.7)
Neutrophils Relative %: 64.5 % (ref 43.0–77.0)
Platelets: 209 10*3/uL (ref 150.0–400.0)
RBC: 4.41 Mil/uL (ref 3.87–5.11)
RDW: 14.1 % (ref 11.5–15.5)
WBC: 9.1 10*3/uL (ref 4.0–10.5)

## 2017-11-20 NOTE — Progress Notes (Signed)
Great news.  Both of the liver tests are trending down and approaching normal levels.  I know we have a follow-up later this month and we can recheck at that time.  Elevated white blood cells/infection fighting cells have normalized as well.

## 2017-11-23 ENCOUNTER — Encounter: Payer: Self-pay | Admitting: Cardiology

## 2017-12-08 ENCOUNTER — Ambulatory Visit (INDEPENDENT_AMBULATORY_CARE_PROVIDER_SITE_OTHER): Payer: BLUE CROSS/BLUE SHIELD | Admitting: Family Medicine

## 2017-12-08 ENCOUNTER — Encounter: Payer: Self-pay | Admitting: Family Medicine

## 2017-12-08 VITALS — BP 110/76 | HR 78 | Temp 98.6°F | Ht 62.0 in | Wt 162.0 lb

## 2017-12-08 DIAGNOSIS — Z9861 Coronary angioplasty status: Secondary | ICD-10-CM

## 2017-12-08 DIAGNOSIS — E785 Hyperlipidemia, unspecified: Secondary | ICD-10-CM

## 2017-12-08 DIAGNOSIS — E663 Overweight: Secondary | ICD-10-CM | POA: Diagnosis not present

## 2017-12-08 DIAGNOSIS — I2510 Atherosclerotic heart disease of native coronary artery without angina pectoris: Secondary | ICD-10-CM | POA: Diagnosis not present

## 2017-12-08 DIAGNOSIS — N951 Menopausal and female climacteric states: Secondary | ICD-10-CM

## 2017-12-08 NOTE — Patient Instructions (Addendum)
Health Maintenance Due  Topic Date Due  . PAP SMEAR - Our team will call and request these results. 06/01/2014  . COLONOSCOPY - Patient stated she can not have it until she gets off her stent medication. 08/04/2016   Lets check in some time around august or September. Once again- lets set a goal of perhaps 2-5 lbs off.

## 2017-12-08 NOTE — Assessment & Plan Note (Signed)
LDL above 91 on max dose statin. Appears she has been referred to lipid clinic under Dr. Lysbeth Penner direction but I do not see that follow up visit charted yet.

## 2017-12-08 NOTE — Assessment & Plan Note (Signed)
S: since stopping HRT- has had hot flashes that can wake her from sleep A/P: We discussed herbal options and lack of good evidence as well as some of her contradictions due to her medical history.  She does not really want to take an additional medication.  She does reflect back when I asked about acupuncture and realizes that was helpful in the past-she is considering restarting this

## 2017-12-08 NOTE — Assessment & Plan Note (Signed)
S: Getting further out from her stent-her shortness of breath and fatigue issues have much improved.  She is able to take a deep breath and feel like she can catch her breath as well.  She remains on aspirin, statin, Brilinta. A/P: Continue current medications.  Patient plans to proceed with cardiac rehab which I think is a great idea.  He is eager to increase her exercise and I think this will guide her how to safely do that.

## 2017-12-08 NOTE — Assessment & Plan Note (Addendum)
S:  patient was to bring in a chart of calorie intake daily. Calorie goal of 1500 calories. She was exercising well previously and has continued that ranging from 20-45 minutes if not more per day.   Her calorie chart easily averages under 1500 calories- she states the issue is continuing that when her travel increases with work  [facility] Readings from Last 3 Encounters:  12/08/17 162 lb (73.5 kg)  11/10/17 164 lb 6.4 oz (74.6 kg)  11/03/17 166 lb 3.2 oz (75.4 kg)  A/P: I congratulated patient on her continued progress-She is down 4 pounds since last visit.  I encouraged her to continue her regular exercise in the 1500-calorie or less diet.  She wishes she had made more progress but I told her I pound a week is very reasonable.  I think if she could get to 150 over the next year that would be excellent progress.  Goal for next visit is 2 to 5  pounds

## 2017-12-08 NOTE — Progress Notes (Signed)
Subjective:  Cathy Bell is a 57 y.o. year old very pleasant female patient who presents for/with See problem oriented charting ROS-has had intentional weight loss.  Shortness of breath issues have resolved.  Fatigue issues have improved.  No chest pain with exertion.  Past Medical History-  Patient Active Problem List   Diagnosis Date Noted  . CAD S/P percutaneous coronary angioplasty 10/20/2017    Priority: High  . Hot flashes due to menopause 12/08/2017    Priority: Medium  . History of gestational diabetes 11/03/2017    Priority: Medium  . Dyslipidemia, goal LDL below 70     Priority: Medium  . Brain lesion 11/03/2017    Priority: Low  . Multiple thyroid nodules 11/03/2017    Priority: Low  . Rosacea 11/03/2017    Priority: Low  . Exertional chest pain 10/20/2017    Priority: Low  . FATIGUE, CHRONIC 03/10/2007    Priority: Low  . Overweight (BMI 25.0-29.9) 11/03/2017    Medications- reviewed and updated Current Outpatient Medications  Medication Sig Dispense Refill  . aspirin 81 MG chewable tablet Chew 1 tablet (81 mg total) by mouth daily. 90 tablet 3  . atorvastatin (LIPITOR) 80 MG tablet Take 1 tablet (80 mg total) by mouth daily at 6 PM. 90 tablet 3  . Cholecalciferol (VITAMIN D PO) Take 5,000 Units by mouth daily.    . Cranberry 1000 MG CAPS Take 1 capsule by mouth daily as needed (yeast infection).    . nitroGLYCERIN (NITROSTAT) 0.4 MG SL tablet Place 1 tablet (0.4 mg total) under the tongue every 5 (five) minutes x 3 doses as needed for chest pain. 25 tablet 3  . OVER THE COUNTER MEDICATION Take 500 mg by mouth daily. D-Mannose    . Probiotic Product (PROBIOTIC ADVANCED PO) Take by mouth.    . sodium chloride (MURO 128) 5 % ophthalmic solution Place 1 drop into both eyes 3 (three) times daily.     . ticagrelor (BRILINTA) 90 MG TABS tablet Take 1 tablet (90 mg total) by mouth 2 (two) times daily. 180 tablet 3   Objective: BP 110/76 (BP Location: Left Arm,  Patient Position: Sitting, Cuff Size: Normal)   Pulse 78   Temp 98.6 F (37 C) (Oral)   Ht 5\' 2"  (1.575 m)   Wt 162 lb (73.5 kg)   LMP 03/18/2011   SpO2 96%   BMI 29.63 kg/m  Gen: NAD, resting comfortably CV: RRR no murmurs rubs or gallops Lungs: CTAB no crackles, wheeze, rhonchi Ext: no edema Skin: warm, dry  Assessment/Plan:  CAD S/P percutaneous coronary angioplasty S: Getting further out from her stent-her shortness of breath and fatigue issues have much improved.  She is able to take a deep breath and feel like she can catch her breath as well.  She remains on aspirin, statin, Brilinta. A/P: Continue current medications.  Patient plans to proceed with cardiac rehab which I think is a great idea.  He is eager to increase her exercise and I think this will guide her how to safely do that.  Hot flashes due to menopause S: since stopping HRT- has had hot flashes that can wake her from sleep A/P: We discussed herbal options and lack of good evidence as well as some of her contradictions due to her medical history.  She does not really want to take an additional medication.  She does reflect back when I asked about acupuncture and realizes that was helpful in the past-she is  considering restarting this  Overweight (BMI 25.0-29.9) S:  patient was to bring in a chart of calorie intake daily. Calorie goal of 1500 calories. She was exercising well previously and has continued that ranging from 20-45 minutes if not more per day.   Her calorie chart easily averages under 1500 calories- she states the issue is continuing that when her travel increases with work  [facility] Readings from Last 3 Encounters:  12/08/17 162 lb (73.5 kg)  11/10/17 164 lb 6.4 oz (74.6 kg)  11/03/17 166 lb 3.2 oz (75.4 kg)  A/P: I congratulated patient on her continued progress-She is down 4 pounds since last visit.  I encouraged her to continue her regular exercise in the 1500-calorie or less diet.  She wishes she had  made more progress but I told her I pound a week is very reasonable.  I think if she could get to 150 over the next year that would be excellent progress.  Goal for next visit is 2 to 5  pounds  Dyslipidemia, goal LDL below 70 LDL above 91 on max dose statin. Appears she has been referred to lipid clinic under Dr. Lysbeth Penner direction but I do not see that follow up visit charted yet.    August or September follow up. She wants to have some time with cardiac rehab under her belt first.   Return precautions advised.  Garret Reddish, MD

## 2017-12-15 ENCOUNTER — Encounter: Payer: Self-pay | Admitting: Family Medicine

## 2018-01-06 ENCOUNTER — Telehealth (HOSPITAL_COMMUNITY): Payer: Self-pay | Admitting: Pharmacy Technician

## 2018-01-08 NOTE — Telephone Encounter (Signed)
Cardiac Rehab - Pharmacy Resident Documentation   Patient unable to be reached after three call attempts. Please complete allergy verification and medication review during patient's cardiac rehab appointment.    Bertis Ruddy, PharmD Pharmacy Resident [phone] 01/08/2018 4:03 PM

## 2018-01-11 ENCOUNTER — Encounter (HOSPITAL_COMMUNITY): Payer: Self-pay

## 2018-01-11 NOTE — Progress Notes (Signed)
Cardiac Rehab Medication Review by RN  Does the patient  feel that his/her medications are working for him/her?  yes  Has the patient been experiencing any side effects to the medications prescribed?  no  Does the patient measure his/her own blood pressure or blood glucose at home?  yes   Does the patient have any problems obtaining medications due to transportation or finances?   no  Understanding of regimen: good Understanding of indications: good Potential of compliance: good    Pharmacist comments:     Nancie Neas Idara [place]side 01/11/2018 12:04 PM

## 2018-01-12 ENCOUNTER — Encounter (HOSPITAL_COMMUNITY)
Admission: RE | Admit: 2018-01-12 | Discharge: 2018-01-12 | Disposition: A | Discharge status: Home / Self Care | Payer: BLUE CROSS/BLUE SHIELD | Source: Ambulatory Visit | Attending: Cardiovascular Disease | Admitting: Cardiovascular Disease

## 2018-01-12 ENCOUNTER — Encounter (HOSPITAL_COMMUNITY): Payer: Self-pay

## 2018-01-12 VITALS — BP 96/64 | HR 84 | Ht 64.0 in | Wt 158.7 lb

## 2018-01-12 DIAGNOSIS — I213 ST elevation (STEMI) myocardial infarction of unspecified site: Secondary | ICD-10-CM

## 2018-01-12 DIAGNOSIS — Z955 Presence of coronary angioplasty implant and graft: Secondary | ICD-10-CM | POA: Insufficient documentation

## 2018-01-12 HISTORY — DX: Atherosclerotic heart disease of native coronary artery without angina pectoris: I25.10

## 2018-01-12 NOTE — Progress Notes (Signed)
Cardiac Individual Treatment Plan  Patient Details  Name: SAANYA ZIESKE MRN: [id number] Date of Birth: 10-06-1960 Referring Provider:     CARDIAC REHAB PHASE II ORIENTATION from 01/12/2018 in [facility]  Referring Provider  Quay Burow MD      Initial Encounter Date:    CARDIAC REHAB PHASE II ORIENTATION from 01/12/2018 in [facility]  Date  01/12/18      Visit Diagnosis: ST elevation myocardial infarction (STEMI), unspecified artery ([place]) 09/26/17  Stented coronary artery s/p DES RCA 09/26/17  Patient's Home Medications on Admission:  Current Outpatient Medications:  .  aspirin 81 MG chewable tablet, Chew 1 tablet (81 mg total) by mouth daily., Disp: 90 tablet, Rfl: 3 .  atorvastatin (LIPITOR) 80 MG tablet, Take 1 tablet (80 mg total) by mouth daily at 6 PM., Disp: 90 tablet, Rfl: 3 .  Cholecalciferol (VITAMIN D PO), Take 5,000 Units by mouth daily., Disp: , Rfl:  .  Cranberry 1000 MG CAPS, Take 1 capsule by mouth daily as needed (yeast infection)., Disp: , Rfl:  .  nitroGLYCERIN (NITROSTAT) 0.4 MG SL tablet, Place 1 tablet (0.4 mg total) under the tongue every 5 (five) minutes x 3 doses as needed for chest pain., Disp: 25 tablet, Rfl: 3 .  OVER THE COUNTER MEDICATION, Take 500 mg by mouth daily. D-Mannose, Disp: , Rfl:  .  Probiotic Product (PROBIOTIC ADVANCED PO), Take by mouth., Disp: , Rfl:  .  sodium chloride (MURO 128) 5 % ophthalmic solution, Place 1 drop into both eyes 3 (three) times daily. , Disp: , Rfl:  .  ticagrelor (BRILINTA) 90 MG TABS tablet, Take 1 tablet (90 mg total) by mouth 2 (two) times daily., Disp: 180 tablet, Rfl: 3  Past Medical History: Past Medical History:  Diagnosis Date  . Acute ST elevation myocardial infarction (STEMI) of inferior wall ([place]) 09/26/2017   Pt presented 09/26/17 with an acute inferior STEMI. Door to balloon time 30 minutes.   . Coronary artery disease   . Heart disease    . Hyperlipidemia   . Sleep apnea 2007   uses c pap on occasion    Tobacco Use: Social History   Tobacco Use  Smoking Status Never Smoker  Smokeless Tobacco Never Used    Labs: Recent Review Flowsheet Data    Labs for ITP Cardiac and Pulmonary Rehab Latest Ref Rng & Units 09/26/2017 09/26/2017 09/26/2017 09/26/2017 11/03/2017   Cholestrol 0 - 200 mg/dL - - - 240(H) -   LDLCALC 0 - 99 mg/dL - - - 176(H) -   LDLDIRECT mg/dL - - - - 91.0   HDL >40 mg/dL - - - 52 -   Trlycerides <150 mg/dL - - - 61 -   Hemoglobin A1c 4.8 - 5.6 % - - 5.6 - -   TCO2 22 - 32 mmol/L 21(L) 20(L) - - -      Capillary Blood Glucose: No results found for: GLUCAP   Exercise Target Goals: Date: 01/12/18  Exercise Program Goal: Individual exercise prescription set using results from initial 6 min walk test and THRR while considering  patient's activity barriers and safety.   Exercise Prescription Goal: Initial exercise prescription builds to 30-45 minutes a day of aerobic activity, 2-3 days per week.  Home exercise guidelines will be given to patient during program as part of exercise prescription that the participant will acknowledge.  Activity Barriers & Risk Stratification: Activity Barriers & Cardiac Risk Stratification -  01/12/18 1109      Activity Barriers & Cardiac Risk Stratification   Activity Barriers  Deconditioning;Muscular Weakness;Other (comment)    Comments  L sided hip and knee discomfort, B ITB tightness, R jumper's knee    Cardiac Risk Stratification  High       6 Minute Walk: 6 Minute Walk    Row Name 01/12/18 0854         6 Minute Walk   Phase  Initial     Distance  1613 feet     Walk Time  6 minutes     # of Rest Breaks  0     MPH  3.05     METS  3.93     RPE  12     VO2 Peak  13.74     Symptoms  No     Resting HR  84 bpm     Resting BP  96/64     Resting Oxygen Saturation   98 %     Exercise Oxygen Saturation  during 6 min walk  99 %     Max Ex. HR  105 bpm      Max Ex. BP  108/60     2 Minute Post BP  100/68        Oxygen Initial Assessment:   Oxygen Re-Evaluation:   Oxygen Discharge (Final Oxygen Re-Evaluation):   Initial Exercise Prescription: Initial Exercise Prescription - 01/12/18 1000      Date of Initial Exercise RX and Referring Provider   Date  01/12/18    Referring Provider  Quay Burow MD    Expected Discharge Date  04/14/18      Treadmill   MPH  3.2    Grade  1    Minutes  10    METs  3.89      Bike   Level  1    Minutes  19    METs  3.6      NuStep   Level  4    SPM  89    Minutes  19    METs  3      Prescription Details   Frequency (times per week)  3    Duration  Progress to 30 minutes of continuous aerobic without signs/symptoms of physical distress      Intensity   THRR 40-80% of Max Heartrate  65-130    Ratings of Perceived Exertion  11-13    Perceived Dyspnea  0-4      Progression   Progression  Continue to progress workloads to maintain intensity without signs/symptoms of physical distress.      Resistance Training   Training Prescription  Yes    Weight  4lbs    Reps  10-15       Perform Capillary Blood Glucose checks as needed.  Exercise Prescription Changes:   Exercise Comments:   Exercise Goals and Review: Exercise Goals    Row Name 01/12/18 0916 01/12/18 1110           Exercise Goals   Increase Physical Activity  Yes  -      Intervention  Provide advice, education, support and counseling about physical activity/exercise needs.;Develop an individualized exercise prescription for aerobic and resistive training based on initial evaluation findings, risk stratification, comorbidities and participant's personal goals.  -      Expected Outcomes  Short Term: Attend rehab on a regular basis to increase amount of physical activity.;Long Term: Add in home  exercise to make exercise part of routine and to increase amount of physical activity.;Long Term: Exercising regularly at  least 3-5 days a week.  -      Increase Strength and Stamina  Yes Get back to aerobic/weights training. Be able to do inclines without difficulty.. Receive guidance for food and exercise while on travel.  Yes Return to weight training and aerobics at the gym. Be able to climb uphill, inclines and stairs without difficulty.  Receive guidance on food and exercise program while on travel and away from home.      Intervention  Provide advice, education, support and counseling about physical activity/exercise needs.;Develop an individualized exercise prescription for aerobic and resistive training based on initial evaluation findings, risk stratification, comorbidities and participant's personal goals.  -      Expected Outcomes  Short Term: Increase workloads from initial exercise prescription for resistance, speed, and METs.;Short Term: Perform resistance training exercises routinely during rehab and add in resistance training at home;Long Term: Improve cardiorespiratory fitness, muscular endurance and strength as measured by increased METs and functional capacity (6MWT)  -      Able to understand and use rate of perceived exertion (RPE) scale  Yes  -      Intervention  Provide education and explanation on how to use RPE scale  -      Expected Outcomes  Short Term: Able to use RPE daily in rehab to express subjective intensity level;Long Term:  Able to use RPE to guide intensity level when exercising independently  -      Knowledge and understanding of Target Heart Rate Range (THRR)  Yes  -      Intervention  Provide education and explanation of THRR including how the numbers were predicted and where they are located for reference  -      Expected Outcomes  Short Term: Able to state/look up THRR;Long Term: Able to use THRR to govern intensity when exercising independently;Short Term: Able to use daily as guideline for intensity in rehab  -      Able to check pulse independently  Yes  -      Intervention   Review the importance of being able to check your own pulse for safety during independent exercise;Provide education and demonstration on how to check pulse in carotid and radial arteries.  -      Expected Outcomes  Short Term: Able to explain why pulse checking is important during independent exercise;Long Term: Able to check pulse independently and accurately  -      Understanding of Exercise Prescription  Yes  -      Intervention  Provide education, explanation, and written materials on patient's individual exercise prescription  -      Expected Outcomes  Short Term: Able to explain program exercise prescription;Long Term: Able to explain home exercise prescription to exercise independently  -         Exercise Goals Re-Evaluation :    Discharge Exercise Prescription (Final Exercise Prescription Changes):   Nutrition:  Target Goals: Understanding of nutrition guidelines, daily intake of sodium 1500mg , cholesterol 200mg , calories 30% from fat and 7% or less from saturated fats, daily to have 5 or more servings of fruits and vegetables.  Biometrics: Pre Biometrics - 01/12/18 0843      Pre Biometrics   Height  5\' 4"  (1.626 m)    Weight  158 lb 11.7 oz (72 kg)    Waist Circumference  34 inches  Hip Circumference  40.5 inches    Waist to Hip Ratio  0.84 %    BMI (Calculated)  27.23    Triceps Skinfold  34 mm    % Body Fat  38.7 %    Grip Strength  31 kg    Flexibility  16 in    Single Leg Stand  30 seconds        Nutrition Therapy Plan and Nutrition Goals:   Nutrition Assessments:   Nutrition Goals Re-Evaluation:   Nutrition Goals Re-Evaluation:   Nutrition Goals Discharge (Final Nutrition Goals Re-Evaluation):   Psychosocial: Target Goals: Acknowledge presence or absence of significant depression and/or stress, maximize coping skills, provide positive support system. Participant is able to verbalize types and ability to use techniques and skills needed for  reducing stress and depression.  Initial Review & Psychosocial Screening: Initial Psych Review & Screening - 01/12/18 1003      Initial Review   Current issues with  None Identified      Family Dynamics   Good Support System?  Yes friends, family       Barriers   Psychosocial barriers to participate in program  There are no identifiable barriers or psychosocial needs.      Screening Interventions   Interventions  Encouraged to exercise       Quality of Life Scores: Quality of Life - 01/12/18 1004      Quality of Life   Select  Quality of Life      Quality of Life Scores   Health/Function Pre  25.3 %    Socioeconomic Pre  27.2 %    Psych/Spiritual Pre  25.8 %    Family Pre  30 %    GLOBAL Pre  26.51 %      Scores of 19 and below usually indicate a poorer quality of life in these areas.  A difference of  2-3 points is a clinically meaningful difference.  A difference of 2-3 points in the total score of the Quality of Life Index has been associated with significant improvement in overall quality of life, self-image, physical symptoms, and general health in studies assessing change in quality of life.  PHQ-9: Recent Review Flowsheet Data    Depression screen [facility] 2/9 11/03/2017   Decreased Interest 0   Down, Depressed, Hopeless 0   PHQ - 2 Score 0     Interpretation of Total Score  Total Score Depression Severity:  1-4 = Minimal depression, 5-9 = Mild depression, 10-14 = Moderate depression, 15-19 = Moderately severe depression, 20-27 = Severe depression   Psychosocial Evaluation and Intervention:   Psychosocial Re-Evaluation:   Psychosocial Discharge (Final Psychosocial Re-Evaluation):   Vocational Rehabilitation: Provide vocational rehab assistance to qualifying candidates.   Vocational Rehab Evaluation & Intervention: Vocational Rehab - 01/12/18 1002      Initial Vocational Rehab Evaluation & Intervention   Assessment shows need for Vocational  Rehabilitation  No Independent Consultant        Education: Education Goals: Education classes will be provided on a weekly basis, covering required topics. Participant will state understanding/return demonstration of topics presented.  Learning Barriers/Preferences: Learning Barriers/Preferences - 01/12/18 0915      Learning Barriers/Preferences   Learning Barriers  Sight    Learning Preferences  Verbal Instruction;Written Material       Education Topics: Count Your Pulse:  -Group instruction provided by verbal instruction, demonstration, patient participation and written materials to support subject.  Instructors address importance of being able  to find your pulse and how to count your pulse when at home without a heart monitor.  Patients get hands on experience counting their pulse with staff help and individually.   Heart Attack, Angina, and Risk Factor Modification:  -Group instruction provided by verbal instruction, video, and written materials to support subject.  Instructors address signs and symptoms of angina and heart attacks.    Also discuss risk factors for heart disease and how to make changes to improve heart health risk factors.   Functional Fitness:  -Group instruction provided by verbal instruction, demonstration, patient participation, and written materials to support subject.  Instructors address safety measures for doing things around the house.  Discuss how to get up and down off the floor, how to pick things up properly, how to safely get out of a chair without assistance, and balance training.   Meditation and Mindfulness:  -Group instruction provided by verbal instruction, patient participation, and written materials to support subject.  Instructor addresses importance of mindfulness and meditation practice to help reduce stress and improve awareness.  Instructor also leads participants through a meditation exercise.    Stretching for Flexibility and  Mobility:  -Group instruction provided by verbal instruction, patient participation, and written materials to support subject.  Instructors lead participants through series of stretches that are designed to increase flexibility thus improving mobility.  These stretches are additional exercise for major muscle groups that are typically performed during regular warm up and cool down.   Hands Only CPR:  -Group verbal, video, and participation provides a basic overview of AHA guidelines for community CPR. Role-play of emergencies allow participants the opportunity to practice calling for help and chest compression technique with discussion of AED use.   Hypertension: -Group verbal and written instruction that provides a basic overview of hypertension including the most recent diagnostic guidelines, risk factor reduction with self-care instructions and medication management.    Nutrition I class: Heart Healthy Eating:  -Group instruction provided by PowerPoint slides, verbal discussion, and written materials to support subject matter. The instructor gives an explanation and review of the Therapeutic Lifestyle Changes diet recommendations, which includes a discussion on lipid goals, dietary fat, sodium, fiber, plant stanol/sterol esters, sugar, and the components of a well-balanced, healthy diet.   Nutrition II class: Lifestyle Skills:  -Group instruction provided by PowerPoint slides, verbal discussion, and written materials to support subject matter. The instructor gives an explanation and review of label reading, grocery shopping for heart health, heart healthy recipe modifications, and ways to make healthier choices when eating out.   Diabetes Question & Answer:  -Group instruction provided by PowerPoint slides, verbal discussion, and written materials to support subject matter. The instructor gives an explanation and review of diabetes co-morbidities, pre- and post-prandial blood glucose goals,  pre-exercise blood glucose goals, signs, symptoms, and treatment of hypoglycemia and hyperglycemia, and foot care basics.   Diabetes Blitz:  -Group instruction provided by PowerPoint slides, verbal discussion, and written materials to support subject matter. The instructor gives an explanation and review of the physiology behind type 1 and type 2 diabetes, diabetes medications and rational behind using different medications, pre- and post-prandial blood glucose recommendations and Hemoglobin A1c goals, diabetes diet, and exercise including blood glucose guidelines for exercising safely.    Portion Distortion:  -Group instruction provided by PowerPoint slides, verbal discussion, written materials, and food models to support subject matter. The instructor gives an explanation of serving size versus portion size, changes in portions sizes over the  last 20 years, and what consists of a serving from each food group.   Stress Management:  -Group instruction provided by verbal instruction, video, and written materials to support subject matter.  Instructors review role of stress in heart disease and how to cope with stress positively.     Exercising on Your Own:  -Group instruction provided by verbal instruction, power point, and written materials to support subject.  Instructors discuss benefits of exercise, components of exercise, frequency and intensity of exercise, and end points for exercise.  Also discuss use of nitroglycerin and activating EMS.  Review options of places to exercise outside of rehab.  Review guidelines for sex with heart disease.   Cardiac Drugs I:  -Group instruction provided by verbal instruction and written materials to support subject.  Instructor reviews cardiac drug classes: antiplatelets, anticoagulants, beta blockers, and statins.  Instructor discusses reasons, side effects, and lifestyle considerations for each drug class.   Cardiac Drugs II:  -Group instruction  provided by verbal instruction and written materials to support subject.  Instructor reviews cardiac drug classes: angiotensin converting enzyme inhibitors (ACE-I), angiotensin II receptor blockers (ARBs), nitrates, and calcium channel blockers.  Instructor discusses reasons, side effects, and lifestyle considerations for each drug class.   Anatomy and Physiology of the Circulatory System:  Group verbal and written instruction and models provide basic cardiac anatomy and physiology, with the coronary electrical and arterial systems. Review of: AMI, Angina, Valve disease, Heart Failure, Peripheral Artery Disease, Cardiac Arrhythmia, Pacemakers, and the ICD.   Other Education:  -Group or individual verbal, written, or video instructions that support the educational goals of the cardiac rehab program.   Holiday Eating Survival Tips:  -Group instruction provided by PowerPoint slides, verbal discussion, and written materials to support subject matter. The instructor gives patients tips, tricks, and techniques to help them not only survive but enjoy the holidays despite the onslaught of food that accompanies the holidays.   Knowledge Questionnaire Score: Knowledge Questionnaire Score - 01/12/18 1002      Knowledge Questionnaire Score   Pre Score  20/24       Core Components/Risk Factors/Patient Goals at Admission: Personal Goals and Risk Factors at Admission - 01/12/18 1112      Core Components/Risk Factors/Patient Goals on Admission    Weight Management  Yes;Weight Maintenance;Weight Loss    Intervention  Weight Management: Develop a combined nutrition and exercise program designed to reach desired caloric intake, while maintaining appropriate intake of nutrient and fiber, sodium and fats, and appropriate energy expenditure required for the weight goal.;Weight Management: Provide education and appropriate resources to help participant work on and attain dietary goals.    Admit Weight  158 lb  11.7 oz (72 kg)    Goal Weight: Short Term  155 lb (70.3 kg)    Goal Weight: Long Term  150 lb (68 kg)    Expected Outcomes  Short Term: Continue to assess and modify interventions until short term weight is achieved;Long Term: Adherence to nutrition and physical activity/exercise program aimed toward attainment of established weight goal;Weight Maintenance: Understanding of the daily nutrition guidelines, which includes 25-35% calories from fat, 7% or less cal from saturated fats, less than 200mg  cholesterol, less than 1.5gm of sodium, & 5 or more servings of fruits and vegetables daily;Understanding recommendations for meals to include 15-35% energy as protein, 25-35% energy from fat, 35-60% energy from carbohydrates, less than 200mg  of dietary cholesterol, 20-35 gm of total fiber daily;Weight Loss: Understanding of general recommendations for  a balanced deficit meal plan, which promotes 1-2 lb weight loss per week and includes a negative energy balance of [phone] kcal/d;Understanding of distribution of calorie intake throughout the day with the consumption of 4-5 meals/snacks       Core Components/Risk Factors/Patient Goals Review:    Core Components/Risk Factors/Patient Goals at Discharge (Final Review):    ITP Comments: ITP Comments    Row Name 01/11/18 1152           ITP Comments  Dr. Fransico Him, Medical Director           Comments: [place]  attended orientation from [phone] to 1020 to review rules and guidelines for program. Completed 6 minute walk test, Intitial ITP, and exercise prescription.  VSS. Telemetry-Sinus Rhythm negative QRS this has been previously documeted.  Asymptomatic.Barnet Pall, RN,BSN 01/12/2018 11:39 AM

## 2018-01-12 NOTE — Progress Notes (Signed)
Cathy Bell 57 y.o. female DOB: 03-01-1961 MRN: [id number]      Nutrition Note  1. ST elevation myocardial infarction (STEMI), unspecified artery ([place]) 09/26/17   2. Stented coronary artery s/p DES RCA 09/26/17    Past Medical History:  Diagnosis Date  . Acute ST elevation myocardial infarction (STEMI) of inferior wall ([place]) 09/26/2017   Pt presented 09/26/17 with an acute inferior STEMI. Door to balloon time 30 minutes.   . Coronary artery disease   . Heart disease   . Hyperlipidemia   . Sleep apnea 2007   uses c pap on occasion   Meds reviewed. Lipitor, vitamin D noted  HT: Ht Readings from Last 1 Encounters:  01/12/18 5\' 4"  (1.626 m)    WT: Wt Readings from Last 5 Encounters:  01/12/18 158 lb 11.7 oz (72 kg)  12/08/17 162 lb (73.5 kg)  11/10/17 164 lb 6.4 oz (74.6 kg)  11/03/17 166 lb 3.2 oz (75.4 kg)  10/27/17 164 lb (74.4 kg)     Body mass index is 27.25 kg/m.   Current tobacco use? no  Labs:  Lipid Panel     Component Value Date/Time   CHOL 240 (H) 09/26/2017 2334   TRIG 61 09/26/2017 2334   HDL 52 09/26/2017 2334   CHOLHDL 4.6 09/26/2017 2334   VLDL 12 09/26/2017 2334   LDLCALC 176 (H) 09/26/2017 2334   LDLDIRECT 91.0 11/03/2017 1451    Lab Results  Component Value Date   HGBA1C 5.6 09/26/2017   CBG (last 3)  No results for input(s): GLUCAP in the last 72 hours.  Nutrition Note Spoke with pt. Nutrition plan and goals reviewed with pt. Pt is following Step 2 of the Therapeutic Lifestyle Changes diet. Pt shared that she most needs help with eating while traveling for work (1 week out of the month). Discussed with patient that this will likely entail a combination of bringing foods with her, and buying/keeping foods in the hotel. Pt verbalized understanding. Pt shared that she has specific dietary restrictions: no dairy, nuts, salmon, grapes, alcohol, artificial sweeteners, and peanuts. Pt eats regularly across the day,generally 3 meals and two snacks.  Per discussion, pt does not use canned/convenience foods often. Pt rarely adds salt to food. Pt eats out infrequently. Pt expressed understanding of the information reviewed. Pt aware of nutrition education classes offered. Pt last HbA1C 5.6 on the cusp of prediabetes, will discuss consistent carbohydrate heart healthy meal plan with patient.  Nutrition Diagnosis ? Food-and nutrition-related knowledge deficit related to lack of exposure to information as related to diagnosis of: ? CVD  ? Overweight related to excessive energy intake as evidenced by a Body mass index is 27.25 kg/m.  Nutrition Intervention ? Pt's individual nutrition plan and goals reviewed with pt. ? Pt given handouts for: ? Nutrition I class ? Nutrition II class  ? Consistent vit K diet ? low sodium ? DM ? pre-diabetes  Nutrition Goal(s):   ? Pt to identify and limit food sources of saturated fat, trans fat, and sodium ? Pt to make good choices when eating out at restaurants.   Plan:  Pt to attend nutrition classes ? Nutrition I ? Nutrition II ? Portion Distortion  Will provide client-centered nutrition education as part of interdisciplinary care.   Monitor and evaluate progress toward nutrition goal with team.  Laurina Bustle, MS, RD, LDN 01/12/2018 1:33 PM

## 2018-01-13 ENCOUNTER — Ambulatory Visit (INDEPENDENT_AMBULATORY_CARE_PROVIDER_SITE_OTHER): Payer: BLUE CROSS/BLUE SHIELD | Admitting: Internal Medicine

## 2018-01-13 ENCOUNTER — Encounter: Payer: Self-pay | Admitting: Internal Medicine

## 2018-01-13 VITALS — BP 94/60 | HR 72 | Ht 63.0 in | Wt 156.0 lb

## 2018-01-13 DIAGNOSIS — E7849 Other hyperlipidemia: Secondary | ICD-10-CM

## 2018-01-13 DIAGNOSIS — I2510 Atherosclerotic heart disease of native coronary artery without angina pectoris: Secondary | ICD-10-CM

## 2018-01-13 DIAGNOSIS — I2119 ST elevation (STEMI) myocardial infarction involving other coronary artery of inferior wall: Secondary | ICD-10-CM | POA: Diagnosis not present

## 2018-01-13 DIAGNOSIS — Z9861 Coronary angioplasty status: Secondary | ICD-10-CM

## 2018-01-13 DIAGNOSIS — E785 Hyperlipidemia, unspecified: Secondary | ICD-10-CM | POA: Diagnosis not present

## 2018-01-13 MED ORDER — EZETIMIBE 10 MG PO TABS: 10.0000 mg | ORAL_TABLET | Freq: Every day | ORAL | 3 refills | Status: DC

## 2018-01-13 NOTE — Patient Instructions (Addendum)
Medication Instructions:   START zetia 10mg  daily  Labwork:  Your physician recommends that you return for lab work in [place] - fasting   Testing/Procedures:  NONE  Follow-Up:  Your physician recommends that you schedule a follow-up appointment in: [place] with Dr. Debara Pickett in lipid clinic  You have been referred to Dr. [name] (geneticist)     If you need a refill on your cardiac medications before your next appointment, please call your pharmacy.  Any Other Special Instructions Will Be Listed Below (If Applicable).

## 2018-01-13 NOTE — Progress Notes (Signed)
OFFICE NOTE  Chief Complaint:  Lipid management  Primary Care Physician: Marin Olp, MD  HPI:  Cathy Bell is a 57 y.o. female with a past medial history significant for coronary artery disease status post acute ST elevation MI of the inferior wall in March 2019.  Interestingly, she was not complaining of chest pain rather had a feeling of "dehydration in her throat" and abdominal upset and the feeling of impending diarrhea which is actually what led her to the hospital.  She had been having worsening symptoms for several months prior to that, after having eye surgery for rare eye condition.  She had attributed to that.  She reports she has a lot of GI symptoms and tends to run low blood pressure with frequent presyncopal episodes suggesting high vagal tone.  She is referred to me today for lipid management.  She is the first known member of her family to have early onset coronary artery disease however both of her parents have pacemakers and advanced age.  Her father had abdominal aortic aneurysm stent in his 22s.  She does have 4 sisters, however one is adopted.  She knows that 2 sisters have had cholesterol testing indicating both of their cholesterols were 270 and 290 respectively.  Her cholesterol total was also 290 initially with LDL of 187 at the time of her STEMI.  She was appropriately placed on high intensity statin therapy with atorvastatin 80 mg daily which she is tolerating.  This is because marked reduction in LDL is expected with a good response to 91 based on recent direct LDL, however goal LDL based on current guidelines is less than 70.  Currently she is asymptomatic, denying any chest pain or worsening shortness of breath.  She reports a fairly healthy diet in fact is been on a low carbohydrate diet due to a history of gestational diabetes with her children.  She does have 2 sons in their 67s.  1 of the 2 has had a stroke in his early 47s and was found to have a PFO.   The other son also has a PFO.  She knows that one son has normal cholesterol but the other has not had testing.  PMHx:  Past Medical History:  Diagnosis Date  . Acute ST elevation myocardial infarction (STEMI) of inferior wall ([place]) 09/26/2017   Pt presented 09/26/17 with an acute inferior STEMI. Door to balloon time 30 minutes.   . Coronary artery disease   . Heart disease   . Hyperlipidemia   . Sleep apnea 2007   uses c pap on occasion    Past Surgical History:  Procedure Laterality Date  . CARDIAC CATHETERIZATION    . CORONARY/GRAFT ACUTE MI REVASCULARIZATION N/A 09/26/2017   Procedure: Coronary/Graft Acute MI Revascularization;  Surgeon: Lorretta Harp, MD;  Location: [facility];  Service: Cardiovascular;  Laterality: N/A;  . LEFT HEART CATH AND CORONARY ANGIOGRAPHY N/A 09/26/2017   Procedure: LEFT HEART CATH AND CORONARY ANGIOGRAPHY;  Surgeon: Lorretta Harp, MD;  Location: [facility];  Service: Cardiovascular;  Laterality: N/A;  . nodulectomy     Salzmann Nodular Degeneration  . STENT PLACEMENT VASCULAR ([place] HX)  2019  . TONSILLECTOMY AND ADENOIDECTOMY  1967  . WISDOM TOOTH EXTRACTION  1981    FAMHx:  Family History  Problem Relation Age of Onset  . Colon cancer Father        healthy until 40s then developed  . Atrial fibrillation Father  pacemaker  . Hyperlipidemia Mother   . Other Mother        pacemaker. she is very short stature. healthy until 73s  . Breast cancer Sister        stage 0   . Hashimoto's thyroiditis Sister   . Stroke Son        due to PFO  . Diabetes Maternal Grandfather     SOCHx:   reports that she has never smoked. She has never used smokeless tobacco. She reports that she does not drink alcohol or use drugs.  ALLERGIES:  Allergies  Allergen Reactions  . Bupropion Hives    Other reaction(s): Unknown  . Cefprozil Hives  . Cefprozil Hives and Other (See Comments)  . Cheese Hives  . Peanut-Containing Drug  Products Hives  . Prednisolone     Caused dangerously high eye pressure around time of eye surgery  . Salmon [Fish Allergy] Hives  . Wellbutrin [Bupropion Hcl] Hives    ROS: Pertinent items noted in HPI and remainder of comprehensive ROS otherwise negative.  HOME MEDS: Current Outpatient Medications on File Prior to Visit  Medication Sig Dispense Refill  . aspirin 81 MG chewable tablet Chew 1 tablet (81 mg total) by mouth daily. 90 tablet 3  . atorvastatin (LIPITOR) 80 MG tablet Take 1 tablet (80 mg total) by mouth daily at 6 PM. 90 tablet 3  . Cholecalciferol (VITAMIN D PO) Take 5,000 Units by mouth daily.    . Cranberry 1000 MG CAPS Take 1 capsule by mouth daily as needed (yeast infection).    . nitroGLYCERIN (NITROSTAT) 0.4 MG SL tablet Place 1 tablet (0.4 mg total) under the tongue every 5 (five) minutes x 3 doses as needed for chest pain. 25 tablet 3  . OVER THE COUNTER MEDICATION Take 500 mg by mouth daily. D-Mannose    . Probiotic Product (PROBIOTIC ADVANCED PO) Take by mouth.    . sodium chloride (MURO 128) 5 % ophthalmic solution Place 1 drop into both eyes 3 (three) times daily.     . ticagrelor (BRILINTA) 90 MG TABS tablet Take 1 tablet (90 mg total) by mouth 2 (two) times daily. 180 tablet 3   No current facility-administered medications on file prior to visit.     LABS/IMAGING: No results found for this or any previous visit (from the past 48 hour(s)). No results found.  LIPID PANEL:    Component Value Date/Time   CHOL 240 (H) 09/26/2017 2334   TRIG 61 09/26/2017 2334   HDL 52 09/26/2017 2334   CHOLHDL 4.6 09/26/2017 2334   VLDL 12 09/26/2017 2334   LDLCALC 176 (H) 09/26/2017 2334   LDLDIRECT 91.0 11/03/2017 1451     WEIGHTS: Wt Readings from Last 3 Encounters:  01/13/18 156 lb (70.8 kg)  01/12/18 158 lb 11.7 oz (72 kg)  12/08/17 162 lb (73.5 kg)    VITALS: BP 94/60 (BP Location: Left Arm, Patient Position: Sitting, Cuff Size: Normal)   Pulse 72   Ht  5\' 3"  (1.6 m)   Wt 156 lb (70.8 kg)   LMP 03/18/2011   BMI 27.63 kg/m   EXAM: General appearance: alert and no distress Neck: no carotid bruit, no JVD and thyroid not enlarged, symmetric, no tenderness/mass/nodules Lungs: clear to auscultation bilaterally Heart: regular rate and rhythm Abdomen: soft, non-tender; bowel sounds normal; no masses,  no organomegaly Extremities: extremities normal, atraumatic, no cyanosis or edema Pulses: 2+ and symmetric Skin: Skin color, texture, turgor normal. No rashes or  lesions or No tendinous xanthomas Neurologic: Grossly normal Psych: Pleasant  EKG: Deferred  ASSESSMENT: 1. Coronary artery disease with ST elevation MI status post PCI/DES to the mid RCA which was 100% occluded (09/2017) 2. Residual coronary artery disease with an 80% proximal LAD stenosis 3. Ischemic cardiomyopathy EF 45 to 50% (improved to 55 to 60% post PCI by echo-09/2017) 4. Low risk Myoview without significant reversible ischemia (10/2017) 5. 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   Mrs. Suchy has premature onset coronary artery disease with ST elevation MI.  She was found to have marked dyslipidemia with LDL close to 190 and there is a family history of marked dyslipidemia and 2 of her sisters.  There have not been any other premature coronary events in either of her parents or siblings.  Nonetheless, it is likely she has FH.  I discussed the possibility of genetic testing with her and she seems interested in that.  It could be helpful to screen both her siblings and her children.  From a management standpoint, she has had marked improvement in her dyslipidemia on statin therapy.  She seems to be tolerating high-dose atorvastatin.  I do feel that she will reach goal with the addition of ezetimibe.  Should give her an additional 20 to 25% reduction in cholesterol and hopefully meet her targets.  I also would expect additional risk reduction.  We will plan to start Zetia 10 mg  daily and repeat lipid profile in 3 months.  She will be referred to Dr. Broadus John for evaluation of possible genetic testing.  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  We will try to estimate her cost prior to proceeding.  Thanks for the kind referral.  Pixie Casino, MD, FACC, [place] Director of the [facility]  [facility] Diplomate of the American Board of Clinical Lipidology Attending Cardiologist  Direct Dial: [phone]  Fax: [phone]  Website:  [email or web address]   Nadean Corwin Imani Fiebelkorn 01/13/2018, 10:30 AM

## 2018-01-18 ENCOUNTER — Encounter (HOSPITAL_COMMUNITY)
Admission: RE | Admit: 2018-01-18 | Discharge: 2018-01-18 | Disposition: A | Discharge status: Home / Self Care | Payer: BLUE CROSS/BLUE SHIELD | Source: Ambulatory Visit | Attending: Cardiovascular Disease | Admitting: Cardiovascular Disease

## 2018-01-18 ENCOUNTER — Encounter (HOSPITAL_COMMUNITY): Payer: BLUE CROSS/BLUE SHIELD

## 2018-01-18 DIAGNOSIS — I213 ST elevation (STEMI) myocardial infarction of unspecified site: Secondary | ICD-10-CM | POA: Diagnosis not present

## 2018-01-18 DIAGNOSIS — Z955 Presence of coronary angioplasty implant and graft: Secondary | ICD-10-CM

## 2018-01-18 NOTE — Progress Notes (Signed)
Daily Session Note  Patient Details  Name: Cathy Bell MRN: [id number] Date of Birth: 1960-09-30 Referring Provider:     CARDIAC REHAB PHASE II ORIENTATION from 01/12/2018 in [place]  Referring Provider  Quay Burow MD      Encounter Date: 01/18/2018  Check In: Session Check In - 01/18/18 1207      Check-In   Location  [facility]    Staff Present  Jiles Garter, RN BSN;Tyara Carol Ada, MS,ACSM CEP, Exercise Physiologist;Amber Fair, MS, ACSM RCEP, Exercise Physiologist    Supervising physician immediately available to respond to emergencies  [facility] immediately available    Physician(s)  Dr. Cathlean Sauer     Medication changes reported      No    Fall or balance concerns reported     No    Tobacco Cessation  No Change    Warm-up and Cool-down  Performed as group-led instruction    Resistance Training Performed  Yes    VAD Patient?  No    PAD/SET Patient?  No      Pain Assessment   Currently in Pain?  No/denies    Multiple Pain Sites  No       Capillary Blood Glucose: No results found for this or any previous visit (from the past 24 hour(s)).  Exercise Prescription Changes - 01/18/18 1300      Response to Exercise   Blood Pressure (Admit)  114/74    Blood Pressure (Exercise)  126/60    Blood Pressure (Exit)  98/62    Heart Rate (Admit)  85 bpm    Heart Rate (Exercise)  133 bpm    Heart Rate (Exit)  74 bpm    Rating of Perceived Exertion (Exercise)  13    Perceived Dyspnea (Exercise)  0    Symptoms  Chest Pain on Treadmill     Comments  Workload decrease on TM due to CP     Duration  Progress to 30 minutes of  aerobic without signs/symptoms of physical distress    Intensity  THRR New      Progression   Progression  Continue to progress workloads to maintain intensity without signs/symptoms of physical distress.    Average METs  3.11      Resistance Training   Training Prescription  No      Treadmill   MPH  2.8    Grade  0    Minutes  0    METs  3.14      Bike   Level  1    Minutes  10    METs  3.6      NuStep   Level  4    SPM  85    Minutes  10    METs  2.6       Social History   Tobacco Use  Smoking Status Never Smoker  Smokeless Tobacco Never Used    Goals Met:  Exercise tolerated well  Goals Unmet:  Not Applicable  Comments: Pt started cardiac rehab today.  Pt tolerated light exercise without difficulty. VSS, telemetry-SR. Pt with some SOB. Oxygen saturation stable during workout 97%-100% RA. Pt reported mild chest pressure with incline on treadmill. Incline reduced to flat and chest pressure relieved.  VSS. Medication list reconciled. Pt denies barriers to medicaiton compliance.  PSYCHOSOCIAL ASSESSMENT:  PHQ-0. Pt exhibits positive coping skills, hopeful outlook with supportive family. No psychosocial needs identified at this time, no psychosocial  interventions necessary.   Pt oriented to exercise equipment and routine.    Understanding verbalized.    Dr. Fransico Him is Medical Director for Cardiac Rehab at [facility].

## 2018-01-20 ENCOUNTER — Telehealth (HOSPITAL_COMMUNITY): Payer: Self-pay | Admitting: *Deleted

## 2018-01-20 ENCOUNTER — Encounter: Payer: Self-pay | Admitting: Cardiovascular Disease

## 2018-01-20 ENCOUNTER — Encounter (HOSPITAL_COMMUNITY): Payer: BLUE CROSS/BLUE SHIELD

## 2018-01-20 ENCOUNTER — Telehealth: Payer: Self-pay | Admitting: Cardiovascular Disease

## 2018-01-20 NOTE — Telephone Encounter (Signed)
Message sent to scheduling to arrange f/u (see telephone note)

## 2018-01-20 NOTE — Telephone Encounter (Signed)
-----   Message from Erlene Quan, [place] sent at 01/19/2018  7:50 AM EDT ----- Regarding: RE: Cardiac Rehab I'll arrange for an office visit, for now I would not recommend exercise till she has chest pain.  Kerin Ransom PA-C 01/19/2018 7:52 AM ----- Message ----- From: Noel Christmas, RN Sent: 01/18/2018   3:53 PM To: Erlene Quan, PA-C Subject: Cardiac Rehab                                  Cathy Bell have had previous conversation with [name] on 11/18/17 about resuming exercise.  Cathy Bell reported moderate SOB on the machines today. O2 saturations were stable throughout ranging from 100% on the upright bike and 97% on the treadmill.  With incline of 1.0 on the treadmill, she reported vague symptoms of chest pressure.  She had relief of pressure with reducing speed and walking with no incline. BP stable on treadmill with this. 126/60. HR 112. No EKG changes.   Do you have any thoughts or recommendations?   Thank you, Noel Christmas

## 2018-01-20 NOTE — Telephone Encounter (Signed)
New Message:       Pt is calling and states she received a call stating that she needs to have a f/u appt with [facility] before she can come back to rehab.

## 2018-01-21 NOTE — Telephone Encounter (Signed)
Called [facility] to schedule appointment. Left call back number.

## 2018-01-21 NOTE — Telephone Encounter (Signed)
Patient returned call, scheduled to see Cathy Sims NP on 01/26/18 at 11:30. Patient verbalized understanding.

## 2018-01-21 NOTE — Progress Notes (Signed)
Cardiac Individual Treatment Plan  Patient Details  Name: Cathy Bell MRN: [id number] Date of Birth: [birth year] Referring Provider:     CARDIAC REHAB PHASE II ORIENTATION from 01/12/2018 in [place]  Referring Provider  Quay Burow MD      Initial Encounter Date:    CARDIAC REHAB PHASE II ORIENTATION from 01/12/2018 in [facility]  Date  01/12/18      Visit Diagnosis: ST elevation myocardial infarction (STEMI), unspecified artery ([place]) 09/26/17  Stented coronary artery s/p DES RCA 09/26/17  Patient's Home Medications on Admission:  Current Outpatient Medications:  .  aspirin 81 MG chewable tablet, Chew 1 tablet (81 mg total) by mouth daily., Disp: 90 tablet, Rfl: 3 .  atorvastatin (LIPITOR) 80 MG tablet, Take 1 tablet (80 mg total) by mouth daily at 6 PM., Disp: 90 tablet, Rfl: 3 .  Cholecalciferol (VITAMIN D PO), Take 5,000 Units by mouth daily., Disp: , Rfl:  .  Cranberry 1000 MG CAPS, Take 1 capsule by mouth daily as needed (yeast infection)., Disp: , Rfl:  .  ezetimibe (ZETIA) 10 MG tablet, Take 1 tablet (10 mg total) by mouth daily., Disp: 90 tablet, Rfl: 3 .  nitroGLYCERIN (NITROSTAT) 0.4 MG SL tablet, Place 1 tablet (0.4 mg total) under the tongue every 5 (five) minutes x 3 doses as needed for chest pain., Disp: 25 tablet, Rfl: 3 .  OVER THE COUNTER MEDICATION, Take 500 mg by mouth daily. D-Mannose, Disp: , Rfl:  .  Probiotic Product (PROBIOTIC ADVANCED PO), Take by mouth., Disp: , Rfl:  .  sodium chloride (MURO 128) 5 % ophthalmic solution, Place 1 drop into both eyes 3 (three) times daily. , Disp: , Rfl:  .  ticagrelor (BRILINTA) 90 MG TABS tablet, Take 1 tablet (90 mg total) by mouth 2 (two) times daily., Disp: 180 tablet, Rfl: 3  Past Medical History: Past Medical History:  Diagnosis Date  . Acute ST elevation myocardial infarction (STEMI) of inferior wall ([place]) 09/26/2017   Pt presented 09/26/17 with  an acute inferior STEMI. Door to balloon time 30 minutes.   . Coronary artery disease   . Heart disease   . Hyperlipidemia   . Sleep apnea 2007   uses c pap on occasion    Tobacco Use: Social History   Tobacco Use  Smoking Status Never Smoker  Smokeless Tobacco Never Used    Labs: Recent Review Flowsheet Data    Labs for ITP Cardiac and Pulmonary Rehab Latest Ref Rng & Units 09/26/2017 09/26/2017 09/26/2017 09/26/2017 11/03/2017   Cholestrol 0 - 200 mg/dL - - - 240(H) -   LDLCALC 0 - 99 mg/dL - - - 176(H) -   LDLDIRECT mg/dL - - - - 91.0   HDL >40 mg/dL - - - 52 -   Trlycerides <150 mg/dL - - - 61 -   Hemoglobin A1c 4.8 - 5.6 % - - 5.6 - -   TCO2 22 - 32 mmol/L 21(L) 20(L) - - -      Capillary Blood Glucose: No results found for: GLUCAP   Exercise Target Goals:    Exercise Program Goal: Individual exercise prescription set using results from initial 6 min walk test and THRR while considering  patient's activity barriers and safety.   Exercise Prescription Goal: Initial exercise prescription builds to 30-45 minutes a day of aerobic activity, 2-3 days per week.  Home exercise guidelines will be given to patient during program as  part of exercise prescription that the participant will acknowledge.  Activity Barriers & Risk Stratification: Activity Barriers & Cardiac Risk Stratification - 01/12/18 1109      Activity Barriers & Cardiac Risk Stratification   Activity Barriers  Deconditioning;Muscular Weakness;Other (comment)    Comments  L sided hip and knee discomfort, B ITB tightness, R jumper's knee    Cardiac Risk Stratification  High       6 Minute Walk: 6 Minute Walk    Row Name 01/12/18 0854         6 Minute Walk   Phase  Initial     Distance  1613 feet     Walk Time  6 minutes     # of Rest Breaks  0     MPH  3.05     METS  3.93     RPE  12     VO2 Peak  13.74     Symptoms  No     Resting HR  84 bpm     Resting BP  96/64     Resting Oxygen Saturation    98 %     Exercise Oxygen Saturation  during 6 min walk  99 %     Max Ex. HR  105 bpm     Max Ex. BP  108/60     2 Minute Post BP  100/68        Oxygen Initial Assessment:   Oxygen Re-Evaluation:   Oxygen Discharge (Final Oxygen Re-Evaluation):   Initial Exercise Prescription: Initial Exercise Prescription - 01/12/18 1000      Date of Initial Exercise RX and Referring Provider   Date  01/12/18    Referring Provider  Quay Burow MD    Expected Discharge Date  04/14/18      Treadmill   MPH  3.2    Grade  1    Minutes  10    METs  3.89      Bike   Level  1    Minutes  19    METs  3.6      NuStep   Level  4    SPM  89    Minutes  19    METs  3      Prescription Details   Frequency (times per week)  3    Duration  Progress to 30 minutes of continuous aerobic without signs/symptoms of physical distress      Intensity   THRR 40-80% of Max Heartrate  65-130    Ratings of Perceived Exertion  11-13    Perceived Dyspnea  0-4      Progression   Progression  Continue to progress workloads to maintain intensity without signs/symptoms of physical distress.      Resistance Training   Training Prescription  Yes    Weight  4lbs    Reps  10-15       Perform Capillary Blood Glucose checks as needed.  Exercise Prescription Changes: Exercise Prescription Changes    Row Name 01/18/18 1300             Response to Exercise   Blood Pressure (Admit)  114/74       Blood Pressure (Exercise)  126/60       Blood Pressure (Exit)  98/62       Heart Rate (Admit)  85 bpm       Heart Rate (Exercise)  133 bpm       Heart Rate (  Exit)  74 bpm       Rating of Perceived Exertion (Exercise)  13       Perceived Dyspnea (Exercise)  0       Symptoms  Chest Pain on Treadmill        Comments  Workload decrease on TM due to CP        Duration  Progress to 30 minutes of  aerobic without signs/symptoms of physical distress       Intensity  THRR New         Progression    Progression  Continue to progress workloads to maintain intensity without signs/symptoms of physical distress.       Average METs  3.11         Resistance Training   Training Prescription  No         Treadmill   MPH  2.8       Grade  0       Minutes  0       METs  3.14         Bike   Level  1       Minutes  10       METs  3.6         NuStep   Level  4       SPM  85       Minutes  10       METs  2.6          Exercise Comments: Exercise Comments    Row Name 01/18/18 1347           Exercise Comments  Pt's first day of exercise. Pt oriented to exericse equipment. Will continue to monitor and progress pt as tolerated.           Exercise Goals and Review: Exercise Goals    Row Name 01/12/18 0916 01/12/18 1110           Exercise Goals   Increase Physical Activity  Yes  -      Intervention  Provide advice, education, support and counseling about physical activity/exercise needs.;Develop an individualized exercise prescription for aerobic and resistive training based on initial evaluation findings, risk stratification, comorbidities and participant's personal goals.  -      Expected Outcomes  Short Term: Attend rehab on a regular basis to increase amount of physical activity.;Long Term: Add in home exercise to make exercise part of routine and to increase amount of physical activity.;Long Term: Exercising regularly at least 3-5 days a week.  -      Increase Strength and Stamina  Yes Get back to aerobic/weights training. Be able to do inclines without difficulty.. Receive guidance for food and exercise while on travel.  Yes Return to weight training and aerobics at the gym. Be able to climb uphill, inclines and stairs without difficulty.  Receive guidance on food and exercise program while on travel and away from home.      Intervention  Provide advice, education, support and counseling about physical activity/exercise needs.;Develop an individualized exercise prescription for  aerobic and resistive training based on initial evaluation findings, risk stratification, comorbidities and participant's personal goals.  -      Expected Outcomes  Short Term: Increase workloads from initial exercise prescription for resistance, speed, and METs.;Short Term: Perform resistance training exercises routinely during rehab and add in resistance training at home;Long Term: Improve cardiorespiratory fitness, muscular endurance and strength as measured by increased METs  and functional capacity (6MWT)  -      Able to understand and use rate of perceived exertion (RPE) scale  Yes  -      Intervention  Provide education and explanation on how to use RPE scale  -      Expected Outcomes  Short Term: Able to use RPE daily in rehab to express subjective intensity level;Long Term:  Able to use RPE to guide intensity level when exercising independently  -      Knowledge and understanding of Target Heart Rate Range (THRR)  Yes  -      Intervention  Provide education and explanation of THRR including how the numbers were predicted and where they are located for reference  -      Expected Outcomes  Short Term: Able to state/look up THRR;Long Term: Able to use THRR to govern intensity when exercising independently;Short Term: Able to use daily as guideline for intensity in rehab  -      Able to check pulse independently  Yes  -      Intervention  Review the importance of being able to check your own pulse for safety during independent exercise;Provide education and demonstration on how to check pulse in carotid and radial arteries.  -      Expected Outcomes  Short Term: Able to explain why pulse checking is important during independent exercise;Long Term: Able to check pulse independently and accurately  -      Understanding of Exercise Prescription  Yes  -      Intervention  Provide education, explanation, and written materials on patient's individual exercise prescription  -      Expected Outcomes  Short  Term: Able to explain program exercise prescription;Long Term: Able to explain home exercise prescription to exercise independently  -         Exercise Goals Re-Evaluation :    Discharge Exercise Prescription (Final Exercise Prescription Changes): Exercise Prescription Changes - 01/18/18 1300      Response to Exercise   Blood Pressure (Admit)  114/74    Blood Pressure (Exercise)  126/60    Blood Pressure (Exit)  98/62    Heart Rate (Admit)  85 bpm    Heart Rate (Exercise)  133 bpm    Heart Rate (Exit)  74 bpm    Rating of Perceived Exertion (Exercise)  13    Perceived Dyspnea (Exercise)  0    Symptoms  Chest Pain on Treadmill     Comments  Workload decrease on TM due to CP     Duration  Progress to 30 minutes of  aerobic without signs/symptoms of physical distress    Intensity  THRR New      Progression   Progression  Continue to progress workloads to maintain intensity without signs/symptoms of physical distress.    Average METs  3.11      Resistance Training   Training Prescription  No      Treadmill   MPH  2.8    Grade  0    Minutes  0    METs  3.14      Bike   Level  1    Minutes  10    METs  3.6      NuStep   Level  4    SPM  85    Minutes  10    METs  2.6       Nutrition:  Target Goals: Understanding of nutrition guidelines,  daily intake of sodium 1500mg , cholesterol 200mg , calories 30% from fat and 7% or less from saturated fats, daily to have 5 or more servings of fruits and vegetables.  Biometrics: Pre Biometrics - 01/12/18 0843      Pre Biometrics   Height  5\' 4"  (1.626 m)    Weight  158 lb 11.7 oz (72 kg)    Waist Circumference  34 inches    Hip Circumference  40.5 inches    Waist to Hip Ratio  0.84 %    BMI (Calculated)  27.23    Triceps Skinfold  34 mm    % Body Fat  38.7 %    Grip Strength  31 kg    Flexibility  16 in    Single Leg Stand  30 seconds        Nutrition Therapy Plan and Nutrition Goals: Nutrition Therapy & Goals -  01/12/18 1345      Nutrition Therapy   Diet  consistent carbohydrate, heart healthy      Personal Nutrition Goals   Nutrition Goal  pt to identifu and limit food sources of saturated fat, trans fat, and sodium    Personal Goal #2  pt to make good choices when eating out at [place], educate and counsel regarding individualized specific dietary modifications aiming towards targeted core components such as weight, hypertension, lipid management, diabetes, heart failure and other comorbidities.    Expected Outcomes  Short Term Goal: Understand basic principles of dietary content, such as calories, fat, sodium, cholesterol and nutrients.       Nutrition Assessments: Nutrition Assessments - 01/12/18 1348      MEDFICTS Scores   Pre Score  0       Nutrition Goals Re-Evaluation:   Nutrition Goals Re-Evaluation:   Nutrition Goals Discharge (Final Nutrition Goals Re-Evaluation):   Psychosocial: Target Goals: Acknowledge presence or absence of significant depression and/or stress, maximize coping skills, provide positive support system. Participant is able to verbalize types and ability to use techniques and skills needed for reducing stress and depression.  Initial Review & Psychosocial Screening: Initial Psych Review & Screening - 01/12/18 1003      Initial Review   Current issues with  None Identified      Family Dynamics   Good Support System?  Yes friends, family       Barriers   Psychosocial barriers to participate in program  There are no identifiable barriers or psychosocial needs.      Screening Interventions   Interventions  Encouraged to exercise       Quality of Life Scores: Quality of Life - 01/12/18 1004      Quality of Life   Select  Quality of Life      Quality of Life Scores   Health/Function Pre  25.3 %    Socioeconomic Pre  27.2 %    Psych/Spiritual Pre  25.8 %    Family Pre  30 %    GLOBAL Pre   26.51 %      Scores of 19 and below usually indicate a poorer quality of life in these areas.  A difference of  2-3 points is a clinically meaningful difference.  A difference of 2-3 points in the total score of the Quality of Life Index has been associated with significant improvement in overall quality of life, self-image, physical symptoms, and general health in studies assessing change in quality of  life.  PHQ-9: Recent Review Flowsheet Data    Depression screen [facility] 2/9 01/18/2018 11/03/2017   Decreased Interest 0 0   Down, Depressed, Hopeless 0 0   PHQ - 2 Score 0 0     Interpretation of Total Score  Total Score Depression Severity:  1-4 = Minimal depression, 5-9 = Mild depression, 10-14 = Moderate depression, 15-19 = Moderately severe depression, 20-27 = Severe depression   Psychosocial Evaluation and Intervention: Psychosocial Evaluation - 01/18/18 1644      Psychosocial Evaluation & Interventions   Interventions  Encouraged to exercise with the program and follow exercise prescription    Comments  No psychosocial needs identified. No interventions necessary.  Pt enjoys sewing, walking, reading, and going to the movies.     Expected Outcomes  Zahlia will continue to have a positive outlook utilizing good coping skills.     Continue Psychosocial Services   No Follow up required       Psychosocial Re-Evaluation: Psychosocial Re-Evaluation    Eugenio Saenz Name 01/21/18 1611             Psychosocial Re-Evaluation   Current issues with  None Identified       Comments  No psychosocial needs identified. No intervention necessary.         Expected Outcomes  Angelene will exhibit a positive outlook with good coping skills.       Interventions  Encouraged to attend Cardiac Rehabilitation for the exercise       Continue Psychosocial Services   No Follow up required          Psychosocial Discharge (Final Psychosocial Re-Evaluation): Psychosocial Re-Evaluation - 01/21/18 1611       Psychosocial Re-Evaluation   Current issues with  None Identified    Comments  No psychosocial needs identified. No intervention necessary.      Expected Outcomes  Mareli will exhibit a positive outlook with good coping skills.    Interventions  Encouraged to attend Cardiac Rehabilitation for the exercise    Continue Psychosocial Services   No Follow up required       Vocational Rehabilitation: Provide vocational rehab assistance to qualifying candidates.   Vocational Rehab Evaluation & Intervention: Vocational Rehab - 01/12/18 1002      Initial Vocational Rehab Evaluation & Intervention   Assessment shows need for Vocational Rehabilitation  No Independent Consultant        Education: Education Goals: Education classes will be provided on a weekly basis, covering required topics. Participant will state understanding/return demonstration of topics presented.  Learning Barriers/Preferences: Learning Barriers/Preferences - 01/12/18 0915      Learning Barriers/Preferences   Learning Barriers  Sight    Learning Preferences  Verbal Instruction;Written Material       Education Topics: Count Your Pulse:  -Group instruction provided by verbal instruction, demonstration, patient participation and written materials to support subject.  Instructors address importance of being able to find your pulse and how to count your pulse when at home without a heart monitor.  Patients get hands on experience counting their pulse with staff help and individually.   Heart Attack, Angina, and Risk Factor Modification:  -Group instruction provided by verbal instruction, video, and written materials to support subject.  Instructors address signs and symptoms of angina and heart attacks.    Also discuss risk factors for heart disease and how to make changes to improve heart health risk factors.   Functional Fitness:  -Group instruction provided by verbal instruction, demonstration, patient  participation, and written materials to support subject.  Instructors address safety measures for doing things around the house.  Discuss how to get up and down off the floor, how to pick things up properly, how to safely get out of a chair without assistance, and balance training.   Meditation and Mindfulness:  -Group instruction provided by verbal instruction, patient participation, and written materials to support subject.  Instructor addresses importance of mindfulness and meditation practice to help reduce stress and improve awareness.  Instructor also leads participants through a meditation exercise.    Stretching for Flexibility and Mobility:  -Group instruction provided by verbal instruction, patient participation, and written materials to support subject.  Instructors lead participants through series of stretches that are designed to increase flexibility thus improving mobility.  These stretches are additional exercise for major muscle groups that are typically performed during regular warm up and cool down.   Hands Only CPR:  -Group verbal, video, and participation provides a basic overview of AHA guidelines for community CPR. Role-play of emergencies allow participants the opportunity to practice calling for help and chest compression technique with discussion of AED use.   Hypertension: -Group verbal and written instruction that provides a basic overview of hypertension including the most recent diagnostic guidelines, risk factor reduction with self-care instructions and medication management.    Nutrition I class: Heart Healthy Eating:  -Group instruction provided by PowerPoint slides, verbal discussion, and written materials to support subject matter. The instructor gives an explanation and review of the Therapeutic Lifestyle Changes diet recommendations, which includes a discussion on lipid goals, dietary fat, sodium, fiber, plant stanol/sterol esters, sugar, and the components of  a well-balanced, healthy diet.   Nutrition II class: Lifestyle Skills:  -Group instruction provided by PowerPoint slides, verbal discussion, and written materials to support subject matter. The instructor gives an explanation and review of label reading, grocery shopping for heart health, heart healthy recipe modifications, and ways to make healthier choices when eating out.   Diabetes Question & Answer:  -Group instruction provided by PowerPoint slides, verbal discussion, and written materials to support subject matter. The instructor gives an explanation and review of diabetes co-morbidities, pre- and post-prandial blood glucose goals, pre-exercise blood glucose goals, signs, symptoms, and treatment of hypoglycemia and hyperglycemia, and foot care basics.   Diabetes Blitz:  -Group instruction provided by PowerPoint slides, verbal discussion, and written materials to support subject matter. The instructor gives an explanation and review of the physiology behind type 1 and type 2 diabetes, diabetes medications and rational behind using different medications, pre- and post-prandial blood glucose recommendations and Hemoglobin A1c goals, diabetes diet, and exercise including blood glucose guidelines for exercising safely.    Portion Distortion:  -Group instruction provided by PowerPoint slides, verbal discussion, written materials, and food models to support subject matter. The instructor gives an explanation of serving size versus portion size, changes in portions sizes over the last 20 years, and what consists of a serving from each food group.   Stress Management:  -Group instruction provided by verbal instruction, video, and written materials to support subject matter.  Instructors review role of stress in heart disease and how to cope with stress positively.     Exercising on Your Own:  -Group instruction provided by verbal instruction, power point, and written materials to support  subject.  Instructors discuss benefits of exercise, components of exercise, frequency and intensity of exercise, and end points for exercise.  Also discuss use of nitroglycerin and activating EMS.  Review options of places to exercise outside of rehab.  Review guidelines for sex with heart disease.   Cardiac Drugs I:  -Group instruction provided by verbal instruction and written materials to support subject.  Instructor reviews cardiac drug classes: antiplatelets, anticoagulants, beta blockers, and statins.  Instructor discusses reasons, side effects, and lifestyle considerations for each drug class.   Cardiac Drugs II:  -Group instruction provided by verbal instruction and written materials to support subject.  Instructor reviews cardiac drug classes: angiotensin converting enzyme inhibitors (ACE-I), angiotensin II receptor blockers (ARBs), nitrates, and calcium channel blockers.  Instructor discusses reasons, side effects, and lifestyle considerations for each drug class.   Anatomy and Physiology of the Circulatory System:  Group verbal and written instruction and models provide basic cardiac anatomy and physiology, with the coronary electrical and arterial systems. Review of: AMI, Angina, Valve disease, Heart Failure, Peripheral Artery Disease, Cardiac Arrhythmia, Pacemakers, and the ICD.   Other Education:  -Group or individual verbal, written, or video instructions that support the educational goals of the cardiac rehab program.   Holiday Eating Survival Tips:  -Group instruction provided by PowerPoint slides, verbal discussion, and written materials to support subject matter. The instructor gives patients tips, tricks, and techniques to help them not only survive but enjoy the holidays despite the onslaught of food that accompanies the holidays.   Knowledge Questionnaire Score: Knowledge Questionnaire Score - 01/12/18 1002      Knowledge Questionnaire Score   Pre Score  20/24        Core Components/Risk Factors/Patient Goals at Admission: Personal Goals and Risk Factors at Admission - 01/12/18 1112      Core Components/Risk Factors/Patient Goals on Admission    Weight Management  Yes;Weight Maintenance;Weight Loss    Intervention  Weight Management: Develop a combined nutrition and exercise program designed to reach desired caloric intake, while maintaining appropriate intake of nutrient and fiber, sodium and fats, and appropriate energy expenditure required for the weight goal.;Weight Management: Provide education and appropriate resources to help participant work on and attain dietary goals.    Admit Weight  158 lb 11.7 oz (72 kg)    Goal Weight: Short Term  155 lb (70.3 kg)    Goal Weight: Long Term  150 lb (68 kg)    Expected Outcomes  Short Term: Continue to assess and modify interventions until short term weight is achieved;Long Term: Adherence to nutrition and physical activity/exercise program aimed toward attainment of established weight goal;Weight Maintenance: Understanding of the daily nutrition guidelines, which includes 25-35% calories from fat, 7% or less cal from saturated fats, less than 200mg  cholesterol, less than 1.5gm of sodium, & 5 or more servings of fruits and vegetables daily;Understanding recommendations for meals to include 15-35% energy as protein, 25-35% energy from fat, 35-60% energy from carbohydrates, less than 200mg  of dietary cholesterol, 20-35 gm of total fiber daily;Weight Loss: Understanding of general recommendations for a balanced deficit meal plan, which promotes 1-2 lb weight loss per week and includes a negative energy balance of [phone] kcal/d;Understanding of distribution of calorie intake throughout the day with the consumption of 4-5 meals/snacks       Core Components/Risk Factors/Patient Goals Review:  Goals and Risk Factor Review    Row Name 01/18/18 1630             Core Components/Risk Factors/Patient Goals Review    Personal Goals Review  Weight Management/Obesity;Lipids       Review  Nekeisha has little CAD RF.  She  is eager to participate in CR exercise. She wants to be able to get back into her routine.        Expected Outcomes  Pt will continue to participate in CR exercise, nutrition, and lifestyle modification opportunities.           Core Components/Risk Factors/Patient Goals at Discharge (Final Review):  Goals and Risk Factor Review - 01/18/18 1630      Core Components/Risk Factors/Patient Goals Review   Personal Goals Review  Weight Management/Obesity;Lipids    Review  Creasie has little CAD RF.  She is eager to participate in CR exercise. She wants to be able to get back into her routine.     Expected Outcomes  Pt will continue to participate in CR exercise, nutrition, and lifestyle modification opportunities.        ITP Comments: ITP Comments    Row Name 01/11/18 1152 01/18/18 1629 01/21/18 1609       ITP Comments  Dr. Fransico Him, Medical Director   Pt started exercise today.  Chessica reported chest pressure with incline on the treadmill.  VSS and no EKG changes. Per PA, OV f/u warranted before return to exercise.  30 Day ITP Review. Jenin started exercise this week.  She had some chest pressure on the treadmill with incline.  She has f/u appointment on 7/16.          Comments: See ITP Comments

## 2018-01-22 ENCOUNTER — Encounter (HOSPITAL_COMMUNITY): Payer: BLUE CROSS/BLUE SHIELD

## 2018-01-25 ENCOUNTER — Encounter (HOSPITAL_COMMUNITY): Admission: RE | Admit: 2018-01-25 | Payer: BLUE CROSS/BLUE SHIELD | Source: Ambulatory Visit

## 2018-01-25 ENCOUNTER — Encounter (HOSPITAL_COMMUNITY): Payer: BLUE CROSS/BLUE SHIELD

## 2018-01-25 NOTE — Progress Notes (Signed)
Cardiology Office Note   Date:  01/26/2018   ID:  ALLETA Bell, DOB Apr 26, 1961, MRN [id number]  PCP:  Marin Olp, MD  Cardiologist:  Dr. Gwenlyn Found  Chief Complaint  Patient presents with  . office visit    would like to start back to doing cardiac rehab, denies chest pains     History of Present Illness: Cathy Bell is a 57 y.o. female who presents for ongoing assessment and management of CAD, with inferior MI in 09/2017 She has had PCI with DES to the mid RCA which was 100% occluded.Cath revealed an 80% mid LAD lesion with subsequent NM study which was negative.   Her anginal equivalent was a feeling of "dehydration in my throat." and abdominal upset with feelings of having diarrhea. She was seen by Dr. Debara Pickett for lipid management. She has a FH of AAA in her father.   Prior to seeing Dr. Debara Pickett she was placed on high intensity statin therapy with atorvastatin  80 mg daily but did not reach goal of <70 per guidelines, LDL was 91. Zetia 10 mg was added to her regimen. She was to have repeat lipids and LFT;s on follow up.   She comes today with multiple complaints. She states that cardiac rehab is not allowing her to continue until she is seen by cardiology as she is having dyspnea and chest pressure with exercise. The pressure in the chest is not at all similar to angina pain, but she states she feels as if she cannot breath well while walking on treadmill if in an incline. She is able to work her legs and ride the bike, She feels frustrated that she is unable to continue rehab without breathing symptoms.   She also has GI symptoms of burping and has had a hx of IBS and GERD in the past. She has been seen by Dr. Cristina Gong.   Past Medical History:  Diagnosis Date  . Acute ST elevation myocardial infarction (STEMI) of inferior wall ([place]) 09/26/2017   Pt presented 09/26/17 with an acute inferior STEMI. Door to balloon time 30 minutes.   . Coronary artery disease   . Heart disease   .  Hyperlipidemia   . Sleep apnea 2007   uses c pap on occasion    Past Surgical History:  Procedure Laterality Date  . CARDIAC CATHETERIZATION    . CORONARY/GRAFT ACUTE MI REVASCULARIZATION N/A 09/26/2017   Procedure: Coronary/Graft Acute MI Revascularization;  Surgeon: Lorretta Harp, MD;  Location: [facility];  Service: Cardiovascular;  Laterality: N/A;  . LEFT HEART CATH AND CORONARY ANGIOGRAPHY N/A 09/26/2017   Procedure: LEFT HEART CATH AND CORONARY ANGIOGRAPHY;  Surgeon: Lorretta Harp, MD;  Location: [facility];  Service: Cardiovascular;  Laterality: N/A;  . nodulectomy     Salzmann Nodular Degeneration  . STENT PLACEMENT VASCULAR ([place] [facility])  2019  . TONSILLECTOMY AND ADENOIDECTOMY  1967  . WISDOM TOOTH EXTRACTION  1981     Current Outpatient Medications  Medication Sig Dispense Refill  . aspirin 81 MG chewable tablet Chew 1 tablet (81 mg total) by mouth daily. 90 tablet 3  . atorvastatin (LIPITOR) 80 MG tablet Take 1 tablet (80 mg total) by mouth daily at 6 PM. 90 tablet 3  . Cholecalciferol (VITAMIN D PO) Take 5,000 Units by mouth daily.    . Cranberry 1000 MG CAPS Take 1 capsule by mouth daily as needed (yeast infection).    . ezetimibe (ZETIA) 10 MG  tablet Take 1 tablet (10 mg total) by mouth daily. 90 tablet 3  . nitroGLYCERIN (NITROSTAT) 0.4 MG SL tablet Place 1 tablet (0.4 mg total) under the tongue every 5 (five) minutes x 3 doses as needed for chest pain. 25 tablet 3  . OVER THE COUNTER MEDICATION Take 500 mg by mouth daily. D-Mannose    . Probiotic Product (PROBIOTIC ADVANCED PO) Take by mouth.    . sodium chloride (MURO 128) 5 % ophthalmic solution Place 1 drop into both eyes 3 (three) times daily.     . ticagrelor (BRILINTA) 90 MG TABS tablet Take 1 tablet (90 mg total) by mouth 2 (two) times daily. 180 tablet 3  . pantoprazole (PROTONIX) 20 MG tablet Take 1 tablet (20 mg total) by mouth daily. 30 tablet 2   No current facility-administered  medications for this visit.     Allergies:   Bupropion; Cefprozil; Cefprozil; Cheese; Peanut-containing drug products; Prednisolone; Salmon [fish allergy]; and Wellbutrin [bupropion hcl]    Social History:  The patient  reports that she has never smoked. She has never used smokeless tobacco. She reports that she does not drink alcohol or use drugs.   Family History:  The patient's family history includes Atrial fibrillation in her father; Breast cancer in her sister; Colon cancer in her father; Diabetes in her maternal grandfather; Hashimoto's thyroiditis in her sister; Hyperlipidemia in her mother; Other in her mother; Stroke in her son.    ROS: All other systems are reviewed and negative. Unless otherwise mentioned in H&P    PHYSICAL EXAM: VS:  BP (!) 100/58 (BP Location: Left Arm, Patient Position: Sitting)   Pulse 78   Ht 5\' 3"  (1.6 m)   Wt 159 lb 3.2 oz (72.2 kg)   LMP 03/18/2011   SpO2 99%   BMI 28.20 kg/m  , BMI Body mass index is 28.2 kg/m. GEN: Well nourished, well developed, in no acute distress  HEENT: normal  Neck: no JVD, carotid bruits, or masses Cardiac: RRR; no murmurs, rubs, or gallops,no edema  Respiratory:  clear to auscultation bilaterally, normal work of breathing GI: soft, nontender, nondistended, + BS MS: no deformity or atrophy  Skin: warm and dry, no rash Neuro:  Strength and sensation are intact Psych: euthymic mood, full affect   EKG: Not completed.    Recent Labs: 09/26/2017: Magnesium 1.9 11/03/2017: TSH 1.16 11/19/2017: ALT 48; BUN 15; Creatinine, Ser 0.68; Hemoglobin 12.7; Platelets 209.0; Potassium 4.1; Sodium 139    Lipid Panel    Component Value Date/Time   CHOL 240 (H) 09/26/2017 2334   TRIG 61 09/26/2017 2334   HDL 52 09/26/2017 2334   CHOLHDL 4.6 09/26/2017 2334   VLDL 12 09/26/2017 2334   LDLCALC 176 (H) 09/26/2017 2334   LDLDIRECT 91.0 11/03/2017 1451      Wt Readings from Last 3 Encounters:  01/26/18 159 lb 3.2 oz (72.2 kg)   01/13/18 156 lb (70.8 kg)  01/12/18 158 lb 11.7 oz (72 kg)      Other studies Reviewed: Echocardiogram October 06, 2017 Left ventricle: The cavity size was normal. Systolic function was   normal. The estimated ejection fraction was in the range of 55%   to 60%. Wall motion was normal; there were no regional wall   motion abnormalities  Stress test 10/27/2017   Study Highlights     The left ventricular ejection fraction is normal (55-65%).  Nuclear stress EF: 64%. No wall motion abnormalities  There was no ST segment deviation noted  during stress.  Defect 1: There is a small defect of mild severity present in the apex location. This may be secondary to breast attenuation artifact  This is a low risk study. There is no ischemia identified. There does not appear to be an infarct pattern in the inferior wall.     Cardiac Cath 09/26/2017  Conclusion     Mid RCA lesion is 100% stenosed.  Prox LAD lesion is 80% stenosed.  A stent was successfully placed.  Post intervention, there is a 0% residual stenosis.  There is mild left ventricular systolic dysfunction.  LV end diastolic pressure is normal.  The left ventricular ejection fraction is 45-50% by visual estimate.     ASSESSMENT AND PLAN:  1. CAD: Cath as above with repeat stress test revealing know new areas of ischemia.  She states that she was told not to continue cardiac rehab due to symptoms of dyspnea during exercise. I do not see documentation about this from cardiac rehab, only phone note from the patient asking about this.   I think she can proceed with low level exercise on flat service without incline on a treadmill for about 15 minutes. If she becomes tired she can rest. She has has a low risk stress test in April and therefore I think she can proceed with low level exercise.   However with her breathing status being an issue I will evaluate further.  She has a history of OSA and has not worn CPAP since 2007.  She stopped wearing it due to travel and it being too cumbersome to bring with her. I have ordered a repeat sleep study to assess need for re-institution of her CPAP.   Meanwhile she will continue current medication regimen.   2.GI symptoms of GERD: Has not been seen by GI in many years. Will refer to back to previous GI physician Dr. Ronald Lobo. Protonix 20 mg daily will be ordered.   3. Hypercholesterolemia: She is on statin and Zetia. She is already planned to have follow up labs in one week. Will add Mg to her labs in as she will be on PPI.     Current medicines are reviewed at length with the patient today.  I have answered multiple questions and spent greater than 30 minutes with this patient today.   Labs/ tests ordered today include: Sleep study. Mg+ BMET, CBC and Lipids.  Phill Myron. West Pugh, ANP, AACC   01/26/2018 12:20 PM    [facility] [address] [place], [place] [address] Phone: [phone]; Fax: [phone]

## 2018-01-26 ENCOUNTER — Encounter: Payer: Self-pay | Admitting: Adult Health

## 2018-01-26 ENCOUNTER — Ambulatory Visit (INDEPENDENT_AMBULATORY_CARE_PROVIDER_SITE_OTHER): Payer: BLUE CROSS/BLUE SHIELD | Admitting: Adult Health

## 2018-01-26 VITALS — BP 100/58 | HR 78 | Ht 63.0 in | Wt 159.2 lb

## 2018-01-26 DIAGNOSIS — K219 Gastro-esophageal reflux disease without esophagitis: Secondary | ICD-10-CM | POA: Diagnosis not present

## 2018-01-26 DIAGNOSIS — R0602 Shortness of breath: Secondary | ICD-10-CM | POA: Diagnosis not present

## 2018-01-26 DIAGNOSIS — E7800 Pure hypercholesterolemia, unspecified: Secondary | ICD-10-CM

## 2018-01-26 DIAGNOSIS — I2510 Atherosclerotic heart disease of native coronary artery without angina pectoris: Secondary | ICD-10-CM

## 2018-01-26 DIAGNOSIS — K589 Irritable bowel syndrome without diarrhea: Secondary | ICD-10-CM

## 2018-01-26 DIAGNOSIS — G4733 Obstructive sleep apnea (adult) (pediatric): Secondary | ICD-10-CM | POA: Diagnosis not present

## 2018-01-26 DIAGNOSIS — Z79899 Other long term (current) drug therapy: Secondary | ICD-10-CM

## 2018-01-26 MED ORDER — PANTOPRAZOLE SODIUM 20 MG PO TBEC: 20.0000 mg | DELAYED_RELEASE_TABLET | Freq: Every day | ORAL | 2 refills | Status: DC

## 2018-01-26 NOTE — Patient Instructions (Signed)
Medication Instructions:  NO CHANGES- Your physician recommends that you continue on your current medications as directed. Please refer to the Current Medication list given to you today.  If you need a refill on your cardiac medications before your next appointment, please call your pharmacy.  Labwork: FLP,LFT,BMET AND CBC FASTING HERE IN OUR OFFICE AT [facility]  Take the provided lab slips with you to the lab for your blood draw.   You will need to fast. DO NOT EAT OR DRINK PAST MIDNIGHT.   Special Instructions: REFERRAL TO GI WITH DR BUCCINI-@[facility]   Your physician has recommended that you have a sleep study. This test records several body functions during sleep, including: brain activity, eye movement, oxygen and carbon dioxide blood levels, heart rate and rhythm, breathing rate and rhythm, the flow of air through your mouth and nose, snoring, body muscle movements, and chest and belly movement.    Follow-Up: Your physician wants you to follow-up in: [place]    Thank you for choosing [facility] at [facility]!!

## 2018-01-27 ENCOUNTER — Telehealth: Payer: Self-pay | Admitting: *Deleted

## 2018-01-27 ENCOUNTER — Telehealth (HOSPITAL_COMMUNITY): Payer: Self-pay

## 2018-01-27 ENCOUNTER — Encounter (HOSPITAL_COMMUNITY): Payer: BLUE CROSS/BLUE SHIELD

## 2018-01-27 NOTE — Telephone Encounter (Signed)
Sleep study PA request sent to [facility] via web portal.

## 2018-01-27 NOTE — Telephone Encounter (Signed)
Received in basket from Dr.Berry's NP stated patient is not cleared to return to Cardiac Rehab. They have ordered another sleep test for patient and referred back to GI Dr. Dot Been message to nurse.

## 2018-01-28 DIAGNOSIS — Z79899 Other long term (current) drug therapy: Secondary | ICD-10-CM | POA: Diagnosis not present

## 2018-01-28 LAB — BASIC METABOLIC PANEL
BUN/Creatinine Ratio: 13 (ref 9–23)
BUN: 10 mg/dL (ref 6–24)
CALCIUM: 9.5 mg/dL (ref 8.7–10.2)
CO2: 23 mmol/L (ref 20–29)
Chloride: 101 mmol/L (ref 96–106)
Creatinine, Ser: 0.75 mg/dL (ref 0.57–1.00)
GFR calc Af Amer: 102 mL/min/{1.73_m2} (ref >59)
GFR calc non Af Amer: 88 mL/min/{1.73_m2} (ref >59)
Glucose: 90 mg/dL (ref 65–99)
Potassium: 4.2 mmol/L (ref 3.5–5.2)
Sodium: 140 mmol/L (ref 134–144)

## 2018-01-28 LAB — MAGNESIUM: Magnesium: 2.1 mg/dL (ref 1.6–2.3)

## 2018-01-28 LAB — HEPATIC FUNCTION PANEL
ALT: 22 IU/L (ref 0–32)
AST: 18 IU/L (ref 0–40)
Albumin: 4.6 g/dL (ref 3.5–5.5)
Alkaline Phosphatase: 114 IU/L (ref 39–117)
BILIRUBIN TOTAL: 0.6 mg/dL (ref 0.0–1.2)
BILIRUBIN, DIRECT: 0.18 mg/dL (ref 0.00–0.40)
Total Protein: 6.7 g/dL (ref 6.0–8.5)

## 2018-01-28 LAB — LIPID PANEL
Chol/HDL Ratio: 2.4 ratio (ref 0.0–4.4)
Cholesterol, Total: 121 mg/dL (ref 100–199)
HDL: 51 mg/dL (ref >39)
LDL Calculated: 57 mg/dL (ref 0–99)
TRIGLYCERIDES: 64 mg/dL (ref 0–149)
VLDL Cholesterol Cal: 13 mg/dL (ref 5–40)

## 2018-01-28 LAB — CBC
HEMATOCRIT: 39.8 % (ref 34.0–46.6)
HEMOGLOBIN: 13.7 g/dL (ref 11.1–15.9)
MCH: 29.6 pg (ref 26.6–33.0)
MCHC: 34.4 g/dL (ref 31.5–35.7)
MCV: 86 fL (ref 79–97)
Platelets: 221 10*3/uL (ref 150–450)
RBC: 4.63 x10E6/uL (ref 3.77–5.28)
RDW: 14.2 % (ref 12.3–15.4)
WBC: 7.5 10*3/uL (ref 3.4–10.8)

## 2018-01-29 ENCOUNTER — Encounter (HOSPITAL_COMMUNITY): Payer: BLUE CROSS/BLUE SHIELD

## 2018-01-29 ENCOUNTER — Other Ambulatory Visit: Payer: Self-pay | Admitting: Adult Health

## 2018-01-29 ENCOUNTER — Telehealth: Payer: Self-pay | Admitting: *Deleted

## 2018-01-29 DIAGNOSIS — G4733 Obstructive sleep apnea (adult) (pediatric): Secondary | ICD-10-CM

## 2018-01-29 NOTE — Telephone Encounter (Signed)
Left message to return a call to discuss sleep study appointment. 

## 2018-01-29 NOTE — Telephone Encounter (Signed)
Patient returned a call to me and was given HST appointment information.

## 2018-02-01 ENCOUNTER — Encounter (HOSPITAL_COMMUNITY): Admission: RE | Admit: 2018-02-01 | Payer: BLUE CROSS/BLUE SHIELD | Source: Ambulatory Visit

## 2018-02-01 ENCOUNTER — Encounter (HOSPITAL_COMMUNITY): Payer: BLUE CROSS/BLUE SHIELD

## 2018-02-03 ENCOUNTER — Encounter (HOSPITAL_COMMUNITY): Payer: BLUE CROSS/BLUE SHIELD

## 2018-02-05 ENCOUNTER — Encounter (HOSPITAL_COMMUNITY): Payer: BLUE CROSS/BLUE SHIELD

## 2018-02-06 ENCOUNTER — Encounter: Payer: Self-pay | Admitting: Cardiovascular Disease

## 2018-02-08 ENCOUNTER — Encounter (HOSPITAL_COMMUNITY): Payer: BLUE CROSS/BLUE SHIELD

## 2018-02-10 ENCOUNTER — Encounter (HOSPITAL_COMMUNITY): Payer: BLUE CROSS/BLUE SHIELD

## 2018-02-10 ENCOUNTER — Encounter: Payer: Self-pay | Admitting: Cardiovascular Disease

## 2018-02-10 ENCOUNTER — Ambulatory Visit (INDEPENDENT_AMBULATORY_CARE_PROVIDER_SITE_OTHER): Payer: BLUE CROSS/BLUE SHIELD | Admitting: Cardiovascular Disease

## 2018-02-10 VITALS — BP 110/68 | HR 72 | Ht 63.0 in | Wt 157.6 lb

## 2018-02-10 DIAGNOSIS — Z01812 Encounter for preprocedural laboratory examination: Secondary | ICD-10-CM

## 2018-02-10 DIAGNOSIS — I200 Unstable angina: Secondary | ICD-10-CM | POA: Diagnosis not present

## 2018-02-10 NOTE — Assessment & Plan Note (Addendum)
Status post inferior STEMI walking in [place] Saturday morning.occluded RCA her EF was preserved.  She did have an 80% mid LAD lesion which I did not intervene on subsequent Myoview stress test was nonischemic.  She remains on dual antiplatelet therapy.  She says that while walking up [place] or doing strenuous activities she does feel some increasing shortness of breath and chest heaviness.  I am going to get a coronary CTA/FFR to further evaluate her mid LAD lesion.

## 2018-02-10 NOTE — Patient Instructions (Addendum)
Medication Instructions:  Your physician recommends that you continue on your current medications as directed. Please refer to the Current Medication list given to you today.  Labwork: Your physician recommends that you return for lab work in: 1 before scheduled test.   Testing/Procedures: Please arrive at the [facility] [place] main entrance of [facility] at____________________30-45 minutes prior to test start time)  [facility] [place], [place] [address] [phone]  Proceed to the [facility] Radiology Department (First Floor).  Please follow these instructions carefully (unless otherwise directed):   On the Night Before the Test: . Drink plenty of water. . Do not consume any caffeinated/decaffeinated beverages or chocolate 12 hours prior to your test. . Do not take any antihistamines 12 hours prior to your test.  On the Day of the Test: . Drink plenty of water. Do not drink any water within one hour of the test. . Do not eat any food 4 hours prior to the test. . You may take your regular medications prior to the test.  After the Test: . Drink plenty of water. . After receiving IV contrast, you may experience a mild flushed feeling. This is normal. . On occasion, you may experience a mild rash up to 24 hours after the test. This is not dangerous. If this occurs, you can take Benadryl 25 mg and increase your fluid intake. . If you experience trouble breathing, this can be serious. If it is severe call 911 IMMEDIATELY. If it is mild, please call our office. . If you take any of these medications: Glipizide/Metformin, Avandament, Glucavance, please do not take 48 hours after completing test.   Follow-Up: Your physician wants you to follow-up in: 6 months with Dr. Andria Rhein will receive a reminder letter in the mail two months in advance. If you don't receive a letter, please call our office to schedule the follow-up appointment.   Any Other  Special Instructions Will Be Listed Below (If Applicable).     If you need a refill on your cardiac medications before your next appointment, please call your pharmacy.

## 2018-02-10 NOTE — Assessment & Plan Note (Signed)
History of hyperlipidemia on high-dose statin therapy markedly improved with the addition of Zetia her LDL fell from a high of 187 down to 57 on 01/28/2018 with improvement in her LFTs as well.

## 2018-02-10 NOTE — Progress Notes (Signed)
02/10/2018 Cathy Bell   1960-09-24  [id number]  Primary Physician Yong Channel Brayton Mars, MD Primary Cardiologist: Lorretta Harp MD FACP, [place], [place], [place]  HPI:  Cathy Bell is a 57 y.o.  moderately overweight divorced Caucasian female who I last saw in the office 11/10/2017.  She presented on 09/26/2017 with chest pain while walking in the park.  EKG showed inferior ST segment elevation with reciprocal anterior depression.  I brought her to the Cath Lab on Saturday morning and opened up an occluded mid RCA with a synergy drug-eluting stent.  She did have inferobasal hypokinesia which improved by 2D echo the following day.  She did have an 80% mid LAD lesion with a subsequent negative Myoview stress test.  Her major complaints are of fatigue.  Her other problems include hyperlipidemia on high-dose statin therapy with mildly elevated liver function tests.  She is on dual antiplatelet therapy with Brilinta. Since I saw her last she has seen Jory Sims NP as well with some complaints of exertional chest pressure and shortness of breath.      Current Meds  Medication Sig  . aspirin 81 MG chewable tablet Chew 1 tablet (81 mg total) by mouth daily.  Marland Kitchen atorvastatin (LIPITOR) 80 MG tablet Take 1 tablet (80 mg total) by mouth daily at 6 PM.  . Cholecalciferol (VITAMIN D PO) Take 5,000 Units by mouth daily.  . Cranberry 1000 MG CAPS Take 1 capsule by mouth daily as needed (yeast infection).  . ezetimibe (ZETIA) 10 MG tablet Take 1 tablet (10 mg total) by mouth daily.  . nitroGLYCERIN (NITROSTAT) 0.4 MG SL tablet Place 1 tablet (0.4 mg total) under the tongue every 5 (five) minutes x 3 doses as needed for chest pain.  Marland Kitchen OVER THE COUNTER MEDICATION Take 500 mg by mouth daily. D-Mannose  . pantoprazole (PROTONIX) 20 MG tablet Take 1 tablet (20 mg total) by mouth daily.  . Probiotic Product (PROBIOTIC ADVANCED PO) Take by mouth.  . sodium chloride (MURO 128) 5 % ophthalmic solution  Place 1 drop into both eyes 3 (three) times daily.   . ticagrelor (BRILINTA) 90 MG TABS tablet Take 1 tablet (90 mg total) by mouth 2 (two) times daily.     Allergies  Allergen Reactions  . Bupropion Hives    Other reaction(s): Unknown  . Cefprozil Hives  . Cefprozil Hives and Other (See Comments)  . Cheese Hives  . Peanut-Containing Drug Products Hives  . Prednisolone     Caused dangerously high eye pressure around time of eye surgery  . Salmon [Fish Allergy] Hives  . Wellbutrin [Bupropion Hcl] Hives    Social History   Socioeconomic History  . Marital status: Divorced    Spouse name: Not on file  . Number of children: 2  . Years of education: Not on file  . Highest education level: Master's degree (e.g., MA, MS, MEng, MEd, MSW, MBA)  Occupational History  . Occupation: Optometrist  Social Needs  . Financial resource strain: Not hard at all  . Food insecurity:    Worry: Never true    Inability: Not on file  . Transportation needs:    Medical: No    Non-medical: No  Tobacco Use  . Smoking status: Never Smoker  . Smokeless tobacco: Never Used  Substance and Sexual Activity  . Alcohol use: No  . Drug use: No  . Sexual activity: Not Currently  Lifestyle  . Physical activity:    Days  per week: 3 days    Minutes per session: 30 min  . Stress: Not on file  Relationships  . Social connections:    Talks on phone: Not on file    Gets together: Not on file    Attends religious service: Not on file    Active member of club or organization: Not on file    Attends meetings of clubs or organizations: Not on file    Relationship status: Not on file  . Intimate partner violence:    Fear of current or ex partner: Not on file    Emotionally abused: Not on file    Physically abused: Not on file    Forced sexual activity: Not on file  Other Topics Concern  . Not on file  Social History Narrative   Divorced. 2 sons [name] 26 and [name]. No grandkids yet.       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 Graduate school at [employer]: walking, surface design- Financial planner, play mahjong, movies, reading, travel     Review of Systems: General: negative for chills, fever, night sweats or weight changes.  Cardiovascular: negative for chest pain, dyspnea on exertion, edema, orthopnea, palpitations, paroxysmal nocturnal dyspnea or shortness of breath Dermatological: negative for rash Respiratory: negative for cough or wheezing Urologic: negative for hematuria Abdominal: negative for nausea, vomiting, diarrhea, bright red blood per rectum, melena, or hematemesis Neurologic: negative for visual changes, syncope, or dizziness All other systems reviewed and are otherwise negative except as noted above.    Blood pressure 110/68, pulse 72, height 5\' 3"  (1.6 m), weight 157 lb 9.6 oz (71.5 kg), last menstrual period 03/18/2011.  General appearance: alert and no distress Neck: no adenopathy, no carotid bruit, no JVD, supple, symmetrical, trachea midline and thyroid not enlarged, symmetric, no tenderness/mass/nodules Lungs: clear to auscultation bilaterally Heart: regular rate and rhythm, S1, S2 normal, no murmur, click, rub or gallop Extremities: extremities normal, atraumatic, no cyanosis or edema Pulses: 2+ and symmetric Skin: Skin color, texture, turgor normal. No rashes or lesions Neurologic: Alert and oriented X 3, normal strength and tone. Normal symmetric reflexes. Normal coordination and gait  EKG not performed today  ASSESSMENT AND PLAN:   Acute ST elevation myocardial infarction (STEMI) of inferior wall (HCC) Status post inferior STEMI walking in [place] Saturday morning.occluded RCA her EF was preserved.  She did have an 80% mid LAD lesion which I did not intervene on subsequent Myoview stress test was nonischemic.  She  remains on dual antiplatelet therapy.  She says that while walking up [place] or doing strenuous activities she does feel some increasing shortness of breath and chest heaviness.  I am going to get a coronary CTA/FFR to further evaluate her mid LAD lesion.  Familial hyperlipidemia History of hyperlipidemia on high-dose statin therapy markedly improved with the addition of Zetia her LDL fell from a high of 187 down to 57 on 01/28/2018 with improvement in her LFTs as well.      Lorretta Harp MD FACP,FACC,FAHA, [facility] 02/10/2018 1:51 PM

## 2018-02-12 ENCOUNTER — Encounter (HOSPITAL_COMMUNITY): Payer: BLUE CROSS/BLUE SHIELD

## 2018-02-15 ENCOUNTER — Encounter (HOSPITAL_COMMUNITY): Payer: BLUE CROSS/BLUE SHIELD

## 2018-02-17 ENCOUNTER — Encounter (HOSPITAL_COMMUNITY): Payer: BLUE CROSS/BLUE SHIELD

## 2018-02-17 ENCOUNTER — Telehealth (HOSPITAL_COMMUNITY): Payer: Self-pay | Admitting: *Deleted

## 2018-02-17 NOTE — Telephone Encounter (Addendum)
Called Lanny to discuss CR. Pt discharged from [facility] due to ongoing follow up for chest pressure and SOB after two sessions of exercise.  Enis is currently awaiting further testing with a CT and sleep study.  Pt will return upon clearance from MD.

## 2018-02-18 NOTE — Addendum Note (Signed)
Encounter addended by: Carma Lair on: 02/18/2018 1:48 PM  Actions taken: Visit Navigator Flowsheet section accepted

## 2018-02-19 ENCOUNTER — Encounter (HOSPITAL_COMMUNITY): Payer: BLUE CROSS/BLUE SHIELD

## 2018-02-19 NOTE — Addendum Note (Signed)
Encounter addended by: Ivonne Andrew, RD on: 02/19/2018 10:28 AM  Actions taken: Flowsheet data copied forward, Visit Navigator Flowsheet section accepted

## 2018-02-22 ENCOUNTER — Encounter (HOSPITAL_COMMUNITY): Payer: BLUE CROSS/BLUE SHIELD

## 2018-02-24 ENCOUNTER — Encounter (HOSPITAL_COMMUNITY): Payer: BLUE CROSS/BLUE SHIELD

## 2018-02-24 ENCOUNTER — Ambulatory Visit (HOSPITAL_BASED_OUTPATIENT_CLINIC_OR_DEPARTMENT_OTHER): Payer: BLUE CROSS/BLUE SHIELD

## 2018-02-25 ENCOUNTER — Telehealth (HOSPITAL_COMMUNITY): Payer: Self-pay | Admitting: Cardiovascular Disease

## 2018-02-25 NOTE — Telephone Encounter (Signed)
Informed patient that she would need a BMET before her CT. Patient will come in for BMET in the next couple of weeks.

## 2018-02-25 NOTE — Telephone Encounter (Signed)
New message  Patient is wanting to know if she needs to lab work before the CT test. Please advise.

## 2018-02-26 ENCOUNTER — Encounter (HOSPITAL_COMMUNITY): Payer: BLUE CROSS/BLUE SHIELD

## 2018-03-01 ENCOUNTER — Encounter (HOSPITAL_COMMUNITY): Payer: BLUE CROSS/BLUE SHIELD

## 2018-03-02 ENCOUNTER — Ambulatory Visit (HOSPITAL_BASED_OUTPATIENT_CLINIC_OR_DEPARTMENT_OTHER): Payer: BLUE CROSS/BLUE SHIELD | Attending: Adult Health

## 2018-03-03 ENCOUNTER — Encounter (HOSPITAL_COMMUNITY): Payer: BLUE CROSS/BLUE SHIELD

## 2018-03-03 DIAGNOSIS — Z01812 Encounter for preprocedural laboratory examination: Secondary | ICD-10-CM | POA: Diagnosis not present

## 2018-03-03 DIAGNOSIS — I200 Unstable angina: Secondary | ICD-10-CM | POA: Diagnosis not present

## 2018-03-03 LAB — BASIC METABOLIC PANEL
BUN/Creatinine Ratio: 18 (ref 9–23)
BUN: 12 mg/dL (ref 6–24)
CO2: 24 mmol/L (ref 20–29)
Calcium: 9.8 mg/dL (ref 8.7–10.2)
Chloride: 102 mmol/L (ref 96–106)
Creatinine, Ser: 0.66 mg/dL (ref 0.57–1.00)
GFR calc Af Amer: 113 mL/min/{1.73_m2} (ref >59)
GFR calc non Af Amer: 98 mL/min/{1.73_m2} (ref >59)
GLUCOSE: 98 mg/dL (ref 65–99)
POTASSIUM: 5.3 mmol/L — AB (ref 3.5–5.2)
SODIUM: 140 mmol/L (ref 134–144)

## 2018-03-05 ENCOUNTER — Encounter (HOSPITAL_COMMUNITY): Payer: BLUE CROSS/BLUE SHIELD

## 2018-03-08 ENCOUNTER — Encounter (HOSPITAL_COMMUNITY): Payer: BLUE CROSS/BLUE SHIELD

## 2018-03-08 ENCOUNTER — Encounter: Payer: Self-pay | Admitting: *Deleted

## 2018-03-10 ENCOUNTER — Encounter (HOSPITAL_COMMUNITY): Payer: BLUE CROSS/BLUE SHIELD

## 2018-03-11 ENCOUNTER — Ambulatory Visit (INDEPENDENT_AMBULATORY_CARE_PROVIDER_SITE_OTHER): Payer: BLUE CROSS/BLUE SHIELD | Admitting: Family Medicine

## 2018-03-11 ENCOUNTER — Encounter: Payer: Self-pay | Admitting: Family Medicine

## 2018-03-11 VITALS — BP 120/62 | HR 66 | Temp 98.0°F | Ht 63.0 in | Wt 157.0 lb

## 2018-03-11 DIAGNOSIS — R5383 Other fatigue: Secondary | ICD-10-CM

## 2018-03-11 DIAGNOSIS — E663 Overweight: Secondary | ICD-10-CM

## 2018-03-11 DIAGNOSIS — E7849 Other hyperlipidemia: Secondary | ICD-10-CM

## 2018-03-11 DIAGNOSIS — E875 Hyperkalemia: Secondary | ICD-10-CM | POA: Diagnosis not present

## 2018-03-11 DIAGNOSIS — Z9861 Coronary angioplasty status: Secondary | ICD-10-CM

## 2018-03-11 DIAGNOSIS — I2510 Atherosclerotic heart disease of native coronary artery without angina pectoris: Secondary | ICD-10-CM

## 2018-03-11 DIAGNOSIS — N951 Menopausal and female climacteric states: Secondary | ICD-10-CM | POA: Diagnosis not present

## 2018-03-11 LAB — BASIC METABOLIC PANEL
BUN: 12 mg/dL (ref 6–23)
CALCIUM: 9.4 mg/dL (ref 8.4–10.5)
CO2: 30 meq/L (ref 19–32)
Chloride: 105 mEq/L (ref 96–112)
Creatinine, Ser: 0.69 mg/dL (ref 0.40–1.20)
GFR: 92.8 mL/min (ref >60.00)
GLUCOSE: 104 mg/dL — AB (ref 70–99)
Potassium: 4.3 mEq/L (ref 3.5–5.1)
SODIUM: 140 meq/L (ref 135–145)

## 2018-03-11 LAB — VITAMIN B12: VITAMIN B 12: 584 pg/mL (ref 211–911)

## 2018-03-11 NOTE — Assessment & Plan Note (Signed)
S: last visit after stopping HRT_ had hot flashes waking her from sleep. We went through herbal options and really no great choices.   In the end we discussed acupuncture which had helped her int he past A/P: hasn't been able to get the acupuncture but luckily hot flashes havent been as bad. She is limiting caffeine intake- no iced tea after noon

## 2018-03-11 NOTE — Assessment & Plan Note (Signed)
S: patients shortness of breath continues- so getting further workup per cardiology- could be related to Irene. No chest pain. She remains on asa, statin, brilinta A/P: continue current rx and cardiology follow up

## 2018-03-11 NOTE — Assessment & Plan Note (Signed)
S: well controlled on last check with LDL 57 on max dose atorvastatin 80mg  now along with zetia.  Lab Results  Component Value Date   CHOL 121 01/28/2018   HDL 51 01/28/2018   LDLCALC 57 01/28/2018   LDLDIRECT 91.0 11/03/2017   TRIG 64 01/28/2018   CHOLHDL 2.4 01/28/2018   A/P: doing well, continue current rx

## 2018-03-11 NOTE — Progress Notes (Signed)
Subjective:  Cathy Bell is a 57 y.o. year old very pleasant female patient who presents for/with See problem oriented charting ROS- continued shortness of breath. No chest pain- except some mild pressure with stady walking incline- but also more sedentary. No edema. Some hot flashes   Past Medical History-  Patient Active Problem List   Diagnosis Date Noted  . CAD S/P percutaneous coronary angioplasty 10/20/2017    Priority: High  . Hot flashes due to menopause 12/08/2017    Priority: Medium  . History of gestational diabetes 11/03/2017    Priority: Medium  . Overweight (BMI 25.0-29.9) 11/03/2017    Priority: Medium  . Familial hyperlipidemia     Priority: Medium  . Brain lesion 11/03/2017    Priority: Low  . Multiple thyroid nodules 11/03/2017    Priority: Low  . Rosacea 11/03/2017    Priority: Low  . Exertional chest pain 10/20/2017    Priority: Low  . FATIGUE, CHRONIC 03/10/2007    Priority: Low  . Acute ST elevation myocardial infarction (STEMI) of inferior wall (HCC) 09/26/2017    Medications- reviewed and updated Current Outpatient Medications  Medication Sig Dispense Refill  . aspirin 81 MG chewable tablet Chew 1 tablet (81 mg total) by mouth daily. 90 tablet 3  . atorvastatin (LIPITOR) 80 MG tablet Take 1 tablet (80 mg total) by mouth daily at 6 PM. 90 tablet 3  . Cholecalciferol (VITAMIN D PO) Take 5,000 Units by mouth daily.    . Cranberry 1000 MG CAPS Take 1 capsule by mouth daily as needed (yeast infection).    . ezetimibe (ZETIA) 10 MG tablet Take 1 tablet (10 mg total) by mouth daily. 90 tablet 3  . nitroGLYCERIN (NITROSTAT) 0.4 MG S L tablet Place 1 tablet (0.4 mg total) under the tongue every 5 (five) minutes x 3 doses as needed for chest pain. 25 tablet 3  . OVER THE COUNTER MEDICATION Take 500 mg by mouth daily. D-Mannose    . Probiotic Product (PROBIOTIC ADVANCED PO) Take by mouth.    . sodium chloride (MURO 128) 5 % ophthalmic solution Place 1  drop into both eyes 3 (three) times daily.     . ticagrelor (BRILINTA) 90 MG TABS tablet Take 1 tablet (90 mg total) by mouth 2 (two) times daily. 180 tablet 3   Objective: BP 120/62 (BP Location: Left Arm, Patient Position: Sitting, Cuff Size: Normal)   Pulse 66   Temp 98 F (36.7 C) (Oral)   Ht 5\' 3"  (1.6 m)   Wt 157 lb (71.2 kg)   LMP 03/18/2011   SpO2 98%   BMI 27.81 kg/m  Gen: NAD, resting comfortably CV: RRR no murmurs rubs or gallops Lungs: CTAB no crackles, wheeze, rhonchi Abdomen: soft/nontender Ext: no edema Skin: warm, dry  Assessment/Plan:  CAD S/P percutaneous coronary angioplasty S: patients shortness of breath continues- so getting further workup per cardiology- could be related to brilinta. No chest pain. She remains on asa, statin, brilinta A/P: continue current rx and cardiology follow up   Familial hyperlipidemia S: well controlled on last check with LDL 57 on max dose atorvastatin 80mg  now along with zetia.  Lab Results  Component Value Date   CHOL 121 01/28/2018   HDL 51 01/28/2018   LDLCALC 57 01/28/2018   LDLDIRECT 91.0 11/03/2017   TRIG 64 01/28/2018   CHOLHDL 2.4 01/28/2018   A/P: doing well, continue current rx   Hot flashes due to menopause S: last visit after stopping  HRT_ had hot flashes waking her from sleep. We went through herbal options and really no great choices.   In the end we discussed acupuncture which had helped her int he past A/P: hasn't been able to get the acupuncture but luckily hot flashes havent been as bad. She is limiting caffeine intake- no iced tea after noon  Overweight (BMI 25.0-29.9) S:  162 lbs in may- today at 157, goal was 2-5 lbs down . She brings chart of calorie intake daily- goal 1500 calories particularly at home, travel is hard. She has not been exercising as cardiology recommended against until gets CT scan with cardiology Wt Readings from Last 3 Encounters:  03/11/18 157 lb (71.2 kg)  02/10/18 157 lb  9.6 oz (71.5 kg)  01/26/18 159 lb 3.2 oz (72.2 kg)  A/P: thrilled with progress especially with restrictions on exercise and her having more travel lately.   Also had high potassium last labs- recheck today. Also with some fatigue- wants to know b12 level as apparently has a lot of family history of low b12  Future Appointments  Date Time Provider [place]  03/25/2018  3:00 PM Debbe Mounts, PhD [facility]  03/26/2018  1:30 PM [facility] 1 [facility]  03/26/2018  2:00 PM [facility] 1 [facility]  05/20/2018  8:00 AM Hilty, Nadean Corwin, MD [facility]   Lab/Order associations: Hyperkalemia - Plan: Basic metabolic panel  Fatigue, unspecified type - Plan: Vitamin B12  CAD S/P percutaneous coronary angioplasty  Familial hyperlipidemia  Hot flashes due to menopause  Overweight (BMI 25.0-29.9)  Return precautions advised.  Garret Reddish, MD

## 2018-03-11 NOTE — Assessment & Plan Note (Signed)
S:  162 lbs in may- today at 157, goal was 2-5 lbs down . She brings chart of calorie intake daily- goal 1500 calories particularly at home, travel is hard. She has not been exercising as cardiology recommended against until gets CT scan with cardiology Wt Readings from Last 3 Encounters:  03/11/18 157 lb (71.2 kg)  02/10/18 157 lb 9.6 oz (71.5 kg)  01/26/18 159 lb 3.2 oz (72.2 kg)  A/P: thrilled with progress especially with restrictions on exercise and her having more travel lately.

## 2018-03-11 NOTE — Patient Instructions (Addendum)
Health Maintenance Due  Topic Date Due  . COLONOSCOPY -to be done at a later date- at least a year out from stent placement 08/04/2016  . INFLUENZA VACCINE -please call our office to schedule in October/November 02/11/2018   Please stop by lab before you go  With all things considered including restrictions on exercise- fantastic job dropping 5 lbs! Lets set a another goal of 2-5 within 3-6 months and then would love to check back in (after your cardiac rehab)

## 2018-03-12 ENCOUNTER — Encounter (HOSPITAL_COMMUNITY): Payer: BLUE CROSS/BLUE SHIELD

## 2018-03-17 ENCOUNTER — Encounter (HOSPITAL_COMMUNITY): Payer: BLUE CROSS/BLUE SHIELD

## 2018-03-17 ENCOUNTER — Encounter: Payer: Self-pay | Admitting: Family Medicine

## 2018-03-19 ENCOUNTER — Encounter (HOSPITAL_COMMUNITY): Payer: BLUE CROSS/BLUE SHIELD

## 2018-03-21 ENCOUNTER — Encounter: Payer: Self-pay | Admitting: Family Medicine

## 2018-03-22 ENCOUNTER — Encounter (HOSPITAL_COMMUNITY): Payer: BLUE CROSS/BLUE SHIELD

## 2018-03-23 ENCOUNTER — Encounter: Payer: Self-pay | Admitting: Family Medicine

## 2018-03-24 ENCOUNTER — Ambulatory Visit (INDEPENDENT_AMBULATORY_CARE_PROVIDER_SITE_OTHER): Payer: BLUE CROSS/BLUE SHIELD | Admitting: Physician Assistant

## 2018-03-24 ENCOUNTER — Telehealth: Payer: Self-pay | Admitting: *Deleted

## 2018-03-24 ENCOUNTER — Encounter: Payer: Self-pay | Admitting: Physician Assistant

## 2018-03-24 ENCOUNTER — Encounter (HOSPITAL_COMMUNITY): Payer: BLUE CROSS/BLUE SHIELD

## 2018-03-24 VITALS — BP 110/70 | HR 88 | Temp 98.6°F | Ht 63.0 in | Wt 154.4 lb

## 2018-03-24 DIAGNOSIS — H9202 Otalgia, left ear: Secondary | ICD-10-CM

## 2018-03-24 DIAGNOSIS — J040 Acute laryngitis: Secondary | ICD-10-CM

## 2018-03-24 MED ORDER — AZITHROMYCIN 250 MG PO TABS: ORAL_TABLET | ORAL | 0 refills | Status: DC

## 2018-03-24 NOTE — Progress Notes (Signed)
Cathy Bell is a 57 y.o. female here for a new problem.  I acted as a Education administrator for [employer], PA-C Anselmo Pickler, LPN  History of Present Illness:   Chief Complaint  Patient presents with  . Left ear pain    Otalgia   There is pain in the left ear. This is a new problem. Episode onset: Started 2 weeks ago. The problem has been waxing and waning. Maximum temperature: pt feels like she has had a fever but has not taken Temperature. Fever duration: Pt has been taken Tylenol every 4 hours for fever, started on Saturday. The pain is at a severity of 5/10. The pain is moderate. Associated symptoms include coughing, ear discharge (green / yellow), headaches, neck pain (Left side) and a sore throat. She has tried acetaminophen (Claritin) for the symptoms. The treatment provided mild relief.   Recently returned home from trip from [place] state, was next to a person for several hours with pink eye and URI symptoms.  Denies CP and SOB.  Past Medical History:  Diagnosis Date  . Acute ST elevation myocardial infarction (STEMI) of inferior wall ([place]) 09/26/2017   Pt presented 09/26/17 with an acute inferior STEMI. Door to balloon time 30 minutes.   . Coronary artery disease   . Heart disease   . Hyperlipidemia   . Sleep apnea 2007   uses c pap on occasion     Social History   Socioeconomic History  . Marital status: Divorced    Spouse name: Not on file  . Number of children: 2  . Years of education: Not on file  . Highest education level: Master's degree (e.g., MA, MS, MEng, MEd, MSW, MBA)  Occupational History  . Occupation: Optometrist  Social Needs  . Financial resource strain: Not hard at all  . Food insecurity:    Worry: Never true    Inability: Not on file  . Transportation needs:    Medical: No    Non-medical: No  Tobacco Use  . Smoking status: Never Smoker  . Smokeless tobacco: Never Used  Substance and Sexual Activity  . Alcohol use: No  . Drug use: No  .  Sexual activity: Not Currently  Lifestyle  . Physical activity:    Days per week: 3 days    Minutes per session: 30 min  . Stress: Not on file  Relationships  . Social connections:    Talks on phone: Not on file    Gets together: Not on file    Attends religious service: Not on file    Active member of club or organization: Not on file    Attends meetings of clubs or organizations: Not on file    Relationship status: Not on file  . Intimate partner violence:    Fear of current or ex partner: Not on file    Emotionally abused: Not on file    Physically abused: Not on file    Forced sexual activity: Not on file  Other Topics Concern  . Not on file  Social History Narrative   Divorced. 2 sons [name] 26 and [name]. No grandkids yet.       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 Graduate school at [employer]: walking, surface design- Financial planner, play mahjong, movies, reading, travel    Past Surgical History:  Procedure Laterality Date  .  CARDIAC CATHETERIZATION    . CORONARY/GRAFT ACUTE MI REVASCULARIZATION N/A 09/26/2017   Procedure: Coronary/Graft Acute MI Revascularization;  Surgeon: Lorretta Harp, MD;  Location: [place] [facility];  Service: Cardiovascular;  Laterality: N/A;  . LEFT HEART CATH AND CORONARY ANGIOGRAPHY N/A 09/26/2017   Procedure: LEFT HEART CATH AND CORONARY ANGIOGRAPHY;  Surgeon: Lorretta Harp, MD;  Location: [place] [facility];  Service: Cardiovascular;  Laterality: N/A;  . nodulectomy     Salzmann Nodular Degeneration  . STENT PLACEMENT VASCULAR ([place] [facility])  2019  . TONSILLECTOMY AND ADENOIDECTOMY  1967  . WISDOM TOOTH EXTRACTION  1981    Family History  Problem Relation Age of Onset  . Colon cancer Father        healthy until 26s then developed  . Atrial fibrillation Father        pacemaker  . Hyperlipidemia Mother    . Other Mother        pacemaker. she is very short stature. healthy until 64s  . Breast cancer Sister        stage 0   . Hashimoto's thyroiditis Sister   . Stroke Son        due to PFO  . Diabetes Maternal Grandfather     Allergies  Allergen Reactions  . Bupropion Hives    Other reaction(s): Unknown  . Cefprozil Hives  . Cefprozil Hives and Other (See Comments)  . Cheese Hives  . Peanut-Containing Drug Products Hives  . Prednisolone     Caused dangerously high eye pressure around time of eye surgery  . Salmon [Fish Allergy] Hives  . Wellbutrin [Bupropion Hcl] Hives    Current Medications:   Current Outpatient Medications:  .  aspirin 81 MG chewable tablet, Chew 1 tablet (81 mg total) by mouth daily., Disp: 90 tablet, Rfl: 3 .  atorvastatin (LIPITOR) 80 MG tablet, Take 1 tablet (80 mg total) by mouth daily at 6 PM., Disp: 90 tablet, Rfl: 3 .  Cholecalciferol (VITAMIN D PO), Take 5,000 Units by mouth daily., Disp: , Rfl:  .  Cranberry 1000 MG CAPS, Take 1 capsule by mouth daily as needed (yeast infection)., Disp: , Rfl:  .  ezetimibe (ZETIA) 10 MG tablet, Take 1 tablet (10 mg total) by mouth daily., Disp: 90 tablet, Rfl: 3 .  nitroGLYCERIN (NITROSTAT) 0.4 MG SL tablet, Place 1 tablet (0.4 mg total) under the tongue every 5 (five) minutes x 3 doses as needed for chest pain., Disp: 25 tablet, Rfl: 3 .  OVER THE COUNTER MEDICATION, Take 500 mg by mouth daily. D-Mannose, Disp: , Rfl:  .  Probiotic Product (PROBIOTIC ADVANCED PO), Take by mouth., Disp: , Rfl:  .  sodium chloride (MURO 128) 5 % ophthalmic solution, Place 1 drop into both eyes 3 (three) times daily. , Disp: , Rfl:  .  ticagrelor (BRILINTA) 90 MG TABS tablet, Take 1 tablet (90 mg total) by mouth 2 (two) times daily., Disp: 180 tablet, Rfl: 3 .  azithromycin (ZITHROMAX) 250 MG tablet, Take two tabs on day 1, then one tab daily x 4 days, Disp: 6 each, Rfl: 0   Review of Systems:   Review of Systems  HENT: Positive for  ear discharge (green / yellow), ear pain and sore throat.   Respiratory: Positive for cough.   Musculoskeletal: Positive for neck pain (Left side).  Neurological: Positive for headaches.    Vitals:   Vitals:   03/24/18 1112  BP: 110/70  Pulse: 88  Temp:  98.6 F (37 C)  TempSrc: Oral  SpO2: 96%  Weight: 154 lb 6.1 oz (70 kg)  Height: 5\' 3"  (1.6 m)     Body mass index is 27.35 kg/m.  Physical Exam:   Physical Exam  Constitutional: She appears well-developed. She is cooperative.  Non-toxic appearance. She does not have a sickly appearance. She does not appear ill. No distress.  HENT:  Head: Normocephalic and atraumatic.  Right Ear: External ear and ear canal normal. Tympanic membrane is not erythematous, not retracted and not bulging. A middle ear effusion is present.  Left Ear: External ear and ear canal normal. Tympanic membrane is not erythematous, not retracted and not bulging. A middle ear effusion is present.  Nose: Mucosal edema and rhinorrhea present. Right sinus exhibits no maxillary sinus tenderness and no frontal sinus tenderness. Left sinus exhibits no maxillary sinus tenderness and no frontal sinus tenderness.  Mouth/Throat: Uvula is midline and mucous membranes are normal. Posterior oropharyngeal erythema present. No posterior oropharyngeal edema. Tonsils are 0 on the right. Tonsils are 0 on the left. No tonsillar exudate.  Moderate amount of clear fluid behind bilateral TM  Eyes: Conjunctivae and lids are normal.  Neck: Trachea normal.  Cardiovascular: Normal rate, regular rhythm, S1 normal, S2 normal and normal heart sounds.  Pulmonary/Chest: Effort normal and breath sounds normal. She has no decreased breath sounds. She has no wheezes. She has no rhonchi. She has no rales.  Lymphadenopathy:    She has cervical adenopathy.  Neurological: She is alert.  Skin: Skin is warm, dry and intact.  Psychiatric: She has a normal mood and affect. Her speech is normal and  behavior is normal.  Nursing note and vitals reviewed.   Assessment and Plan:    Mette was seen today for left ear pain.  Diagnoses and all orders for this visit:  Left ear pain and Laryngitis No red flags on exam.  Given duration of symptoms and exposures during travel, will initiate Azithromycin per orders (has cephalosporin allergy, also has had severe reaction to prednisolone in past with increased eye pressure.) Discussed taking medications as prescribed. Reviewed return precautions including worsening fever, SOB, worsening cough or other concerns. Push fluids and rest. I recommend that patient follow-up if symptoms worsen or persist despite treatment x 7-10 days, sooner if needed.  Other orders -     azithromycin (ZITHROMAX) 250 MG tablet; Take two tabs on day 1, then one tab daily x 4 days  . Reviewed expectations re: course of current medical issues. . Discussed self-management of symptoms. . Outlined signs and symptoms indicating need for more acute intervention. . Patient verbalized understanding and all questions were answered. . See orders for this visit as documented in the electronic medical record. . Patient received an After-Visit Summary.  CMA or LPN served as scribe during this visit. History, Physical, and Plan performed by medical provider. The above documentation has been reviewed and is accurate and complete.   Inda Coke, PA-C

## 2018-03-24 NOTE — Patient Instructions (Signed)
It was great to see you!  Use medication as prescribed: Azithromycin antibiotic  Push fluids and get plenty of rest. Please return if you are not improving as expected, or if you have high fevers (>101.5) or difficulty swallowing or worsening productive cough.  Call clinic with questions.  I hope you start feeling better soon!  

## 2018-03-24 NOTE — Telephone Encounter (Signed)
Received a phone call from Memory Dance with [facility] requesting for me to get patient's HST precerted again. She has taken 2 "bad units" out and they will need to reschedule this again, however the next available appointment is after her valid auth dates.Marland Kitchen [employer] reapproved the HST to change the dates of service.

## 2018-03-25 ENCOUNTER — Ambulatory Visit: Payer: BLUE CROSS/BLUE SHIELD | Admitting: Genetic Counselor

## 2018-03-26 ENCOUNTER — Ambulatory Visit (HOSPITAL_COMMUNITY)
Admission: RE | Admit: 2018-03-26 | Discharge: 2018-03-26 | Disposition: A | Discharge status: Home / Self Care | Payer: BLUE CROSS/BLUE SHIELD | Source: Ambulatory Visit | Attending: Cardiovascular Disease | Admitting: Cardiovascular Disease

## 2018-03-26 ENCOUNTER — Encounter (HOSPITAL_COMMUNITY): Payer: BLUE CROSS/BLUE SHIELD

## 2018-03-26 DIAGNOSIS — R079 Chest pain, unspecified: Secondary | ICD-10-CM | POA: Diagnosis not present

## 2018-03-26 DIAGNOSIS — Z01812 Encounter for preprocedural laboratory examination: Secondary | ICD-10-CM | POA: Diagnosis not present

## 2018-03-26 DIAGNOSIS — I200 Unstable angina: Secondary | ICD-10-CM | POA: Insufficient documentation

## 2018-03-26 DIAGNOSIS — R072 Precordial pain: Secondary | ICD-10-CM | POA: Diagnosis not present

## 2018-03-26 MED ORDER — NITROGLYCERIN 0.4 MG SL SUBL
SUBLINGUAL_TABLET | SUBLINGUAL | Status: AC
  Filled 2018-03-26: qty 2

## 2018-03-26 MED ORDER — NITROGLYCERIN 0.4 MG SL SUBL
0.8000 mg | SUBLINGUAL_TABLET | Freq: Once | SUBLINGUAL | Status: AC
  Administered 2018-03-26: 0.8 mg via SUBLINGUAL
  Filled 2018-03-26: qty 25

## 2018-03-26 MED ORDER — IOPAMIDOL (ISOVUE-370) INJECTION 76%
100.0000 mL | Freq: Once | INTRAVENOUS | Status: AC | PRN
  Administered 2018-03-26: 80 mL via INTRAVENOUS

## 2018-03-26 MED ORDER — METOPROLOL TARTRATE 5 MG/5ML IV SOLN
5.0000 mg | INTRAVENOUS | Status: DC | PRN
  Administered 2018-03-26: 5 mg via INTRAVENOUS
  Filled 2018-03-26: qty 5

## 2018-03-26 MED ORDER — METOPROLOL TARTRATE 5 MG/5ML IV SOLN
INTRAVENOUS | Status: AC
  Filled 2018-03-26: qty 10

## 2018-03-26 NOTE — Progress Notes (Signed)
CT scan completed. Tolerated well. D/C home walking. Awake and alert. In no distress. 

## 2018-03-26 NOTE — Progress Notes (Signed)
CT complete. Patient denies any complaints. BP: 86/64, patient drinking water and having snack with feet elevated. Will continue to monitor.

## 2018-03-29 ENCOUNTER — Encounter (HOSPITAL_COMMUNITY): Payer: BLUE CROSS/BLUE SHIELD

## 2018-03-31 ENCOUNTER — Encounter (HOSPITAL_COMMUNITY): Payer: BLUE CROSS/BLUE SHIELD

## 2018-04-02 ENCOUNTER — Encounter (HOSPITAL_COMMUNITY): Payer: BLUE CROSS/BLUE SHIELD

## 2018-04-02 NOTE — Progress Notes (Signed)
Pre-test GC notes  Cathy Bell was referred for genetic consult of familial hypercholesterolemia ([place]). We walked through the risk factors that can lead to hypercholesterolemia. I explained to her the characteristic features of a genetic condition, namely absence of risk factors, early age of presentation, increased disease severity and family history of the condition. The clinical manifestations of FH were also reviewed.   I discussed the molecular pathogenesis of FH. I informed her that [place] is primarily caused by pathogenic variants in three genes, namely APOB, LDLR and PCSK9. These pathogenic variants impact LDLR synthesis, degradation and recycling in cells leading to elevated LDL-C levels. We then walked through autosomal dominant inheritance pattern and viewed pedigree of families with heterozygous FH (HeFH) and homozygous FH (HoFH). I informed her that digenic or compound heterozygous mutations in APOB, LDLR and PCSK9 genes can cause HoFH.   We reviewed the likely outcomes of FH genetic testing. Based on the diagnostic criteria for FH, yields can range from 50%-90%. A positive yield is observed in  ~63% of patients with a definite clinical diagnosis of FH. I also made clear to her that a negative test does not exclude a genetic basis for FH. Limitations in current genetic testing methodology can produce a negative result. Variants of unknown significance (VUS) can be seen in some cases.  It is also likely that mutation negative patients reflect a polygenic origin where more than an average number of common variants with small effect that raises plasma lipid levels were inherited. I explained to her that typically a VUS is so classified if the variant is not well understood as very few individuals have been reported to harbor this variant or its role in gene function has not been elucidated. Screening other first-degree family members by genetic testing was also discussed. Additionally, we briefly  touched upon the molecular basis of the different treatment modalities that are currently available.  Her medical and 4-generation family history was obtained. See details below-  Cathy Bell (III.3 on pedigree) is a pleasant 57 year old Caucasian lady who works as a Optometrist in [employer]. Cathy Bell states that since her mother has elevated lipids, she has a healthy lifestyle of mostly plant based diet and exercise. Her exercise regimen includes walking 4 miles on weekends, aerobics and weights on Monday and Wednesday, yoga on Tuesday and Friday. She reports going out for a walk on Thursday and could'nt finish it due to breathlessness. The next day she walked the same path with a friend and tells me that around mile 3.5 she felt as if she had swallowed smoke, her throat went dry and felt an immediate need to use the restroom. Her friend called 911 and upon evaluation her EKG showed inferior ST segment elevation with reciprocal anterior depression.  She was taken to the Cath Lab where an occluded mid RCA was opened with a synergy drug-eluting stent.  She was placed on high-dose statin therapy and referred to Dr. Debara Pickett for lipid management. She states that her previous lipid panel screening were borderline high that did not necessitate medication.   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 She denies having renal and thyroid dysfunction. She does not smoke but had gestational diabetes when she was pregnant. She does not have an atherogenic diet and has a healthy lifestyle.  Family history Cathy Bell (III.3) is one on 4 siblings. She has two sons (IV.8-IV.9), ages 15  and 25. She informs me that her 58 year old (IV.8) son suffered a stroke at 66 but was found to have normal lipid levels. Her siblings (III.1, III.2, III.4) and their children  (IV.1-IV.7, IV.10-IV.13) are  mostly healthy and have not had atherosclerotic vascular disease.   [name]'s parents had a pacemaker implanted in their 82s. Her dad (II.7) has Afib and had an abdominal aortic dissection at age 69. She reports that her mother (II.8) has had high cholesterol all her life but has not suffered an adverse event. Her maternal grandparents (I.3, I.4) died of prostate cancer and scarlet fever, respectively.   Cathy Bell's paternal uncles and aunts (II.1-II.6) also reportedly did not/do not have heart disease. Her paternal grandmother (I.2) had angina and underwent open-heart surgery in her 59s. Cathy Bell states that her paternal grandmother died of a heart related condition at 40.   Impression and Plans  In summary, Cathy Bell was diagnosed with hyperlipidemia at age 57 and now has a stent in her mid RCA. She does not have the typical risk factors for hyperlipidemia and lives a healthy lifestyle. Additionally, she does not present with xanthomas or corneal arcus. He mother is noted to have elevated cholesterol but has not suffered an adverse event. In light of her presentation in the absence of risk factors, her hyperlipidemia may have a monogenic etiology or may be polygenic.  Genetic testing will confirm this and if found to be monogenic she can be placed on appropriate medication to lower and stabilize her LDL-C levels. Genetic testing can be recommended and should evaluate the major genes implicated in familial hypercholesterolemia. Cathy Bell verbalized understanding of this.   Blood was drawn today and sent out for genetic testing    Cathy Bell, Ph.D, [facility] Clinical Molecular Geneticist

## 2018-04-05 ENCOUNTER — Encounter (HOSPITAL_COMMUNITY): Payer: BLUE CROSS/BLUE SHIELD

## 2018-04-07 ENCOUNTER — Encounter (HOSPITAL_COMMUNITY): Payer: BLUE CROSS/BLUE SHIELD

## 2018-04-09 ENCOUNTER — Encounter (HOSPITAL_COMMUNITY): Payer: BLUE CROSS/BLUE SHIELD

## 2018-04-12 ENCOUNTER — Encounter (HOSPITAL_COMMUNITY): Payer: BLUE CROSS/BLUE SHIELD

## 2018-04-12 ENCOUNTER — Telehealth: Payer: Self-pay | Admitting: Cardiovascular Disease

## 2018-04-12 NOTE — Telephone Encounter (Signed)
No note needed 

## 2018-04-14 ENCOUNTER — Encounter (HOSPITAL_COMMUNITY): Payer: BLUE CROSS/BLUE SHIELD

## 2018-04-16 ENCOUNTER — Encounter (HOSPITAL_COMMUNITY): Payer: BLUE CROSS/BLUE SHIELD

## 2018-04-19 ENCOUNTER — Ambulatory Visit (HOSPITAL_BASED_OUTPATIENT_CLINIC_OR_DEPARTMENT_OTHER): Payer: BLUE CROSS/BLUE SHIELD | Attending: Adult Health | Admitting: Cardiovascular Disease

## 2018-04-19 ENCOUNTER — Telehealth: Payer: Self-pay | Admitting: Internal Medicine

## 2018-04-19 ENCOUNTER — Encounter (HOSPITAL_COMMUNITY): Payer: BLUE CROSS/BLUE SHIELD

## 2018-04-19 VITALS — Ht 63.0 in | Wt 160.0 lb

## 2018-04-19 DIAGNOSIS — Z79899 Other long term (current) drug therapy: Secondary | ICD-10-CM | POA: Insufficient documentation

## 2018-04-19 DIAGNOSIS — Z7982 Long term (current) use of aspirin: Secondary | ICD-10-CM | POA: Insufficient documentation

## 2018-04-19 DIAGNOSIS — G4733 Obstructive sleep apnea (adult) (pediatric): Secondary | ICD-10-CM | POA: Diagnosis not present

## 2018-04-19 NOTE — Telephone Encounter (Signed)
Will forward to Dr [name].

## 2018-04-19 NOTE — Telephone Encounter (Signed)
New Message:      Lattie Haw with [employer] is calling for a response of a e-mail that was sent over 2 weeks ago.

## 2018-04-20 ENCOUNTER — Ambulatory Visit (INDEPENDENT_AMBULATORY_CARE_PROVIDER_SITE_OTHER): Payer: BLUE CROSS/BLUE SHIELD | Admitting: Cardiovascular Disease

## 2018-04-20 ENCOUNTER — Encounter: Payer: Self-pay | Admitting: Cardiovascular Disease

## 2018-04-20 VITALS — BP 116/68 | HR 80 | Ht 62.5 in | Wt 160.0 lb

## 2018-04-20 DIAGNOSIS — I2510 Atherosclerotic heart disease of native coronary artery without angina pectoris: Secondary | ICD-10-CM

## 2018-04-20 DIAGNOSIS — Z9861 Coronary angioplasty status: Secondary | ICD-10-CM

## 2018-04-20 DIAGNOSIS — E7849 Other hyperlipidemia: Secondary | ICD-10-CM | POA: Diagnosis not present

## 2018-04-20 DIAGNOSIS — Z01812 Encounter for preprocedural laboratory examination: Secondary | ICD-10-CM | POA: Diagnosis not present

## 2018-04-20 NOTE — Patient Instructions (Signed)
Medication Instructions:  Your physician recommends that you continue on your current medications as directed. Please refer to the Current Medication list given to you today.  If you need a refill on your cardiac medications before your next appointment, please call your pharmacy.   Lab work: Your physician recommends that you return for lab work in: TODAY  If you have labs (blood work) drawn today and your tests are completely normal, you will receive your results only by: Marland Kitchen MyChart Message (if you have MyChart) OR . A paper copy in the mail If you have any lab test that is abnormal or we need to change your treatment, we will call you to review the results.  Testing/Procedures: Your physician has requested that you have a cardiac catheterization. Cardiac catheterization is used to diagnose and/or treat various heart conditions. Doctors may recommend this procedure for a number of different reasons. The most common reason is to evaluate chest pain. Chest pain can be a symptom of coronary artery disease (CAD), and cardiac catheterization can show whether plaque is narrowing or blocking your heart's arteries. This procedure is also used to evaluate the valves, as well as measure the blood flow and oxygen levels in different parts of your heart. For further information please visit [email or web address]. Please follow instruction sheet, as given.    Follow-Up: At [facility], you and your health needs are our priority.  As part of our continuing mission to provide you with exceptional heart care, we have created designated Provider Care Teams.  These Care Teams include your primary Cardiologist (physician) and Advanced Practice Providers (APPs -  Physician Assistants and Nurse Practitioners) who all work together to provide you with the care you need, when you need it. You will need a follow up appointment in 2 weeks FROM 04/22/2018.  Please call our office 2 months in advance to schedule this  appointment.  You may see Quay Burow, MD or one of the following Advanced Practice Providers on your designated Care Team:   Kerin Ransom, PA-C Roby Lofts, [place] . Sande Rives, PA-C  Any Other Special Instructions Will Be Listed Below (If Applicable).

## 2018-04-20 NOTE — Assessment & Plan Note (Signed)
History of CAD status post inferior STEMI 09/26/2017 with implantation of a synergy drug-eluting stent in the mid RCA.  She did have an 80% mid LAD lesion which I elected to treat medically.  Subsequent Myoview stress test did not show ischemia in that territory because of increasing fatigue as well as chest pain coronary CTA was performed that did show a moderate mid LAD lesion but FFR unfortunately could not be performed.  Because of her ongoing symptoms I decided to proceed with elective outpatient radial LAD intervention. The patient understands that risks included but are not limited to stroke (1 in 1000), death (1 in 51), kidney failure [usually temporary] (1 in 500), bleeding (1 in 200), allergic reaction [possibly serious] (1 in 200). The patient understands and agrees to proceed

## 2018-04-20 NOTE — Assessment & Plan Note (Signed)
History of hyperlipidemia on high-dose atorvastatin with recent lipid profile performed 01/28/2018 revealed reduction in LDL from 176 back in March of this year down to 57.

## 2018-04-20 NOTE — H&P (View-Only) (Signed)
04/20/2018 Cathy Bell   1960-12-15  [id number]  Primary Physician Yong Channel Brayton Mars, MD Primary Cardiologist: Lorretta Harp MD FACP, [place], [place], [place]  HPI:  Cathy Bell is a 57 y.o.   moderately overweight divorced Caucasian female who I last saw in the office 02/10/2018.  She presented on 09/26/2017 with chest pain while walking [place] the park. EKG showed inferior ST segment elevation with reciprocal anterior depression. I brought her to the Cath Lab on Saturday morning and opened up an occluded mid RCA with a synergy drug-eluting stent. She did have inferobasal hypokinesia which improved by 2D echo the following day. She did have an 80% mid LAD lesion with a subsequent negative Myoview stress test. Her major complaints are of fatigue. Her other problems include hyperlipidemia on high-dose statin therapy with mildly elevated liver function tests. She is on dual antiplatelet therapy with Brilinta. Since I saw her 2 months ago she had progressive chest pressure especially when walking up an incline.  A recent coronary CTA performed 03/26/2018 confirmed a moderate mid LAD lesion but unfortunately FFR could not be performed.  Recently progressive symptoms of decided to proceed with elective outpatient LAD intervention.   Current Meds  Medication Sig  . aspirin 81 MG chewable tablet Chew 1 tablet (81 mg total) by mouth daily.  Marland Kitchen atorvastatin (LIPITOR) 80 MG tablet Take 1 tablet (80 mg total) by mouth daily at 6 PM.  . azithromycin (ZITHROMAX) 250 MG tablet Take two tabs on day 1, then one tab daily x 4 days  . Cholecalciferol (VITAMIN D PO) Take 5,000 Units by mouth daily.  . Cranberry 1000 MG CAPS Take 1 capsule by mouth daily as needed (yeast infection).  . ezetimibe (ZETIA) 10 MG tablet Take 1 tablet (10 mg total) by mouth daily.  . nitroGLYCERIN (NITROSTAT) 0.4 MG SL tablet Place 1 tablet (0.4 mg total) under the tongue every 5 (five) minutes x 3 doses as needed for chest  pain.  Marland Kitchen OVER THE COUNTER MEDICATION Take 500 mg by mouth daily. D-Mannose  . Probiotic Product (PROBIOTIC ADVANCED PO) Take by mouth.  . sodium chloride (MURO 128) 5 % ophthalmic solution Place 1 drop into both eyes 3 (three) times daily.   . ticagrelor (BRILINTA) 90 MG TABS tablet Take 1 tablet (90 mg total) by mouth 2 (two) times daily.     Allergies  Allergen Reactions  . Bupropion Hives    Other reaction(s): Unknown  . Cefprozil Hives  . Cefprozil Hives and Other (See Comments)  . Cheese Hives  . Peanut-Containing Drug Products Hives  . Prednisolone     Caused dangerously high eye pressure around time of eye surgery  . Salmon [Fish Allergy] Hives  . Wellbutrin [Bupropion Hcl] Hives    Social History   Socioeconomic History  . Marital status: Divorced    Spouse name: Not on file  . Number of children: 2  . Years of education: Not on file  . Highest education level: Master's degree (e.g., MA, MS, MEng, MEd, MSW, MBA)  Occupational History  . Occupation: Optometrist  Social Needs  . Financial resource strain: Not hard at all  . Food insecurity:    Worry: Never true    Inability: Not on file  . Transportation needs:    Medical: No    Non-medical: No  Tobacco Use  . Smoking status: Never Smoker  . Smokeless tobacco: Never Used  Substance and Sexual Activity  . Alcohol use:  No  . Drug use: No  . Sexual activity: Not Currently  Lifestyle  . Physical activity:    Days per week: 3 days    Minutes per session: 30 min  . Stress: Not on file  Relationships  . Social connections:    Talks on phone: Not on file    Gets together: Not on file    Attends religious service: Not on file    Active member of club or organization: Not on file    Attends meetings of clubs or organizations: Not on file    Relationship status: Not on file  . Intimate partner violence:    Fear of current or ex partner: Not on file    Emotionally abused: Not on file    Physically abused: Not on  file    Forced sexual activity: Not on file  Other Topics Concern  . Not on file  Social History Narrative   Divorced. 2 sons Cathy Bell 26 and Cathy Bell. No grandkids yet.       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  Cardiovascular: negative for chest pain, dyspnea on exertion, edema, orthopnea, palpitations, paroxysmal nocturnal dyspnea or shortness of breath Dermatological: negative for rash Respiratory: negative for cough or wheezing Urologic: negative for hematuria Abdominal: negative for nausea, vomiting, diarrhea, bright red blood per rectum, melena, or hematemesis Neurologic: negative for visual changes, syncope, or dizziness All other systems reviewed and are otherwise negative except as noted above.    Blood pressure 116/68, pulse 80, height 5' 2.5" (1.588 m), weight 160 lb (72.6 kg), last menstrual period 03/18/2011, SpO2 98 %.  General appearance: alert and no distress Neck: no adenopathy, no carotid bruit, no JVD, supple, symmetrical, trachea midline and thyroid not enlarged, symmetric, no tenderness/mass/nodules Lungs: clear to auscultation bilaterally Heart: regular rate and rhythm, S1, S2 normal, no murmur, click, rub or gallop Extremities: extremities normal, atraumatic, no cyanosis or edema Pulses: 2+ and symmetric Skin: Skin color, texture, turgor normal. No rashes or lesions Neurologic: Alert and oriented X 3, normal strength and tone. Normal symmetric reflexes. Normal coordination and gait  EKG not performed today  ASSESSMENT AND PLAN:   CAD S/P percutaneous coronary angioplasty History of CAD status post inferior STEMI 09/26/2017 with  implantation of a synergy drug-eluting stent in the mid RCA.  She did have an 80% mid LAD lesion which I elected to treat medically.  Subsequent Myoview stress test did not show ischemia in that territory because of increasing fatigue as well as chest pain coronary CTA was performed that did show a moderate mid LAD lesion but FFR unfortunately could not be performed.  Because of her ongoing symptoms I decided to proceed with elective outpatient radial LAD intervention. The patient understands that risks included but are not limited to stroke (1 in 1000), death (1 in 41), kidney failure [usually temporary] (1 in 500), bleeding (1 in 200), allergic reaction [possibly serious] (1 in 200). The patient understands and agrees to proceed  Familial hyperlipidemia History of hyperlipidemia on high-dose atorvastatin with recent lipid profile performed 01/28/2018 revealed reduction in LDL from 176 back in March of this year down to 57.  Lorretta Harp MD FACP,FACC,FAHA, [facility], [place] 04/20/2018 3:04 PM

## 2018-04-20 NOTE — Progress Notes (Signed)
04/20/2018 Cathy Bell   [birth year]  [id number]  Primary Physician Yong Channel Brayton Mars, MD Primary Cardiologist: Lorretta Harp MD FACP, [place], [place], [place]  HPI:  Cathy Bell is a 57 y.o.   moderately overweight divorced Caucasian female who I last saw in the office 02/10/2018.  She presented on 09/26/2017 with chest pain while walking in the park. EKG showed inferior ST segment elevation with reciprocal anterior depression. I brought her to the Cath Lab on Saturday morning and opened up an occluded mid RCA with a synergy drug-eluting stent. She did have inferobasal hypokinesia which improved by 2D echo the following day. She did have an 80% mid LAD lesion with a subsequent negative Myoview stress test. Her major complaints are of fatigue. Her other problems include hyperlipidemia on high-dose statin therapy with mildly elevated liver function tests. She is on dual antiplatelet therapy with Brilinta. Since I saw her 2 months ago she had progressive chest pressure especially when walking up an incline.  A recent coronary CTA performed 03/26/2018 confirmed a moderate mid LAD lesion but unfortunately FFR could not be performed.  Recently progressive symptoms of decided to proceed with elective outpatient LAD intervention.   Current Meds  Medication Sig  . aspirin 81 MG chewable tablet Chew 1 tablet (81 mg total) by mouth daily.  Marland Kitchen atorvastatin (LIPITOR) 80 MG tablet Take 1 tablet (80 mg total) by mouth daily at 6 PM.  . azithromycin (ZITHROMAX) 250 MG tablet Take two tabs on day 1, then one tab daily x 4 days  . Cholecalciferol (VITAMIN D PO) Take 5,000 Units by mouth daily.  . Cranberry 1000 MG CAPS Take 1 capsule by mouth daily as needed (yeast infection).  . ezetimibe (ZETIA) 10 MG tablet Take 1 tablet (10 mg total) by mouth daily.  . nitroGLYCERIN (NITROSTAT) 0.4 MG SL tablet Place 1 tablet (0.4 mg total) under the tongue every 5 (five) minutes x 3 doses as needed for chest  pain.  Marland Kitchen OVER THE COUNTER MEDICATION Take 500 mg by mouth daily. D-Mannose  . Probiotic Product (PROBIOTIC ADVANCED PO) Take by mouth.  . sodium chloride (MURO 128) 5 % ophthalmic solution Place 1 drop into both eyes 3 (three) times daily.   . ticagrelor (BRILINTA) 90 MG TABS tablet Take 1 tablet (90 mg total) by mouth 2 (two) times daily.     Allergies  Allergen Reactions  . Bupropion Hives    Other reaction(s): Unknown  . Cefprozil Hives  . Cefprozil Hives and Other (See Comments)  . Cheese Hives  . Peanut-Containing Drug Products Hives  . Prednisolone     Caused dangerously high eye pressure around time of eye surgery  . Salmon [Fish Allergy] Hives  . Wellbutrin [Bupropion Hcl] Hives    Social History   Socioeconomic History  . Marital status: Divorced    Spouse name: Not on file  . Number of children: 2  . Years of education: Not on file  . Highest education level: Master's degree (e.g., MA, MS, MEng, MEd, MSW, MBA)  Occupational History  . Occupation: Optometrist  Social Needs  . Financial resource strain: Not hard at all  . Food insecurity:    Worry: Never true    Inability: Not on file  . Transportation needs:    Medical: No    Non-medical: No  Tobacco Use  . Smoking status: Never Smoker  . Smokeless tobacco: Never Used  Substance and Sexual Activity  . Alcohol use:  No  . Drug use: No  . Sexual activity: Not Currently  Lifestyle  . Physical activity:    Days per week: 3 days    Minutes per session: 30 min  . Stress: Not on file  Relationships  . Social connections:    Talks on phone: Not on file    Gets together: Not on file    Attends religious service: Not on file    Active member of club or organization: Not on file    Attends meetings of clubs or organizations: Not on file    Relationship status: Not on file  . Intimate partner violence:    Fear of current or ex partner: Not on file    Emotionally abused: Not on file    Physically abused: Not on  file    Forced sexual activity: Not on file  Other Topics Concern  . Not on file  Social History Narrative   Divorced. 2 sons Cathy Bell 26 and Cathy Bell. No grandkids yet.       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  Cardiovascular: negative for chest pain, dyspnea on exertion, edema, orthopnea, palpitations, paroxysmal nocturnal dyspnea or shortness of breath Dermatological: negative for rash Respiratory: negative for cough or wheezing Urologic: negative for hematuria Abdominal: negative for nausea, vomiting, diarrhea, bright red blood per rectum, melena, or hematemesis Neurologic: negative for visual changes, syncope, or dizziness All other systems reviewed and are otherwise negative except as noted above.    Blood pressure 116/68, pulse 80, height 5' 2.5" (1.588 m), weight 160 lb (72.6 kg), last menstrual period 03/18/2011, SpO2 98 %.  General appearance: alert and no distress Neck: no adenopathy, no carotid bruit, no JVD, supple, symmetrical, trachea midline and thyroid not enlarged, symmetric, no tenderness/mass/nodules Lungs: clear to auscultation bilaterally Heart: regular rate and rhythm, S1, S2 normal, no murmur, click, rub or gallop Extremities: extremities normal, atraumatic, no cyanosis or edema Pulses: 2+ and symmetric Skin: Skin color, texture, turgor normal. No rashes or lesions Neurologic: Alert and oriented X 3, normal strength and tone. Normal symmetric reflexes. Normal coordination and gait  EKG not performed today  ASSESSMENT AND PLAN:   CAD S/P percutaneous coronary angioplasty History of CAD status post inferior STEMI 09/26/2017 with  implantation of a synergy drug-eluting stent in the mid RCA.  She did have an 80% mid LAD lesion which I elected to treat medically.  Subsequent Myoview stress test did not show ischemia in that territory because of increasing fatigue as well as chest pain coronary CTA was performed that did show a moderate mid LAD lesion but FFR unfortunately could not be performed.  Because of her ongoing symptoms I decided to proceed with elective outpatient radial LAD intervention. The patient understands that risks included but are not limited to stroke (1 in 1000), death (1 in 80), kidney failure [usually temporary] (1 in 500), bleeding (1 in 200), allergic reaction [possibly serious] (1 in 200). The patient understands and agrees to proceed  Familial hyperlipidemia History of hyperlipidemia on high-dose atorvastatin with recent lipid profile performed 01/28/2018 revealed reduction in LDL from 176 back in March of this year down to 57.  Lorretta Harp MD FACP,FACC,FAHA, [facility] 04/20/2018 3:04 PM

## 2018-04-21 ENCOUNTER — Encounter (HOSPITAL_COMMUNITY): Payer: BLUE CROSS/BLUE SHIELD

## 2018-04-21 LAB — BASIC METABOLIC PANEL
BUN/Creatinine Ratio: 22 (ref 9–23)
BUN: 14 mg/dL (ref 6–24)
CO2: 25 mmol/L (ref 20–29)
CREATININE: 0.63 mg/dL (ref 0.57–1.00)
Calcium: 9.9 mg/dL (ref 8.7–10.2)
Chloride: 99 mmol/L (ref 96–106)
GFR, EST AFRICAN AMERICAN: 114 mL/min/{1.73_m2} (ref >59)
GFR, EST NON AFRICAN AMERICAN: 99 mL/min/{1.73_m2} (ref >59)
Glucose: 85 mg/dL (ref 65–99)
Potassium: 4.6 mmol/L (ref 3.5–5.2)
Sodium: 141 mmol/L (ref 134–144)

## 2018-04-21 LAB — CBC WITH DIFFERENTIAL/PLATELET
BASOS: 0 %
Basophils Absolute: 0 10*3/uL (ref 0.0–0.2)
EOS (ABSOLUTE): 0.1 10*3/uL (ref 0.0–0.4)
EOS: 1 %
HEMATOCRIT: 39.1 % (ref 34.0–46.6)
Hemoglobin: 12.9 g/dL (ref 11.1–15.9)
IMMATURE GRANS (ABS): 0 10*3/uL (ref 0.0–0.1)
IMMATURE GRANULOCYTES: 0 %
Lymphocytes Absolute: 2.2 10*3/uL (ref 0.7–3.1)
Lymphs: 24 %
MCH: 27.8 pg (ref 26.6–33.0)
MCHC: 33 g/dL (ref 31.5–35.7)
MCV: 84 fL (ref 79–97)
MONOS ABS: 0.8 10*3/uL (ref 0.1–0.9)
Monocytes: 9 %
NEUTROS ABS: 6 10*3/uL (ref 1.4–7.0)
NEUTROS PCT: 66 %
PLATELETS: 193 10*3/uL (ref 150–450)
RBC: 4.64 x10E6/uL (ref 3.77–5.28)
RDW: 13 % (ref 12.3–15.4)
WBC: 9.2 10*3/uL (ref 3.4–10.8)

## 2018-04-21 LAB — TSH: TSH: 1.85 u[IU]/mL (ref 0.450–4.500)

## 2018-04-22 ENCOUNTER — Encounter (HOSPITAL_COMMUNITY)
Admission: EM | Disposition: A | Discharge status: Home / Self Care | Payer: Self-pay | Source: Home / Self Care | Attending: Emergency Medicine

## 2018-04-22 ENCOUNTER — Encounter (HOSPITAL_COMMUNITY): Payer: Self-pay | Admitting: Emergency Medicine

## 2018-04-22 ENCOUNTER — Other Ambulatory Visit: Payer: Self-pay

## 2018-04-22 ENCOUNTER — Ambulatory Visit (HOSPITAL_COMMUNITY)
Admission: EM | Admit: 2018-04-22 | Discharge: 2018-04-23 | Disposition: A | Discharge status: Home / Self Care | Payer: BLUE CROSS/BLUE SHIELD | Attending: Cardiovascular Disease | Admitting: Cardiovascular Disease

## 2018-04-22 ENCOUNTER — Emergency Department (HOSPITAL_COMMUNITY): Payer: BLUE CROSS/BLUE SHIELD

## 2018-04-22 ENCOUNTER — Ambulatory Visit (HOSPITAL_COMMUNITY)
Admission: RE | Admit: 2018-04-22 | Payer: BLUE CROSS/BLUE SHIELD | Source: Ambulatory Visit | Admitting: Cardiovascular Disease

## 2018-04-22 DIAGNOSIS — R079 Chest pain, unspecified: Secondary | ICD-10-CM | POA: Diagnosis not present

## 2018-04-22 DIAGNOSIS — Z6828 Body mass index (BMI) 28.0-28.9, adult: Secondary | ICD-10-CM | POA: Diagnosis not present

## 2018-04-22 DIAGNOSIS — Z23 Encounter for immunization: Secondary | ICD-10-CM | POA: Diagnosis not present

## 2018-04-22 DIAGNOSIS — Z91018 Allergy to other foods: Secondary | ICD-10-CM | POA: Diagnosis not present

## 2018-04-22 DIAGNOSIS — E785 Hyperlipidemia, unspecified: Secondary | ICD-10-CM | POA: Diagnosis not present

## 2018-04-22 DIAGNOSIS — I252 Old myocardial infarction: Secondary | ICD-10-CM | POA: Diagnosis not present

## 2018-04-22 DIAGNOSIS — Z79899 Other long term (current) drug therapy: Secondary | ICD-10-CM | POA: Diagnosis not present

## 2018-04-22 DIAGNOSIS — E7849 Other hyperlipidemia: Secondary | ICD-10-CM

## 2018-04-22 DIAGNOSIS — Z7982 Long term (current) use of aspirin: Secondary | ICD-10-CM | POA: Diagnosis not present

## 2018-04-22 DIAGNOSIS — I213 ST elevation (STEMI) myocardial infarction of unspecified site: Secondary | ICD-10-CM | POA: Diagnosis not present

## 2018-04-22 DIAGNOSIS — E663 Overweight: Secondary | ICD-10-CM | POA: Diagnosis not present

## 2018-04-22 DIAGNOSIS — I10 Essential (primary) hypertension: Secondary | ICD-10-CM | POA: Insufficient documentation

## 2018-04-22 DIAGNOSIS — Z955 Presence of coronary angioplasty implant and graft: Secondary | ICD-10-CM | POA: Diagnosis not present

## 2018-04-22 DIAGNOSIS — I2510 Atherosclerotic heart disease of native coronary artery without angina pectoris: Secondary | ICD-10-CM | POA: Diagnosis present

## 2018-04-22 DIAGNOSIS — Z91013 Allergy to seafood: Secondary | ICD-10-CM | POA: Insufficient documentation

## 2018-04-22 DIAGNOSIS — R0602 Shortness of breath: Secondary | ICD-10-CM | POA: Diagnosis not present

## 2018-04-22 DIAGNOSIS — I25110 Atherosclerotic heart disease of native coronary artery with unstable angina pectoris: Secondary | ICD-10-CM | POA: Insufficient documentation

## 2018-04-22 DIAGNOSIS — Z888 Allergy status to other drugs, medicaments and biological substances status: Secondary | ICD-10-CM | POA: Diagnosis not present

## 2018-04-22 DIAGNOSIS — R0789 Other chest pain: Secondary | ICD-10-CM | POA: Diagnosis not present

## 2018-04-22 DIAGNOSIS — Z9861 Coronary angioplasty status: Secondary | ICD-10-CM

## 2018-04-22 DIAGNOSIS — I200 Unstable angina: Secondary | ICD-10-CM

## 2018-04-22 DIAGNOSIS — Z01812 Encounter for preprocedural laboratory examination: Secondary | ICD-10-CM

## 2018-04-22 DIAGNOSIS — R072 Precordial pain: Secondary | ICD-10-CM | POA: Diagnosis not present

## 2018-04-22 HISTORY — PX: CORONARY STENT INTERVENTION: CATH118234

## 2018-04-22 HISTORY — DX: Personal history of other infectious and parasitic diseases: Z86.19

## 2018-04-22 HISTORY — PX: CORONARY ANGIOPLASTY WITH STENT PLACEMENT: SHX49

## 2018-04-22 LAB — CBC WITH DIFFERENTIAL/PLATELET
ABS IMMATURE GRANULOCYTES: 0.03 10*3/uL (ref 0.00–0.07)
Basophils Absolute: 0 10*3/uL (ref 0.0–0.1)
Basophils Relative: 0 %
Eosinophils Absolute: 0.1 10*3/uL (ref 0.0–0.5)
Eosinophils Relative: 0 %
HEMATOCRIT: 37 % (ref 36.0–46.0)
HEMOGLOBIN: 11.9 g/dL — AB (ref 12.0–15.0)
IMMATURE GRANULOCYTES: 0 %
LYMPHS ABS: 1 10*3/uL (ref 0.7–4.0)
LYMPHS PCT: 8 %
MCH: 28.2 pg (ref 26.0–34.0)
MCHC: 32.2 g/dL (ref 30.0–36.0)
MCV: 87.7 fL (ref 80.0–100.0)
Monocytes Absolute: 0.9 10*3/uL (ref 0.1–1.0)
Monocytes Relative: 8 %
NEUTROS ABS: 9.5 10*3/uL — AB (ref 1.7–7.7)
NEUTROS PCT: 84 %
Platelets: 163 10*3/uL (ref 150–400)
RBC: 4.22 MIL/uL (ref 3.87–5.11)
RDW: 13.8 % (ref 11.5–15.5)
WBC: 11.4 10*3/uL — ABNORMAL HIGH (ref 4.0–10.5)
nRBC: 0 % (ref 0.0–0.2)

## 2018-04-22 LAB — BASIC METABOLIC PANEL
ANION GAP: 8 (ref 5–15)
BUN: 18 mg/dL (ref 6–20)
CO2: 24 mmol/L (ref 22–32)
Calcium: 8.8 mg/dL — ABNORMAL LOW (ref 8.9–10.3)
Chloride: 106 mmol/L (ref 98–111)
Creatinine, Ser: 0.61 mg/dL (ref 0.44–1.00)
GFR calc non Af Amer: >60 mL/min (ref >60)
GLUCOSE: 133 mg/dL — AB (ref 70–99)
POTASSIUM: 3.8 mmol/L (ref 3.5–5.1)
Sodium: 138 mmol/L (ref 135–145)

## 2018-04-22 LAB — I-STAT TROPONIN, ED: Troponin i, poc: 0 ng/mL (ref 0.00–0.08)

## 2018-04-22 LAB — POCT ACTIVATED CLOTTING TIME: Activated Clotting Time: 422 seconds

## 2018-04-22 SURGERY — CORONARY STENT INTERVENTION
Anesthesia: LOCAL

## 2018-04-22 MED ORDER — LABETALOL HCL 5 MG/ML IV SOLN: 10.0000 mg | INTRAVENOUS | Status: AC | PRN

## 2018-04-22 MED ORDER — TICAGRELOR 90 MG PO TABS: 90.0000 mg | ORAL_TABLET | Freq: Two times a day (BID) | ORAL | Status: DC

## 2018-04-22 MED ORDER — SODIUM CHLORIDE 0.9 % WEIGHT BASED INFUSION
1.0000 mL/kg/h | INTRAVENOUS | Status: DC
  Administered 2018-04-22: 1 mL/kg/h via INTRAVENOUS

## 2018-04-22 MED ORDER — ACETAMINOPHEN 500 MG PO TABS
ORAL_TABLET | ORAL | Status: AC
  Filled 2018-04-22: qty 1

## 2018-04-22 MED ORDER — LIDOCAINE HCL (PF) 1 % IJ SOLN
INTRAMUSCULAR | Status: AC
  Filled 2018-04-22: qty 30

## 2018-04-22 MED ORDER — FENTANYL CITRATE (PF) 100 MCG/2ML IJ SOLN
INTRAMUSCULAR | Status: AC
  Filled 2018-04-22: qty 2

## 2018-04-22 MED ORDER — ACETAMINOPHEN 325 MG PO TABS
ORAL_TABLET | ORAL | Status: AC
  Filled 2018-04-22: qty 2

## 2018-04-22 MED ORDER — SODIUM CHLORIDE 0.9% FLUSH: 3.0000 mL | INTRAVENOUS | Status: DC | PRN

## 2018-04-22 MED ORDER — ACETAMINOPHEN 325 MG PO TABS
650.0000 mg | ORAL_TABLET | ORAL | Status: DC | PRN
  Administered 2018-04-22: 650 mg via ORAL

## 2018-04-22 MED ORDER — ATORVASTATIN CALCIUM 80 MG PO TABS
80.0000 mg | ORAL_TABLET | Freq: Every day | ORAL | Status: DC
  Administered 2018-04-22: 80 mg via ORAL
  Filled 2018-04-22: qty 1

## 2018-04-22 MED ORDER — TICAGRELOR 90 MG PO TABS
90.0000 mg | ORAL_TABLET | Freq: Once | ORAL | Status: DC
  Filled 2018-04-22: qty 1

## 2018-04-22 MED ORDER — SODIUM CHLORIDE 0.9% FLUSH
3.0000 mL | Freq: Two times a day (BID) | INTRAVENOUS | Status: DC
  Administered 2018-04-22: 17:00:00 3 mL via INTRAVENOUS

## 2018-04-22 MED ORDER — NITROGLYCERIN 0.4 MG SL SUBL
SUBLINGUAL_TABLET | SUBLINGUAL | Status: AC
  Filled 2018-04-22: qty 1

## 2018-04-22 MED ORDER — ASPIRIN 81 MG PO CHEW
81.0000 mg | CHEWABLE_TABLET | Freq: Every day | ORAL | Status: DC
  Administered 2018-04-23: 81 mg via ORAL
  Filled 2018-04-22 (×2): qty 1

## 2018-04-22 MED ORDER — HEPARIN (PORCINE) IN NACL 1000-0.9 UT/500ML-% IV SOLN
INTRAVENOUS | Status: AC
  Filled 2018-04-22: qty 1000

## 2018-04-22 MED ORDER — FENTANYL CITRATE (PF) 100 MCG/2ML IJ SOLN
INTRAMUSCULAR | Status: DC | PRN
  Administered 2018-04-22: 25 ug via INTRAVENOUS

## 2018-04-22 MED ORDER — FENTANYL CITRATE (PF) 100 MCG/2ML IJ SOLN
INTRAMUSCULAR | Status: DC | PRN
  Administered 2018-04-22 (×2): 25 ug via INTRAVENOUS

## 2018-04-22 MED ORDER — ANGIOPLASTY BOOK
Freq: Once | Status: AC
  Administered 2018-04-23: 05:00:00 1
  Filled 2018-04-22: qty 1

## 2018-04-22 MED ORDER — SODIUM CHLORIDE 0.9 % IV SOLN: INTRAVENOUS | Status: AC

## 2018-04-22 MED ORDER — BIVALIRUDIN TRIFLUOROACETATE 250 MG IV SOLR
INTRAVENOUS | Status: AC
  Filled 2018-04-22: qty 250

## 2018-04-22 MED ORDER — EZETIMIBE 10 MG PO TABS
10.0000 mg | ORAL_TABLET | Freq: Every day | ORAL | Status: DC
  Administered 2018-04-22: 17:00:00 10 mg via ORAL
  Filled 2018-04-22 (×2): qty 1

## 2018-04-22 MED ORDER — HYDRALAZINE HCL 20 MG/ML IJ SOLN: 5.0000 mg | INTRAMUSCULAR | Status: AC | PRN

## 2018-04-22 MED ORDER — BIVALIRUDIN BOLUS VIA INFUSION - CUPID
INTRAVENOUS | Status: DC | PRN
  Administered 2018-04-22: 54.45 mg via INTRAVENOUS

## 2018-04-22 MED ORDER — MORPHINE SULFATE (PF) 2 MG/ML IV SOLN: 2.0000 mg | INTRAVENOUS | Status: DC | PRN

## 2018-04-22 MED ORDER — SODIUM CHLORIDE 0.9 % IV SOLN
INTRAVENOUS | Status: DC | PRN
  Administered 2018-04-22: 1.75 mg/kg/h via INTRAVENOUS

## 2018-04-22 MED ORDER — NITROGLYCERIN 0.4 MG SL SUBL
SUBLINGUAL_TABLET | SUBLINGUAL | Status: DC | PRN
  Administered 2018-04-22: .4 mg via SUBLINGUAL

## 2018-04-22 MED ORDER — SODIUM CHLORIDE 0.9 % IV SOLN: 250.0000 mL | INTRAVENOUS | Status: DC | PRN

## 2018-04-22 MED ORDER — NITROGLYCERIN 1 MG/10 ML FOR IR/CATH LAB
INTRA_ARTERIAL | Status: AC
  Filled 2018-04-22: qty 10

## 2018-04-22 MED ORDER — SODIUM CHLORIDE 0.9 % WEIGHT BASED INFUSION
3.0000 mL/kg/h | INTRAVENOUS | Status: DC
  Administered 2018-04-22: 3 mL/kg/h via INTRAVENOUS

## 2018-04-22 MED ORDER — LIDOCAINE HCL (PF) 1 % IJ SOLN
INTRAMUSCULAR | Status: DC | PRN
  Administered 2018-04-22: 15 mL

## 2018-04-22 MED ORDER — ASPIRIN 81 MG PO CHEW: 81.0000 mg | CHEWABLE_TABLET | ORAL | Status: DC

## 2018-04-22 MED ORDER — MIDAZOLAM HCL 2 MG/2ML IJ SOLN
INTRAMUSCULAR | Status: DC | PRN
  Administered 2018-04-22: 1 mg

## 2018-04-22 MED ORDER — IOHEXOL 350 MG/ML SOLN
INTRAVENOUS | Status: DC | PRN
  Administered 2018-04-22: 150 mL via INTRAVENOUS

## 2018-04-22 MED ORDER — MIDAZOLAM HCL 2 MG/2ML IJ SOLN
INTRAMUSCULAR | Status: AC
  Filled 2018-04-22: qty 2

## 2018-04-22 MED ORDER — SODIUM CHLORIDE 0.9% FLUSH: 3.0000 mL | Freq: Two times a day (BID) | INTRAVENOUS | Status: DC

## 2018-04-22 MED ORDER — ONDANSETRON HCL 4 MG/2ML IJ SOLN: 4.0000 mg | Freq: Four times a day (QID) | INTRAMUSCULAR | Status: DC | PRN

## 2018-04-22 MED ORDER — ASPIRIN 81 MG PO CHEW: 81.0000 mg | CHEWABLE_TABLET | Freq: Every day | ORAL | Status: DC

## 2018-04-22 MED ORDER — TICAGRELOR 90 MG PO TABS
90.0000 mg | ORAL_TABLET | Freq: Two times a day (BID) | ORAL | Status: DC
  Administered 2018-04-22 – 2018-04-23 (×2): 90 mg via ORAL
  Filled 2018-04-22 (×2): qty 1

## 2018-04-22 MED ORDER — NITROGLYCERIN 1 MG/10 ML FOR IR/CATH LAB
INTRA_ARTERIAL | Status: DC | PRN
  Administered 2018-04-22: 200 ug via INTRACORONARY

## 2018-04-22 SURGICAL SUPPLY — 16 items
BALLN EMERGE MR 2.0X12 (BALLOONS) ×2
BALLN SAPPHIRE 2.0X12 (BALLOONS) ×2
BALLN SAPPHIRE ~~LOC~~ 2.5X15 (BALLOONS) ×2 IMPLANT
BALLOON EMERGE MR 2.0X12 (BALLOONS) ×1 IMPLANT
BALLOON SAPPHIRE 2.0X12 (BALLOONS) ×1 IMPLANT
CATH INFINITI JR4 5F (CATHETERS) ×2 IMPLANT
CATH VISTA GUIDE 6FR XBLAD3.5 (CATHETERS) ×2 IMPLANT
KIT ENCORE 26 ADVANTAGE (KITS) ×2 IMPLANT
KIT HEART LEFT (KITS) ×2 IMPLANT
PACK CARDIAC CATHETERIZATION (CUSTOM PROCEDURE TRAY) ×2 IMPLANT
SHEATH PINNACLE 6F 10CM (SHEATH) ×2 IMPLANT
STENT SYNERGY DES 2.25X20 (Permanent Stent) ×2 IMPLANT
TRANSDUCER W/STOPCOCK (MISCELLANEOUS) ×2 IMPLANT
TUBING CIL FLEX 10 FLL-RA (TUBING) ×2 IMPLANT
WIRE ASAHI PROWATER 180CM (WIRE) ×4 IMPLANT
WIRE EMERALD 3MM-J .035X150CM (WIRE) ×2 IMPLANT

## 2018-04-22 NOTE — ED Provider Notes (Signed)
[facility] DEPARTMENT Provider Note   CSN: [id number] Arrival date & time: 04/22/18  0350     History   Chief Complaint Chief Complaint  Patient presents with  . Chest Pain  . Shortness of Breath    HPI Cathy Bell is a 57 y.o. female.  The history is provided by the patient and medical records.    57 year old female with history of MI with stent to RCA, coronary artery disease, hyperlipidemia, thyroid nodules, sleep apnea, presenting to the ED for chest pain and shortness of breath.  Patient had onset of pain around 0230 that woke up her from sleep.  Pain mid-sternal with radiation into the neck.  Had some associated SOB.  Patient did receive ASA and 2 NTG en route with improvement of pain from 8/10 to 2/10.  Patient has been having some issues with exertional chest pain over the past few months.  Has been followed by cardiology, Dr. Gwenlyn Found.  She had a recent coronary CTA on 03/26/18 which showed a moderate mid LAD lesion.  She is actually due to cath this morning at 0730.  Past Medical History:  Diagnosis Date  . Acute ST elevation myocardial infarction (STEMI) of inferior wall ([place]) 09/26/2017   Pt presented 09/26/17 with an acute inferior STEMI. Door to balloon time 30 minutes.   . Coronary artery disease   . Heart disease   . Hyperlipidemia   . Sleep apnea 2007   uses c pap on occasion    Patient Active Problem List   Diagnosis Date Noted  . Hot flashes due to menopause 12/08/2017  . History of gestational diabetes 11/03/2017  . Brain lesion 11/03/2017  . Multiple thyroid nodules 11/03/2017  . Rosacea 11/03/2017  . Overweight (BMI 25.0-29.9) 11/03/2017  . CAD S/P percutaneous coronary angioplasty 10/20/2017  . Exertional chest pain 10/20/2017  . Familial hyperlipidemia   . Acute ST elevation myocardial infarction (STEMI) of inferior wall ([place]) 09/26/2017  . FATIGUE, CHRONIC 03/10/2007    Past Surgical History:  Procedure  Laterality Date  . CARDIAC CATHETERIZATION    . CORONARY/GRAFT ACUTE MI REVASCULARIZATION N/A 09/26/2017   Procedure: Coronary/Graft Acute MI Revascularization;  Surgeon: Lorretta Harp, MD;  Location: [facility];  Service: Cardiovascular;  Laterality: N/A;  . LEFT HEART CATH AND CORONARY ANGIOGRAPHY N/A 09/26/2017   Procedure: LEFT HEART CATH AND CORONARY ANGIOGRAPHY;  Surgeon: Lorretta Harp, MD;  Location: [facility];  Service: Cardiovascular;  Laterality: N/A;  . nodulectomy     [place] Nodular Degeneration  . STENT PLACEMENT VASCULAR ([place] HX)  2019  . TONSILLECTOMY AND ADENOIDECTOMY  1967  . WISDOM TOOTH EXTRACTION  1981     OB History   None      Home Medications    Prior to Admission medications   Medication Sig Start Date End Date Taking? Authorizing Provider  acetaminophen (TYLENOL) 500 MG tablet Take 500-1,000 mg by mouth every 6 (six) hours as needed (for pain.).    [provider]  aspirin 81 MG chewable tablet Chew 1 tablet (81 mg total) by mouth daily. Patient taking differently: Chew 81 mg by mouth daily at 6 (six) AM. 0630 09/30/17   Kroeger, Lorelee Cover., PA-C  atorvastatin (LIPITOR) 80 MG tablet Take 1 tablet (80 mg total) by mouth daily at 6 PM. Patient taking differently: Take 80 mg by mouth daily at 6 PM. (1830) 09/29/17   Kroeger, Lorelee Cover., PA-C  azithromycin (ZITHROMAX) 250  MG tablet Take two tabs on day 1, then one tab daily x 4 days Patient not taking: Reported on 04/20/2018 03/24/18   Inda Coke, PA  Camphor-Eucalyptus-Menthol (VICKS CASERO EX) Apply 1 application topically.    [provider]  Cholecalciferol (VITAMIN D3) 5000 units TABS Take 5,000 Units by mouth daily at 6 (six) AM. 0630    [provider]  CRANBERRY PO Take 3 capsules by mouth 3 (three) times daily as needed (for urinary tract infection symptoms.).    [provider]  D-Mannose 500 MG CAPS Take 500 mg by mouth 3 (three) times daily  as needed (for urinary tract infection symptoms.).    [provider]  ezetimibe (ZETIA) 10 MG tablet Take 1 tablet (10 mg total) by mouth daily. Patient taking differently: Take 10 mg by mouth daily at 6 PM. 1830 01/13/18 04/20/18  Hilty, Nadean Corwin, MD  loratadine (CLARITIN) 10 MG tablet Take 10 mg by mouth daily as needed for allergies.    [provider]  nitroGLYCERIN (NITROSTAT) 0.4 MG SL tablet Place 1 tablet (0.4 mg total) under the tongue every 5 (five) minutes x 3 doses as needed for chest pain. 09/29/17   Kroeger, Lorelee Cover., PA-C  Omega-3 Fatty Acids (SUPER OMEGA-3) 1000 MG CAPS Take 1,000 mg by mouth daily at 6 (six) AM. 0630    [provider]  Probiotic Product (PROBIOTIC ADVANCED PO) Take 1 capsule by mouth daily at 6 (six) AM. (0630) Suprema Dophilus    [provider]  sodium chloride (MURO 128) 5 % ophthalmic ointment Place 1 application into both eyes at bedtime.    [provider]  sodium chloride (MURO 128) 5 % ophthalmic solution Place 1 drop into both eyes 3 (three) times daily.     [provider]  ticagrelor (BRILINTA) 90 MG TABS tablet Take 1 tablet (90 mg total) by mouth 2 (two) times daily. Patient taking differently: Take 90 mg by mouth 2 (two) times daily. 0630 & 1830 09/29/17   Abigail Butts., PA-C    Family History Family History  Problem Relation Age of Onset  . Colon cancer Father        healthy until 30s then developed  . Atrial fibrillation Father        pacemaker  . Hyperlipidemia Mother   . Other Mother        pacemaker. she is very short stature. healthy until 34s  . Breast cancer Sister        stage 0   . Hashimoto's thyroiditis Sister   . Stroke Son        due to PFO  . Diabetes Maternal Grandfather     Social History Social History   Tobacco Use  . Smoking status: Never Smoker  . Smokeless tobacco: Never Used  Substance Use Topics  . Alcohol use: No  . Drug use: No     Allergies     Cefprozil; Cheese; Peanut-containing drug products; Prednisolone; Salmon [fish allergy]; and Wellbutrin [bupropion hcl]   Review of Systems Review of Systems  Cardiovascular: Positive for chest pain.  All other systems reviewed and are negative.    Physical Exam Updated Vital Signs BP (!) 89/61   Pulse 83   Temp 98 F (36.7 C) (Oral)   Resp 16   Ht 5' 2.5" (1.588 m)   Wt 72.6 kg   LMP 03/18/2011   SpO2 98%   BMI 28.81 kg/m   Physical Exam  Constitutional:  She is oriented to person, place, and time. She appears well-developed and well-nourished.  Talking on the phone with family, NAD  HENT:  Head: Normocephalic and atraumatic.  Mouth/Throat: Oropharynx is clear and moist.  Eyes: Pupils are equal, round, and reactive to light. Conjunctivae and EOM are normal.  Neck: Normal range of motion.  Cardiovascular: Normal rate, regular rhythm and normal heart sounds.  Pulmonary/Chest: Effort normal and breath sounds normal.  Abdominal: Soft. Bowel sounds are normal.  Musculoskeletal: Normal range of motion.  Neurological: She is alert and oriented to person, place, and time.  Skin: Skin is warm and dry.  Psychiatric: She has a normal mood and affect.  Nursing note and vitals reviewed.    ED Treatments / Results  Labs (all labs ordered are listed, but only abnormal results are displayed) Labs Reviewed  CBC WITH DIFFERENTIAL/PLATELET - Abnormal; Notable for the following components:      Result Value   WBC 11.4 (*)    Hemoglobin 11.9 (*)    Neutro Abs 9.5 (*)    All other components within normal limits  BASIC METABOLIC PANEL - Abnormal; Notable for the following components:   Glucose, Bld 133 (*)    Calcium 8.8 (*)    All other components within normal limits  I-STAT TROPONIN, ED    EKG None  Radiology Dg Chest 2 View  Result Date: 04/22/2018 CLINICAL DATA:  Mid chest pain tonight EXAM: CHEST - 2 VIEW COMPARISON:  None. FINDINGS: The heart size and mediastinal  contours are within normal limits. Both lungs are clear. The visualized skeletal structures are unremarkable. IMPRESSION: No active cardiopulmonary disease. Electronically Signed   By: Lucienne Capers M.D.   On: 04/22/2018 05:18    Procedures Procedures (including critical care time)  Medications Ordered in ED Medications - No data to display   Initial Impression / Assessment and Plan / ED Course  I have reviewed the triage vital signs and the nursing notes.  Pertinent labs & imaging results that were available during my care of the patient were reviewed by me and considered in my medical decision making (see chart for details).  57 y.o. F here with chest pain that woke her from sleep.  Has known LAD lesion, actually due for cath this morning with Dr. Gwenlyn Found.  EKG non-ischemic.  Labs reassuring, troponin negative.  CXR clear.  Discussed with on call cardiology, Dr. Radford Pax-- recommends just hold here, will get morning team to see her and go forward with cath.    Spoke with Dr. Harrington Challenger-- team will come evaluate patient and get her up to cath lab this morning as scheduled.  Final Clinical Impressions(s) / ED Diagnoses   Final diagnoses:  Chest pain, unspecified type    ED Discharge Orders    None       Larene Pickett, PA-C 04/22/18 5638    Orpah Greek, MD 04/22/18 [phone]

## 2018-04-22 NOTE — Progress Notes (Signed)
Site area: rt groin fa sheath Site Prior to Removal:  Level 0 Pressure Applied For: 25 minutes Manual:   yes Patient Status During Pull:  yes Post Pull Site:  Level  0 Post Pull Instructions Given:  yes Post Pull Pulses Present: rt dp palpable Dressing Applied:  Gauze and tegaderm Bedrest begins @ 7867 Comments:

## 2018-04-22 NOTE — ED Triage Notes (Signed)
  Patient BIB EMS for chest pain and SOB.  Patient woke up around 0238 with mid sternal chest pain that radiated to neck.  Patient had MI in March of this year and had stent placed.  Patient was SOB and called EMS.  Was given 324 ASA and 2 nitro with relief.  Pain from 8/10 to 2/10 on arrival.  Patient was scheduled for cardiac cath this morning at 0730 with Dr. Gwenlyn Found.  Patient is A&O x4.  No SOB at this time.

## 2018-04-22 NOTE — Interval H&P Note (Signed)
Cath Lab Visit (complete for each Cath Lab visit)  Clinical Evaluation Leading to the Procedure:   ACS: Yes.    Non-ACS:    Anginal Classification: CCS III  Anti-ischemic medical therapy: No Therapy  Non-Invasive Test Results: No non-invasive testing performed  Prior CABG: No previous CABG      History and Physical Interval Note:  04/22/2018 11:09 AM  Frutoso Schatz  has presented today for surgery, with the diagnosis of abnormal ct  The various methods of treatment have been discussed with the patient and family. After consideration of risks, benefits and other options for treatment, the patient has consented to  Procedure(s): CORONARY STENT INTERVENTION (N/A) as a surgical intervention .  The patient's history has been reviewed, patient examined, no change in status, stable for surgery.  I have reviewed the patient's chart and labs.  Questions were answered to the patient's satisfaction.     Quay Burow

## 2018-04-23 ENCOUNTER — Other Ambulatory Visit: Payer: Self-pay

## 2018-04-23 ENCOUNTER — Encounter (HOSPITAL_COMMUNITY): Payer: Self-pay | Admitting: Cardiovascular Disease

## 2018-04-23 DIAGNOSIS — I200 Unstable angina: Secondary | ICD-10-CM

## 2018-04-23 DIAGNOSIS — E663 Overweight: Secondary | ICD-10-CM | POA: Diagnosis not present

## 2018-04-23 DIAGNOSIS — I10 Essential (primary) hypertension: Secondary | ICD-10-CM | POA: Diagnosis not present

## 2018-04-23 DIAGNOSIS — I25110 Atherosclerotic heart disease of native coronary artery with unstable angina pectoris: Secondary | ICD-10-CM | POA: Diagnosis not present

## 2018-04-23 DIAGNOSIS — Z955 Presence of coronary angioplasty implant and graft: Secondary | ICD-10-CM | POA: Diagnosis not present

## 2018-04-23 LAB — CBC
HEMATOCRIT: 37.8 % (ref 36.0–46.0)
Hemoglobin: 11.7 g/dL — ABNORMAL LOW (ref 12.0–15.0)
MCH: 27.6 pg (ref 26.0–34.0)
MCHC: 31 g/dL (ref 30.0–36.0)
MCV: 89.2 fL (ref 80.0–100.0)
NRBC: 0 % (ref 0.0–0.2)
PLATELETS: 158 10*3/uL (ref 150–400)
RBC: 4.24 MIL/uL (ref 3.87–5.11)
RDW: 14.2 % (ref 11.5–15.5)
WBC: 6.5 10*3/uL (ref 4.0–10.5)

## 2018-04-23 LAB — BASIC METABOLIC PANEL
Anion gap: 8 (ref 5–15)
BUN: 12 mg/dL (ref 6–20)
CHLORIDE: 108 mmol/L (ref 98–111)
CO2: 24 mmol/L (ref 22–32)
Calcium: 8.7 mg/dL — ABNORMAL LOW (ref 8.9–10.3)
Creatinine, Ser: 0.59 mg/dL (ref 0.44–1.00)
GFR calc Af Amer: >60 mL/min (ref >60)
GFR calc non Af Amer: >60 mL/min (ref >60)
GLUCOSE: 87 mg/dL (ref 70–99)
Potassium: 3.6 mmol/L (ref 3.5–5.1)
Sodium: 140 mmol/L (ref 135–145)

## 2018-04-23 MED ORDER — INFLUENZA VAC SPLIT QUAD 0.5 ML IM SUSY
0.5000 mL | PREFILLED_SYRINGE | INTRAMUSCULAR | Status: AC
  Administered 2018-04-23: 0.5 mL via INTRAMUSCULAR
  Filled 2018-04-23: qty 0.5

## 2018-04-23 MED FILL — Heparin Sod (Porcine)-NaCl IV Soln 1000 Unit/500ML-0.9%: INTRAVENOUS | Qty: 1000 | Status: AC

## 2018-04-23 NOTE — Discharge Summary (Signed)
Discharge Summary    Patient ID: Cathy Bell MRN: [id number]; DOB: [birth year]  Admit date: 04/22/2018 Discharge date: 04/23/2018  Primary Care Provider: Marin Olp, MD  Primary Cardiologist: Cathy Burow, MD   Discharge Diagnoses    Active Problems:   CAD (coronary artery disease)   Unstable angina (HCC)   HTN  HLD  Allergies Allergies  Allergen Reactions  . Cefprozil Hives and Other (See Comments)  . Cheese Hives  . Peanut-Containing Drug Products Hives  . Prednisolone     Caused dangerously high eye pressure around time of eye surgery  . Salmon [Fish Allergy] Hives  . Wellbutrin [Bupropion Hcl] Hives    Diagnostic Studies/Procedures    CORONARY STENT INTERVENTION  Conclusion     Previously placed Prox RCA to Mid RCA stent (unknown type) is widely patent.  Prox LAD-1 lesion is 50% stenosed.  Prox LAD-2 lesion is 75% stenosed.  Ost 1st Diag lesion is 80% stenosed.  A stent was successfully placed.  Post intervention, there is a 0% residual stenosis.  Post intervention, there is a 0% residual stenosis.     IMPRESSION: Successful LAD PCI and drug-eluting stenting with PTCA of the origin of the first diagonal branch because of "plaque shift with excellent result.  The previously placed RCA stent was widely patent.  Angiomax was turned off.  The patient left the lab in stable condition.  The sheath will be removed in several hours and pressure held.  She will remain on aspirin and Brilinta, and will be discharged home in the morning.  She left the lab in stable condition.  Diagnostic Diagram       Post-Intervention Diagram           History of Present Illness     Cathy Bell is a 57 y.o.  with hx of CAD s/p DES to [facility] and medically managed LAD, HTN, HLD presented for LAD intervention.   She presented on 09/26/2017 with chest pain while walking in the park. EKG showed inferior ST segment elevation with reciprocal anterior  depression.  Cath showed occluded mid RCA with a synergy drug-eluting stent. She did have inferobasal hypokinesia which improved by 2D echo the following day. She did have an 80% mid LAD lesion with a subsequent negative Myoview stress test. Her major complaints are of fatigue. Her other problems include hyperlipidemia on high-dose statin therapy with mildly elevated liver function tests. She is on dual antiplatelet therapy with Brilinta.  She had progressive chest pressure especially when walking up an incline.  A recent coronary CTA performed 03/26/2018 confirmed a moderate mid LAD lesion but unfortunately FFR could not be performed. Recently progressive symptoms of decided to proceed with elective outpatient LAD intervention.  Hospital Course     Consultants:None  Successful LAD PCI and drug-eluting stenting with PTCA of the origin of the first diagonal branch because of "plaque shift with excellent result.  The previously placed RCA stent was widely patent. No post procedural complications. Renal function stable. Ambulated well. Felt stable for discharge. She will remained on home medication with DAPT.    Discharge Vitals Blood pressure (!) 92/54, pulse 73, temperature 97.8 F (36.6 C), resp. rate 19, height 5' 2.5" (1.588 m), weight 73 kg, last menstrual period 03/18/2011, SpO2 97 %.  Filed Weights   04/22/18 0441 04/23/18 0354  Weight: 72.6 kg 73 kg   Physical Exam  Constitutional: She is oriented to person, place, and time and well-developed, well-nourished, and in no distress.  HENT:  Head: Normocephalic and atraumatic.  Eyes: Pupils are equal, round, and reactive to light.  Neck: Normal range of motion. Neck supple.  Cardiovascular: Normal rate and regular rhythm.  Right groin cath site without hematoma or bruise  Pulmonary/Chest: Effort normal and breath sounds normal.  Abdominal: Soft. Bowel sounds are normal.  Musculoskeletal: Normal range of motion.  Neurological: She is  alert and oriented to person, place, and time.  Skin: Skin is warm and dry.  Psychiatric: Affect normal.    Labs & Radiologic Studies    CBC Recent Labs    04/20/18 1546 04/22/18 0458 04/23/18 0424  WBC 9.2 11.4* 6.5  NEUTROABS 6.0 9.5*  --   HGB 12.9 11.9* 11.7*  HCT 39.1 37.0 37.8  MCV 84 87.7 89.2  PLT 193 163 950   Basic Metabolic Panel Recent Labs    04/22/18 0458 04/23/18 0424  NA 138 140  K 3.8 3.6  CL 106 108  CO2 24 24  GLUCOSE 133* 87  BUN 18 12  CREATININE 0.61 0.59  CALCIUM 8.8* 8.7*    Recent Labs    04/20/18 1546  TSH 1.850   _____________  Dg Chest 2 View  Result Date: 04/22/2018 CLINICAL DATA:  Mid chest pain tonight EXAM: CHEST - 2 VIEW COMPARISON:  None. FINDINGS: The heart size and mediastinal contours are within normal limits. Both lungs are clear. The visualized skeletal structures are unremarkable. IMPRESSION: No active cardiopulmonary disease. Electronically Signed   By: Lucienne Capers M.D.   On: 04/22/2018 05:18   Ct Coronary Morph W/cta Cor W/score W/ca W/cm &/or Wo/cm  Addendum Date: 03/26/2018   ADDENDUM REPORT: 03/26/2018 17:03 EXAM: OVER-READ INTERPRETATION  CT CHEST The following report is an over-read performed by radiologist Dr. Samara Sn[facility] on 03/26/2018. This over-read does not include interpretation of cardiac or coronary anatomy or pathology. The coronary CTA interpretation by the cardiologist is attached. COMPARISON:  None. FINDINGS: Cardiovascular: No significant pericardial effusion/thickening. Great vessels are normal in course and caliber. No central pulmonary emboli. Mediastinum/Nodes: Unremarkable esophagus. No pathologically enlarged mediastinal or hilar lymph nodes. Lungs/Pleura: No pneumothorax. No pleural effusion. No acute consolidative airspace disease, lung masses or significant pulmonary nodules. Upper abdomen: No acute abnormality. Musculoskeletal: No aggressive appearing focal osseous  lesions. Mild thoracic spondylosis. IMPRESSION: No significant extracardiac findings. Electronically Signed   By: Ilona Sorrel M.D.   On: 03/26/2018 17:03   Result Date: 03/26/2018 CLINICAL DATA:  Chest pain EXAM: Cardiac CTA MEDICATIONS: Sub lingual nitro.  4mg  x 2 and lopressor 5mg  IV TECHNIQUE: The patient was scanned on a Siemens 932 slice scanner. Gantry rotation speed was 250 msecs. Collimation was 0.6 mm. A 100 kV prospective scan was triggered in the ascending thoracic aorta at 35-75% of the R-R interval. Average HR during the scan was 60 bpm. The 3D data set was interpreted on a dedicated work station using MPR, MIP and VRT modes. A total of 80cc of contrast was used. FINDINGS: Non-cardiac: See separate report from [facility]. The pulmonary veins drain normally to the left atrium. Calcium Score: Cannot do because of stent. Coronary Arteries: Right dominant with no anomalies LM: No plaque or stenosis. LAD system: Mixed plaque proximal and mid LAD. Possible moderate stenosis in the mid LAD. Circumflex system: Calcified plaque mid LCx, mild stenosis. RCA system: Patent proximal RCA stent. Just prior to stent, there appears to be about 50% proximal LAD stenosis. IMPRESSION: 1. Patent proximal RCA stent. There appears to  be about 50% proximal RCA stenosis just proximal to the stent. 2.  Possible moderate mid LAD stenosis. Will send FFR to assess LAD (cannot assess RCA due to stent). [facility] technician Electronically Signed: By: Loralie Champagne M.D. On: 03/26/2018 15:10   Ct Coronary Fractional Flow Reserve Data Prep  Result Date: 04/07/2018 CLINICAL DATA:  Chest pain EXAM: CT FFR MEDICATIONS: No additional medications. TECHNIQUE: The coronary CT angiogram was sent for FFR. FINDINGS: FFR unable to be done due to artifact on study. [facility] Mclean Electronically Signed   By: Loralie Champagne M.D.   On: 04/02/2018 16:45   Ct Coronary Fractional Flow Reserve Fluid Analysis  Result Date: 04/07/2018 CLINICAL  DATA:  Chest pain EXAM: CT FFR MEDICATIONS: No additional medications. TECHNIQUE: The coronary CT angiogram was sent for FFR. FINDINGS: FFR unable to be done due to artifact on study. [facility] Mclean Electronically Signed   By: Loralie Champagne M.D.   On: 04/02/2018 16:45   Disposition   Pt is being discharged home today in good condition.  Follow-up Plans & Appointments    Follow-up Information    Lorretta Harp, MD. Go on 05/11/2018.   Specialties:  Cardiology, Radiology Why:  @1 :45 for post hospital follow up  Contact information: [address] [place] [address] [phone]          Discharge Instructions    AMB Referral to Cardiac Rehabilitation - Phase II   Complete by:  As directed    Diagnosis:  Coronary Stents   Diet - low sodium heart healthy   Complete by:  As directed    Discharge instructions   Complete by:  As directed    No driving for 48 hours. No lifting over 5 lbs for 1 week. No sexual activity for 1 week. You may return to work on 04/27/18. Keep procedure site clean & dry. If you notice increased pain, swelling, bleeding or pus, call/return!  You may shower, but no soaking baths/hot tubs/pools for 1 week.   Increase activity slowly   Complete by:  As directed       Discharge Medications   Allergies as of 04/23/2018      Reactions   Cefprozil Hives, Other (See Comments)   Cheese Hives   Peanut-containing Drug Products Hives   Prednisolone    Caused dangerously high eye pressure around time of eye surgery   Salmon [fish Allergy] Hives   Wellbutrin [bupropion Hcl] Hives      Medication List    TAKE these medications   acetaminophen 500 MG tablet Commonly known as:  TYLENOL Take 500-1,000 mg by mouth every 6 (six) hours as needed (for pain.).   aspirin 81 MG chewable tablet Chew 1 tablet (81 mg total) by mouth daily. What changed:    when to take this  additional instructions   atorvastatin 80 MG tablet Commonly known  as:  LIPITOR Take 1 tablet (80 mg total) by mouth daily at 6 PM. What changed:  additional instructions   CRANBERRY PO Take 3 capsules by mouth 3 (three) times daily as needed (for urinary tract infection symptoms.).   D-Mannose 500 MG Caps Take 500 mg by mouth 3 (three) times daily as needed (for urinary tract infection symptoms.).   ezetimibe 10 MG tablet Commonly known as:  ZETIA Take 1 tablet (10 mg total) by mouth daily. What changed:    when to take this  additional instructions   loratadine 10 MG tablet Commonly known as:  CLARITIN Take 10 mg by mouth daily as needed for allergies.   nitroGLYCERIN 0.4 MG SL tablet Commonly known as:  NITROSTAT Place 1 tablet (0.4 mg total) under the tongue every 5 (five) minutes x 3 doses as needed for chest pain.   PROBIOTIC ADVANCED PO Take 1 capsule by mouth daily at 6 (six) AM. (0630) Suprema Dophilus   sodium chloride 5 % ophthalmic ointment Commonly known as:  MURO 528 Place 1 application into both eyes at bedtime.   sodium chloride 5 % ophthalmic solution Commonly known as:  MURO 128 Place 1 drop into both eyes 3 (three) times daily.   SUPER OMEGA-3 1000 MG Caps Take 1,000 mg by mouth daily at 6 (six) AM. 0630   ticagrelor 90 MG Tabs tablet Commonly known as:  BRILINTA Take 1 tablet (90 mg total) by mouth 2 (two) times daily. What changed:  additional instructions   VICKS CASERO EX Apply 1 application topically.   Vitamin D3 5000 units Tabs Take 5,000 Units by mouth daily at 6 (six) AM. 0630        Acute coronary syndrome (MI, NSTEMI, STEMI, etc) this admission?: No.    Outstanding Labs/Studies   N/A  Duration of Discharge Encounter   Greater than 30 minutes including physician time.  Signed, Leanor Kail, PA 04/23/2018, 8:22 AM

## 2018-04-23 NOTE — Progress Notes (Signed)
CARDIAC REHAB PHASE I   PRE:  Rate/Rhythm: 76 SR  BP:  Supine: 96/49  Sitting:   Standing:    SaO2: 97%RA  MODE:  Ambulation: 1000 ft   POST:  Rate/Rhythm: 81 SR  BP:  Supine:   Sitting: 98/68  Standing:    SaO2: 99%RA 0810-0902 Pt walked 1000 ft on RA with steady gait and no CP. Tolerated well. Reviewed NTG use, ex ed, heart healthy food choices, importance of brilinta with stent( has been on brilinta). Discussed CRP 2 and will refer back to [facility]. Was unable to attend last referral due to SOB. No DOE noted with walk.   Graylon Good, RN BSN  04/23/2018 8:59 AM

## 2018-04-24 ENCOUNTER — Other Ambulatory Visit: Payer: Self-pay | Admitting: Adult Health

## 2018-04-26 NOTE — Telephone Encounter (Signed)
Pantoprazole not on med list. Refilled refused

## 2018-04-27 ENCOUNTER — Telehealth (HOSPITAL_COMMUNITY): Payer: Self-pay

## 2018-04-27 NOTE — Telephone Encounter (Signed)
Called patient to see if she was interested in participating in the Cardiac Rehab Program. Patient stated yes. Patient will come in for orientation on 06/24/18 @ 8:30AM and will attend the 11:15AM exercise class.  Mailed homework package.

## 2018-04-27 NOTE — Telephone Encounter (Signed)
Pt insurance is active and benefits verified through [facility]. Co-pay $0.00, DED $6,750.00/$6,750.00 met, out of pocket $6,750.00/$6,750.00 met, co-insurance 0%. No pre-authorization. [employer]port, 04/27/18 @ 2:07PM, REF# [id number]

## 2018-04-28 ENCOUNTER — Encounter (HOSPITAL_COMMUNITY): Payer: Self-pay | Admitting: Cardiovascular Disease

## 2018-04-28 DIAGNOSIS — E7849 Other hyperlipidemia: Secondary | ICD-10-CM | POA: Diagnosis not present

## 2018-04-28 DIAGNOSIS — E785 Hyperlipidemia, unspecified: Secondary | ICD-10-CM | POA: Diagnosis not present

## 2018-04-29 DIAGNOSIS — E7849 Other hyperlipidemia: Secondary | ICD-10-CM | POA: Diagnosis not present

## 2018-04-29 DIAGNOSIS — E785 Hyperlipidemia, unspecified: Secondary | ICD-10-CM | POA: Diagnosis not present

## 2018-05-05 ENCOUNTER — Telehealth: Payer: Self-pay | Admitting: Family Medicine

## 2018-05-05 ENCOUNTER — Encounter: Payer: Self-pay | Admitting: Family Medicine

## 2018-05-05 NOTE — Telephone Encounter (Signed)
See note  Copied from [place] [id number]. Topic: General - Other >> May 05, 2018 11:17 AM Oneta Rack wrote: Patient scheduled  follow up with Dr. Yong Channel for 05/06/18 at 11:30am. Patient states there mutliple concerns she would like to discuss with Dr. Yong Channel which are the following: ED chest pain follow up and ear infection.   Patient was adamant about seeing PCP as soon as possible. Attempted to reach the office seeking approval for the time and day given but was unsuccessful, patient did not want a return call and wanted to be scheduled. Please advise if patient concern (s) can be addressed at the day and time given, if not please advise patient directly.

## 2018-05-05 NOTE — Telephone Encounter (Signed)
FYI

## 2018-05-05 NOTE — Telephone Encounter (Signed)
Cathy Bell- this is fine with me- please let Cathy Bell with [facility] know that we are grateful for her helping the patient.

## 2018-05-06 ENCOUNTER — Ambulatory Visit (INDEPENDENT_AMBULATORY_CARE_PROVIDER_SITE_OTHER): Payer: BLUE CROSS/BLUE SHIELD | Admitting: Family Medicine

## 2018-05-06 ENCOUNTER — Encounter: Payer: Self-pay | Admitting: Family Medicine

## 2018-05-06 VITALS — BP 98/68 | HR 78 | Temp 98.2°F | Ht 62.5 in | Wt 159.4 lb

## 2018-05-06 DIAGNOSIS — R5383 Other fatigue: Secondary | ICD-10-CM

## 2018-05-06 DIAGNOSIS — I2510 Atherosclerotic heart disease of native coronary artery without angina pectoris: Secondary | ICD-10-CM

## 2018-05-06 DIAGNOSIS — Z9861 Coronary angioplasty status: Secondary | ICD-10-CM | POA: Diagnosis not present

## 2018-05-06 DIAGNOSIS — R7989 Other specified abnormal findings of blood chemistry: Secondary | ICD-10-CM | POA: Diagnosis not present

## 2018-05-06 LAB — CBC
HEMATOCRIT: 38.3 % (ref 36.0–46.0)
HEMOGLOBIN: 12.9 g/dL (ref 12.0–15.0)
MCHC: 33.5 g/dL (ref 30.0–36.0)
MCV: 86.1 fl (ref 78.0–100.0)
PLATELETS: 193 10*3/uL (ref 150.0–400.0)
RBC: 4.45 Mil/uL (ref 3.87–5.11)
RDW: 14.6 % (ref 11.5–15.5)
WBC: 7.6 10*3/uL (ref 4.0–10.5)

## 2018-05-06 LAB — COMPREHENSIVE METABOLIC PANEL
ALBUMIN: 4.5 g/dL (ref 3.5–5.2)
ALK PHOS: 98 U/L (ref 39–117)
ALT: 36 U/L — ABNORMAL HIGH (ref 0–35)
AST: 22 U/L (ref 0–37)
BUN: 20 mg/dL (ref 6–23)
CALCIUM: 9.7 mg/dL (ref 8.4–10.5)
CO2: 30 mEq/L (ref 19–32)
Chloride: 102 mEq/L (ref 96–112)
Creatinine, Ser: 0.82 mg/dL (ref 0.40–1.20)
GFR: 76 mL/min (ref >60.00)
Glucose, Bld: 95 mg/dL (ref 70–99)
POTASSIUM: 4.6 meq/L (ref 3.5–5.1)
Sodium: 139 mEq/L (ref 135–145)
TOTAL PROTEIN: 7.2 g/dL (ref 6.0–8.3)
Total Bilirubin: 1.1 mg/dL (ref 0.2–1.2)

## 2018-05-06 LAB — VITAMIN D 25 HYDROXY (VIT D DEFICIENCY, FRACTURES): VITD: 55.55 ng/mL (ref 30.00–100.00)

## 2018-05-06 LAB — TSH: TSH: 1.32 u[IU]/mL (ref 0.35–4.50)

## 2018-05-06 NOTE — Assessment & Plan Note (Signed)
S: Patient with known coronary artery disease.  History of stent to occluded RCA back in March.  At present she had a stent placed in her mid LAD lesion- this was detected during coronary CTA evaluation on 03/26/2018-electively treated on 04/22/2018.  She has an upcoming cardiology appointment on 10/29. She is compliant with aspirin, statin, brilinta A/P: stable- continue current meds. Thankful for recent cardiology intervention.

## 2018-05-06 NOTE — Patient Instructions (Addendum)
Health Maintenance Due  Topic Date Due  . COLONOSCOPY  08/04/2016   I agree with you- should avoid decongestants. You should avoid flonase as well.   For pressure portion/pain- certainly reasonable to schedule tylenol 500-650mg  every 6 hours.   You can take an antihistamine like zyrtec, xyzal, claritin, allegra- or any generic version of these.   Please stop by lab before you go

## 2018-05-06 NOTE — Telephone Encounter (Signed)
Patient was seen in office this morning. 

## 2018-05-06 NOTE — Progress Notes (Signed)
Subjective:  Cathy Bell is a 57 y.o. year old very pleasant female patient who presents for/with See problem oriented charting ROS-fatigue, mild disorientation, ear fullness on left, mild sore throat, difficulty taking full deep breath.  Past Medical History-  Patient Active Problem List   Diagnosis Date Noted  . CAD S/P percutaneous coronary angioplasty 10/20/2017    Priority: High  . Hot flashes due to menopause 12/08/2017    Priority: Medium  . History of gestational diabetes 11/03/2017    Priority: Medium  . Overweight (BMI 25.0-29.9) 11/03/2017    Priority: Medium  . Familial hyperlipidemia     Priority: Medium  . Brain lesion 11/03/2017    Priority: Low  . Multiple thyroid nodules 11/03/2017    Priority: Low  . Rosacea 11/03/2017    Priority: Low  . Exertional chest pain 10/20/2017    Priority: Low  . FATIGUE, CHRONIC 03/10/2007    Priority: Low  . CAD (coronary artery disease) 04/22/2018  . Unstable angina ([place])   . Acute ST elevation myocardial infarction (STEMI) of inferior wall (HCC) 09/26/2017    Medications- reviewed and updated Current Outpatient Medications  Medication Sig Dispense Refill  . acetaminophen (TYLENOL) 500 MG tablet Take 500-1,000 mg by mouth every 6 (six) hours as needed (for pain.).    Marland Kitchen aspirin 81 MG chewable tablet Chew 1 tablet (81 mg total) by mouth daily. (Patient taking differently: Chew 81 mg by mouth daily at 6 (six) AM. 0630) 90 tablet 3  . atorvastatin (LIPITOR) 80 MG tablet Take 1 tablet (80 mg total) by mouth daily at 6 PM. (Patient taking differently: Take 80 mg by mouth daily at 6 PM. (1830)) 90 tablet 3  . Camphor-Eucalyptus-Menthol (VICKS CASERO EX) Apply 1 application topically.    . Cholecalciferol (VITAMIN D3) 5000 units TABS Take 5,000 Units by mouth daily at 6 (six) AM. 0630    . CRANBERRY PO Take 3 capsules by mouth 3 (three) times daily as needed (for urinary tract infection symptoms.).    Marland Kitchen D-Mannose 500 MG CAPS  Take 500 mg by mouth 3 (three) times daily as needed (for urinary tract infection symptoms.).    Marland Kitchen loratadine (CLARITIN) 10 MG tablet Take 10 mg by mouth daily as needed for allergies.    . nitroGLYCERIN (NITROSTAT) 0.4 MG SL tablet Place 1 tablet (0.4 mg total) under the tongue every 5 (five) minutes x 3 doses as needed for chest pain. 25 tablet 3  . Omega-3 Fatty Acids (SUPER OMEGA-3) 1000 MG CAPS Take 1,000 mg by mouth daily at 6 (six) AM. 0630    . Probiotic Product (PROBIOTIC ADVANCED PO) Take 1 capsule by mouth daily at 6 (six) AM. (0630) Suprema Dophilus    . sodium chloride (MURO 128) 5 % ophthalmic ointment Place 1 application into both eyes at bedtime.    . sodium chloride (MURO 128) 5 % ophthalmic solution Place 1 drop into both eyes 3 (three) times daily.     . ticagrelor (BRILINTA) 90 MG TABS tablet Take 1 tablet (90 mg total) by mouth 2 (two) times daily. (Patient taking differently: Take 90 mg by mouth 2 (two) times daily. 0630 & 1830) 180 tablet 3  . ezetimibe (ZETIA) 10 MG tablet Take 1 tablet (10 mg total) by mouth daily. (Patient taking differently: Take 10 mg by mouth daily at 6 PM. 1830) 90 tablet 3   No current facility-administered medications for this visit.    Objective: BP 98/68 (BP Location: Left Arm,  Patient Position: Sitting, Cuff Size: Large)   Pulse 78   Temp 98.2 F (36.8 C) (Oral)   Ht 5' 2.5" (1.588 m)   Wt 159 lb 6.4 oz (72.3 kg)   LMP 03/18/2011   SpO2 97%   BMI 28.69 kg/m  Gen: NAD, resting comfortably Tympanic membranes normal bilaterally, pharynx with mild erythema and drainage, nares mildly erythematous with clear discharge. CV: RRR no murmurs rubs or gallops Lungs: CTAB no crackles, wheeze, rhonchi Abdomen: soft/nontender/nondistended/normal bowel sounds. No rebound or guarding.  Ext: no edema Skin: warm, dry Neuro: Speech normal, moves all extremities  Assessment/Plan:  Fatigue, unspecified type - Plan: CBC, Comprehensive metabolic panel,  TSH  Low vitamin D level - Plan: VITAMIN D 25 Hydroxy (Vit-D Deficiency, Fractures) S: Patient states she has been awakened in the middle of the night with a jolt-like sensation either in her stomach or feeling of congestion or for other reasons- Sometimes has to go to bathroom immediately and has urinary urgency or May get sneezing fit Or ear will really hurt. 2 nights ago-she was shaking for about 30 minutes. Last night luckily was able to sleep through the night. Still woke up with low energy level.   Feeling slight disorientation- feels like being sick for a year and that everything is running together as far as her illnesses. Feels like cant perform at normal level overall. Sleep apnea test have not yet been read by Dr. Claiborne Billings.  For at least a month-Cant take good deep breath. Did have x-ray- normal at time of recent hospitalization.   She does complain also of some potential allergy symptoms. Patient saw our team about a month ago for ear pain and symptoms resolved with antibiotic but she still has some discomfort and disorientation feeling like there is fluid in her ear. She has fullness in her ear. Mild roof of mouth pain today.   Has had symptoms such as hot flashes/waves of heat, nausea and lightheaded throughout the day on some days but not on others.  Feels some muscle pain and weakness.  She just feels overall like she does not have energy but does not feel really sick either. Mental weariness from just not feeling well this full year.   She is concerned about digestive issues as well. Bloated with veggies at dinner- particularly   Continues to do counseling- to help her deal with these stressors.   A/P: Concerning patient's nighttime awakenings-please note on morning she needed the stent -she woke up on morning with pressure in chest and felt sick on stomach and had large bout of diarrhea when had the stent and EMS transported to hospital-we discussed this was likely more traumatic  than she thought.  She likely has elevated anxiety around sleep with concern something may happen to her at night.  There is no consistent pattern with her waking up and her symptoms other than the waking up itself.  I think it is reasonable to monitor only at this point unless she has more persistent issues  I do think patient has some allergy symptoms and I advised an over-the-counter antihistamine.  She should avoid Flonase given prior steroid reaction.  I think she is hypersensitive to any physical symptoms given the difficulties she has had this year which is understandable.  We will update labs-I see no persistent pattern in her symptoms or cause that would tie these together.  Has various nonspecific not persistent symptoms-continue to monitor for emerging patterns  No problem-specific Assessment & Plan notes  found for this encounter.   Future Appointments  Date Time Provider [place]  05/11/2018  1:45 PM Lorretta Harp, MD [facility]  05/20/2018  8:00 AM Hilty, Nadean Corwin, MD [facility]  06/24/2018  8:30 AM [facility] PHASE II ORIENT [facility] None   Lab/Order associations: Fatigue, unspecified type - Plan: CBC, Comprehensive metabolic panel, TSH  Low vitamin D level - Plan: VITAMIN D 25 Hydroxy (Vit-D Deficiency, Fractures)  Time Stamp The duration of face-to-face time during this visit was greater than 25 minutes. Greater than 50% of this time was spent in counseling, explanation of diagnosis, planning of further management, and/or coordination of care including counseling patient on how difficult this year has been with her new and unexpected illnesses, attempting to comfort patient, exploring potential causes of physical symptoms., challenges of the slow moving diagnosis process for her this year and frustration she still cant get into cardiac rehab for another 2 months  Return precautions advised.  Garret Reddish, MD

## 2018-05-07 ENCOUNTER — Encounter (HOSPITAL_BASED_OUTPATIENT_CLINIC_OR_DEPARTMENT_OTHER): Payer: Self-pay | Admitting: Cardiovascular Disease

## 2018-05-07 ENCOUNTER — Ambulatory Visit: Payer: BLUE CROSS/BLUE SHIELD | Admitting: Cardiovascular Disease

## 2018-05-07 NOTE — Procedures (Signed)
     Patient Name: Cathy Bell, Cathy Bell Date: 04/19/2018 Gender: Female D.O.B: 12/24/1960 Age (years): 80 Referring Provider: Jory Sims NP Height (inches): 63 Interpreting Physician: Shelva Majestic MD, ABSM Weight (lbs): 160 RPSGT: Jacolyn Reedy BMI: 28 MRN: [id number] Neck Size: 14.00  CLINICAL INFORMATION Sleep Study Type: HST  Indication for sleep study: N/A  Epworth Sleepiness Score: 5  SLEEP STUDY TECHNIQUE A multi-channel overnight portable sleep study was performed. The channels recorded were: nasal airflow, thoracic respiratory movement, and oxygen saturation with a pulse oximetry. Snoring was also monitored.  MEDICATIONS     acetaminophen (TYLENOL) 500 MG tablet             aspirin 81 MG chewable tablet         atorvastatin (LIPITOR) 80 MG tablet         Camphor-Eucalyptus-Menthol (VICKS CASERO EX)         Cholecalciferol (VITAMIN D3) 5000 units TABS         CRANBERRY PO         D-Mannose 500 MG CAPS         ezetimibe (ZETIA) 10 MG tablet (Expired)         loratadine (CLARITIN) 10 MG tablet         nitroGLYCERIN (NITROSTAT) 0.4 MG SL tablet         Omega-3 Fatty Acids (SUPER OMEGA-3) 1000 MG CAPS         Probiotic Product (PROBIOTIC ADVANCED PO)         sodium chloride (MURO 128) 5 % ophthalmic ointment         sodium chloride (MURO 128) 5 % ophthalmic solution         ticagrelor (BRILINTA) 90 MG TABS tablet      Patient self administered medications include: N/A.  SLEEP ARCHITECTURE Patient was studied for 397.5 minutes. The sleep efficiency was 100.0 % and the patient was supine for 98.7%. The arousal index was 0.0 per hour.  RESPIRATORY PARAMETERS The overall AHI was 10.6 per hour, with a central apnea index of 0.0 per hour. REM related sleep apnea cannot be assessed on this home study.  The oxygen nadir was 88% during sleep.  CARDIAC DATA Mean heart rate during sleep was 60.6 bpm.  IMPRESSIONS - Mild obstructive sleep apnea  occurred during this study (AHI 10.6/h). - No significant central sleep apnea occurred during this study (CAI = 0.0/h). - Mild oxygen desaturation to a nadir of 88%. - Patient snored 0.1% during the sleep.  DIAGNOSIS - Obstructive Sleep Apnea (327.23 [G47.33 ICD-10])  RECOMMENDATIONS  - In this patient with significant cardiovascular co-morbidities recommend therapeutic CPAP titration to determine optimal pressure required to alleviate sleep disordered breathing. - Efforts should be made to optimize nasal and oropharyngeal patency. - A customized oral appliance may be considered if patient cannot tolerate CPAP. - Avoid alcohol, sedatives and other CNS depressants that may worsen sleep apnea and disrupt normal sleep architecture. - Sleep hygiene should be reviewed to assess factors that may improve sleep quality. - Weight management and regular exercise should be initiated or continued. - Recommend a download be obtained after CPAP initiation and sleep clijnic evaluation.  [Electronically signed] 05/07/2018 07:03 PM  Shelva Majestic MD, [facility], [place], American Board of Sleep Medicine   NPI: [id number] [place] PH: [phone]   FX: [phone] [place]

## 2018-05-10 ENCOUNTER — Other Ambulatory Visit: Payer: Self-pay | Admitting: Cardiology

## 2018-05-10 ENCOUNTER — Telehealth: Payer: Self-pay | Admitting: *Deleted

## 2018-05-10 DIAGNOSIS — G4733 Obstructive sleep apnea (adult) (pediatric): Secondary | ICD-10-CM

## 2018-05-10 NOTE — Telephone Encounter (Signed)
-----   Message from Troy Sine, MD sent at 05/07/2018  7:09 PM EDT ----- Cathy Bell,  Please notify pt and set up for CPAP titration, or CPAP Auto if not able to do in lab titration.

## 2018-05-10 NOTE — Telephone Encounter (Signed)
Patient returned a call and was informed of sleep study results and recommendations. She has agreed to proceed with the CPAP titration study. She has also expressed that she wants to have Dr Radford Pax to manage her OSA.

## 2018-05-10 NOTE — Telephone Encounter (Signed)
Left message to return a call to discuss HST results. 

## 2018-05-11 ENCOUNTER — Ambulatory Visit (INDEPENDENT_AMBULATORY_CARE_PROVIDER_SITE_OTHER): Payer: BLUE CROSS/BLUE SHIELD | Admitting: Cardiovascular Disease

## 2018-05-11 ENCOUNTER — Telehealth: Payer: Self-pay | Admitting: *Deleted

## 2018-05-11 ENCOUNTER — Encounter: Payer: Self-pay | Admitting: Cardiovascular Disease

## 2018-05-11 DIAGNOSIS — E7849 Other hyperlipidemia: Secondary | ICD-10-CM | POA: Diagnosis not present

## 2018-05-11 DIAGNOSIS — Z9861 Coronary angioplasty status: Secondary | ICD-10-CM

## 2018-05-11 DIAGNOSIS — I2510 Atherosclerotic heart disease of native coronary artery without angina pectoris: Secondary | ICD-10-CM

## 2018-05-11 NOTE — Telephone Encounter (Signed)
Staff message sent to [place]. [employer] denied CPAP titration. Approved APAP through [facility].

## 2018-05-11 NOTE — Telephone Encounter (Signed)
-----   Message from Lauralee Evener, [place] sent at 05/10/2018 10:49 AM EDT ----- CPAP titration. ( switch to TT)

## 2018-05-11 NOTE — Assessment & Plan Note (Signed)
History of hyper lipidemia on high-dose atorvastatin and Zetia with recent lipid profile performed 01/28/2018 revealing total cholesterol 121, LDL 57 and HDL 51.

## 2018-05-11 NOTE — Progress Notes (Signed)
05/11/2018 [name]   Jun 20, 1961  [id number]  Primary Physician Yong Channel Brayton Mars, MD Primary Cardiologist: Lorretta Harp MD FACP, [place], [place], [place]  HPI:  Cathy Bell is a 57 y.o.  moderately overweight divorced Caucasian femalewho I last saw in the office  04/20/2018. She presented on 09/26/2017 with chest pain while walking in the park. EKG showed inferior ST segment elevation with reciprocal anterior depression. I brought her to the Cath Lab on Saturday morning and opened up an occluded mid RCA with a synergy drug-eluting stent. She did have inferobasal hypokinesia which improved by 2D echo the following day. She did have an 80% mid LAD lesion with a subsequent negative Myoview stress test. Her major complaints are of fatigue. Her other problems include hyperlipidemia on high-dose statin therapy with mildly elevated liver function tests. She is on dual antiplatelet therapy with Brilinta. Since I saw her 2 months ago she had progressive chest pressure especially when walking up an incline.  A recent coronary CTA performed 03/26/2018 confirmed a moderate mid LAD lesion but unfortunately FFR could not be performed.  Recently progressive symptoms of decided to proceed with elective outpatient LAD intervention. I performed outpatient coronary intervention on her 04/22/2018 with a drug-eluting stent placed in the mid LAD.  I also dilated her diagonal branch ostium through the stent struts.  Her RCA stent was patent at that time.  Her symptoms are markedly improved.  Current Meds  Medication Sig  . acetaminophen (TYLENOL) 500 MG tablet Take 500-1,000 mg by mouth every 6 (six) hours as needed (for pain.).  Marland Kitchen aspirin 81 MG chewable tablet Chew 1 tablet (81 mg total) by mouth daily. (Patient taking differently: Chew 81 mg by mouth daily at 6 (six) AM. 0630)  . atorvastatin (LIPITOR) 80 MG tablet Take 1 tablet (80 mg total) by mouth daily at 6 PM. (Patient taking differently:  Take 80 mg by mouth daily at 6 PM. (1830))  . Camphor-Eucalyptus-Menthol (VICKS CASERO EX) Apply 1 application topically.  . Cholecalciferol (VITAMIN D3) 5000 units TABS Take 5,000 Units by mouth daily at 6 (six) AM. 0630  . CRANBERRY PO Take 3 capsules by mouth 3 (three) times daily as needed (for urinary tract infection symptoms.).  Marland Kitchen D-Mannose 500 MG CAPS Take 500 mg by mouth 3 (three) times daily as needed (for urinary tract infection symptoms.).  Marland Kitchen loratadine (CLARITIN) 10 MG tablet Take 10 mg by mouth daily as needed for allergies.  . nitroGLYCERIN (NITROSTAT) 0.4 MG SL tablet Place 1 tablet (0.4 mg total) under the tongue every 5 (five) minutes x 3 doses as needed for chest pain.  . Omega-3 Fatty Acids (SUPER OMEGA-3) 1000 MG CAPS Take 1,000 mg by mouth daily at 6 (six) AM. 0630  . Probiotic Product (PROBIOTIC ADVANCED PO) Take 1 capsule by mouth daily at 6 (six) AM. (0630) Suprema Dophilus  . sodium chloride (MURO 128) 5 % ophthalmic ointment Place 1 application into both eyes at bedtime.  . sodium chloride (MURO 128) 5 % ophthalmic solution Place 1 drop into both eyes 3 (three) times daily.   . ticagrelor (BRILINTA) 90 MG TABS tablet Take 1 tablet (90 mg total) by mouth 2 (two) times daily. (Patient taking differently: Take 90 mg by mouth 2 (two) times daily. 0630 & 1830)     Allergies  Allergen Reactions  . Cefprozil Hives and Other (See Comments)  . Cheese Hives  . Peanut-Containing Drug Products Hives  . Prednisolone  Caused dangerously high eye pressure around time of eye surgery  . Salmon [Fish Allergy] Hives  . Wellbutrin [Bupropion Hcl] Hives    Social History   Socioeconomic History  . Marital status: Divorced    Spouse name: Not on file  . Number of children: 2  . Years of education: Not on file  . Highest education level: Master's degree (e.g., MA, MS, MEng, MEd, MSW, MBA)  Occupational History  . Occupation: Optometrist  Social Needs  . Financial resource  strain: Not hard at all  . Food insecurity:    Worry: Never true    Inability: Not on file  . Transportation needs:    Medical: No    Non-medical: No  Tobacco Use  . Smoking status: Never Smoker  . Smokeless tobacco: Never Used  Substance and Sexual Activity  . Alcohol use: No  . Drug use: No  . Sexual activity: Not Currently  Lifestyle  . Physical activity:    Days per week: 3 days    Minutes per session: 30 min  . Stress: Not on file  Relationships  . Social connections:    Talks on phone: Not on file    Gets together: Not on file    Attends religious service: Not on file    Active member of club or organization: Not on file    Attends meetings of clubs or organizations: Not on file    Relationship status: Not on file  . Intimate partner violence:    Fear of current or ex partner: Not on file    Emotionally abused: Not on file    Physically abused: Not on file    Forced sexual activity: Not on file  Other Topics Concern  . Not on file  Social History Narrative   Divorced. 2 sons [name] 26 and [name]. No grandkids yet.       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 Graduate school at [employer]: walking, surface design- Financial planner, play mahjong, movies, reading, travel     Review of Systems: General: negative for chills, fever, night sweats or weight changes.  Cardiovascular: negative for chest pain, dyspnea on exertion, edema, orthopnea, palpitations, paroxysmal nocturnal dyspnea or shortness of breath Dermatological: negative for rash Respiratory: negative for cough or wheezing Urologic: negative for hematuria Abdominal: negative for nausea, vomiting, diarrhea, bright red blood per rectum, melena, or hematemesis Neurologic: negative for visual changes, syncope, or dizziness All other systems reviewed and are otherwise negative except  as noted above.    Blood pressure 92/64, pulse 83, height 5' 2.5" (1.588 m), weight 161 lb (73 kg), last menstrual period 03/18/2011.  General appearance: alert and no distress Neck: no adenopathy, no carotid bruit, no JVD, supple, symmetrical, trachea midline and thyroid not enlarged, symmetric, no tenderness/mass/nodules Lungs: clear to auscultation bilaterally Heart: regular rate and rhythm, S1, S2 normal, no murmur, click, rub or gallop Extremities: extremities normal, atraumatic, no cyanosis or edema Pulses: 2+ and symmetric Skin: Skin color, texture, turgor normal. No rashes or lesions Neurologic: Alert and oriented X 3, normal strength and tone. Normal symmetric reflexes. 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  She had residual mid LAD  disease involving diagonal branch and has been having exertional chest pain.  Because of this I recath her 04/22/2018 place a drug-eluting stent in her mid LAD dilating the ostium of the diagonal branch through the struts.  This resulted in marked improvement in her symptoms.  Familial hyperlipidemia History of hyper lipidemia on high-dose atorvastatin and Zetia with recent lipid profile performed 01/28/2018 revealing total cholesterol 121, LDL 57 and HDL 51.      Lorretta Harp MD FACP,FACC,FAHA, [facility] 05/11/2018 1:53 PM

## 2018-05-11 NOTE — Assessment & Plan Note (Signed)
History of CAD status post inferior STEMI 09/26/2017 with placement of a DES in the mid RCA.  She had residual mid LAD disease involving diagonal branch and has been having exertional chest pain.  Because of this I recath her 04/22/2018 place a drug-eluting stent in her mid LAD dilating the ostium of the diagonal branch through the struts.  This resulted in marked improvement in her symptoms.

## 2018-05-11 NOTE — Patient Instructions (Signed)
Medication Instructions:  Your physician recommends that you continue on your current medications as directed. Please refer to the Current Medication list given to you today.  If you need a refill on your cardiac medications before your next appointment, please call your pharmacy.   Lab work: none If you have labs (blood work) drawn today and your tests are completely normal, you will receive your results only by: Marland Kitchen MyChart Message (if you have MyChart) OR . A paper copy in the mail If you have any lab test that is abnormal or we need to change your treatment, we will call you to review the results.  Testing/Procedures: none  Follow-Up: At [facility], you and your health needs are our priority.  As part of our continuing mission to provide you with exceptional heart care, we have created designated Provider Care Teams.  These Care Teams include your primary Cardiologist (physician) and Advanced Practice Providers (APPs -  Physician Assistants and Nurse Practitioners) who all work together to provide you with the care you need, when you need it. You will need a follow up appointment in 6 months.  Please call our office 2 months in advance to schedule this appointment.  You may see Quay Burow, MD or one of the following Advanced Practice Providers on your designated Care Team:   Kerin Ransom, PA-C Roby Lofts, [place] . Sande Rives, PA-C  Any Other Special Instructions Will Be Listed Below (If Applicable).

## 2018-05-12 DIAGNOSIS — E785 Hyperlipidemia, unspecified: Secondary | ICD-10-CM | POA: Diagnosis not present

## 2018-05-12 LAB — LIPID PANEL
Chol/HDL Ratio: 2.3 ratio (ref 0.0–4.4)
Cholesterol, Total: 132 mg/dL (ref 100–199)
HDL: 58 mg/dL (ref >39)
LDL Calculated: 62 mg/dL (ref 0–99)
TRIGLYCERIDES: 60 mg/dL (ref 0–149)
VLDL CHOLESTEROL CAL: 12 mg/dL (ref 5–40)

## 2018-05-12 NOTE — Telephone Encounter (Signed)
  Lauralee Evener, CMA  Zakir Henner, [place] denied CPAP titration. Approved APAP to be provided by [facility]. Dr Claiborne Billings read sleep study but patient requests Dr Radford Pax. Approval faxed to your fax machine.

## 2018-05-17 ENCOUNTER — Telehealth: Payer: Self-pay | Admitting: *Deleted

## 2018-05-17 NOTE — Telephone Encounter (Signed)
-----   Message from Sueanne Margarita, MD sent at 05/12/2018  6:15 PM EDT ----- Regarding: RE: Orders Please order Airsense CPAP with heated humidity and mask of choice with 2 week autotitration from 5 to 18cm H2O.  Set up OV with me in 10 weeks  Fransico Him, MD ----- Message ----- From: Freada Bergeron, CMA Sent: 05/12/2018   3:52 PM EDT To: Sueanne Margarita, MD Subject: Orders                                         Please write orders for APAP. Thanks ----- Message ----- From: Lauralee Evener, CMA Sent: 05/11/2018   9:45 AM EDT To: Freada Bergeron, CMA  [employer] denied CPAP titration. Approved APAP to be provided by [facility]. Dr Claiborne Billings read sleep study but patient requests Dr Radford Pax. Approval faxed to your fax machine. ----- Message ----- From: Lauralee Evener, CMA Sent: 05/10/2018  10:49 AM EDT To: C[facility] Div Sleep Studies  CPAP titration. ( switch to TT)

## 2018-05-17 NOTE — Telephone Encounter (Signed)
APAP Orders faxed to [facility].

## 2018-05-20 ENCOUNTER — Encounter: Payer: Self-pay | Admitting: Internal Medicine

## 2018-05-20 ENCOUNTER — Ambulatory Visit (INDEPENDENT_AMBULATORY_CARE_PROVIDER_SITE_OTHER): Payer: BLUE CROSS/BLUE SHIELD | Admitting: Internal Medicine

## 2018-05-20 VITALS — BP 103/66 | HR 67 | Ht 62.5 in | Wt 160.4 lb

## 2018-05-20 DIAGNOSIS — I2510 Atherosclerotic heart disease of native coronary artery without angina pectoris: Secondary | ICD-10-CM

## 2018-05-20 DIAGNOSIS — E782 Mixed hyperlipidemia: Secondary | ICD-10-CM

## 2018-05-20 DIAGNOSIS — Z9861 Coronary angioplasty status: Secondary | ICD-10-CM | POA: Diagnosis not present

## 2018-05-20 NOTE — Progress Notes (Signed)
LIPID CLINIC NOTE  Chief Complaint:  Lipid management  Primary Care Physician: Marin Olp, MD  HPI:  Cathy Bell is a 57 y.o. female with a past medial history significant for coronary artery disease status post acute ST elevation MI of the inferior wall in March 2019.  Interestingly, she was not complaining of chest pain rather had a feeling of "dehydration in her throat" and abdominal upset and the feeling of impending diarrhea which is actually what led her to the hospital.  She had been having worsening symptoms for several months prior to that, after having eye surgery for rare eye condition.  She had attributed to that.  She reports she has a lot of GI symptoms and tends to run low blood pressure with frequent presyncopal episodes suggesting high vagal tone.  She is referred to me today for lipid management.  She is the first known member of her family to have early onset coronary artery disease however both of her parents have pacemakers and advanced age.  Her father had abdominal aortic aneurysm stent in his 37s.  She does have 4 sisters, however one is adopted.  She knows that 2 sisters have had cholesterol testing indicating both of their cholesterols were 270 and 290 respectively.  Her cholesterol total was also 290 initially with LDL of 187 at the time of her STEMI.  She was appropriately placed on high intensity statin therapy with atorvastatin 80 mg daily which she is tolerating.  This is because marked reduction in LDL is expected with a good response to 91 based on recent direct LDL, however goal LDL based on current guidelines is less than 70.  Currently she is asymptomatic, denying any chest pain or worsening shortness of breath.  She reports a fairly healthy diet in fact is been on a low carbohydrate diet due to a history of gestational diabetes with her children.  She does have 2 sons in their 83s.  1 of the 2 has had a stroke in his early 43s and was found to have a  PFO.  The other son also has a PFO.  She knows that one son has normal cholesterol but the other has not had testing.  05/20/2018  Cathy Bell returns today for follow-up of dyslipidemia.  Unfortunately she proceeded to have some persistent chest pain in cardiac rehabilitation.  She had had prior ST elevation MI earlier this year and ultimately underwent repeat heart catheterization a few weeks ago and had PCI and stenting to the ostial diagonal lesion which was 80% stenosed.  Her previously placed proximal to mid RCA stent was patent.  We have seen a marked improvement in her dyslipidemia on therapy.  Most recently her total cholesterol is 132, triglycerides 60, HDL 58 and LDL of 62.  This is on therapy with ezetimibe and high intensity atorvastatin 80 mg.  At this point she appears to be at goal LDL and hopefully this will manifest in decreased cardiovascular events.  I do not see indications for additional therapy with PCSK9 inhibitor at this time.  PMHx:  Past Medical History:  Diagnosis Date  . Acute ST elevation myocardial infarction (STEMI) of inferior wall ([place]) 09/26/2017   Pt presented 09/26/17 with an acute inferior STEMI. Door to balloon time 30 minutes.   . Coronary artery disease   . Heart disease   . History of Epstein-Barr virus infection   . Hyperlipidemia   . Sleep apnea 2007   uses c pap on  occasion    Past Surgical History:  Procedure Laterality Date  . CARDIAC CATHETERIZATION    . CORONARY ANGIOPLASTY WITH STENT PLACEMENT  04/22/2018  . CORONARY STENT INTERVENTION N/A 04/22/2018   Procedure: CORONARY STENT INTERVENTION;  Surgeon: Lorretta Harp, MD;  Location: [facility];  Service: Cardiovascular;  Laterality: N/A;  . CORONARY/GRAFT ACUTE MI REVASCULARIZATION N/A 09/26/2017   Procedure: Coronary/Graft Acute MI Revascularization;  Surgeon: Lorretta Harp, MD;  Location: [facility];  Service: Cardiovascular;  Laterality: N/A;  . LEFT HEART CATH AND  CORONARY ANGIOGRAPHY N/A 09/26/2017   Procedure: LEFT HEART CATH AND CORONARY ANGIOGRAPHY;  Surgeon: Lorretta Harp, MD;  Location: [facility];  Service: Cardiovascular;  Laterality: N/A;  . nodulectomy     Salzmann Nodular Degeneration  . STENT PLACEMENT VASCULAR ([place] [facility])  2019  . TONSILLECTOMY AND ADENOIDECTOMY  1967  . WISDOM TOOTH EXTRACTION  1981    FAMHx:  Family History  Problem Relation Age of Onset  . Colon cancer Father        healthy until 7s then developed  . Atrial fibrillation Father        pacemaker  . Hyperlipidemia Mother   . Other Mother        pacemaker. she is very short stature. healthy until 73s  . Breast cancer Sister        stage 0   . Hashimoto's thyroiditis Sister   . Stroke Son        due to PFO  . Diabetes Maternal Grandfather     SOCHx:   reports that she has never smoked. She has never used smokeless tobacco. She reports that she does not drink alcohol or use drugs.  ALLERGIES:  Allergies  Allergen Reactions  . Cefprozil Hives and Other (See Comments)  . Cheese Hives  . Peanut-Containing Drug Products Hives  . Prednisolone     Caused dangerously high eye pressure around time of eye surgery  . Salmon [Fish Allergy] Hives  . Wellbutrin [Bupropion Hcl] Hives    ROS: Pertinent items noted in HPI and remainder of comprehensive ROS otherwise negative.  HOME MEDS: Current Outpatient Medications on File Prior to Visit  Medication Sig Dispense Refill  . acetaminophen (TYLENOL) 500 MG tablet Take 500-1,000 mg by mouth every 6 (six) hours as needed (for pain.).    Marland Kitchen aspirin 81 MG chewable tablet Chew 1 tablet (81 mg total) by mouth daily. (Patient taking differently: Chew 81 mg by mouth daily at 6 (six) AM. 0630) 90 tablet 3  . atorvastatin (LIPITOR) 80 MG tablet Take 1 tablet (80 mg total) by mouth daily at 6 PM. (Patient taking differently: Take 80 mg by mouth daily at 6 PM. (1830)) 90 tablet 3  . Camphor-Eucalyptus-Menthol (VICKS  CASERO EX) Apply 1 application topically.    . Cholecalciferol (VITAMIN D3) 5000 units TABS Take 5,000 Units by mouth daily at 6 (six) AM. 0630    . CRANBERRY PO Take 3 capsules by mouth 3 (three) times daily as needed (for urinary tract infection symptoms.).    Marland Kitchen D-Mannose 500 MG CAPS Take 500 mg by mouth 3 (three) times daily as needed (for urinary tract infection symptoms.).    Marland Kitchen ezetimibe (ZETIA) 10 MG tablet Take 1 tablet (10 mg total) by mouth daily. (Patient taking differently: Take 10 mg by mouth daily at 6 PM. 1830) 90 tablet 3  . loratadine (CLARITIN) 10 MG tablet Take 10 mg by mouth daily  as needed for allergies.    . nitroGLYCERIN (NITROSTAT) 0.4 MG SL tablet Place 1 tablet (0.4 mg total) under the tongue every 5 (five) minutes x 3 doses as needed for chest pain. 25 tablet 3  . Omega-3 Fatty Acids (SUPER OMEGA-3) 1000 MG CAPS Take 1,000 mg by mouth daily at 6 (six) AM. 0630    . Probiotic Product (PROBIOTIC ADVANCED PO) Take 1 capsule by mouth daily at 6 (six) AM. (0630) Suprema Dophilus    . sodium chloride (MURO 128) 5 % ophthalmic ointment Place 1 application into both eyes at bedtime.    . sodium chloride (MURO 128) 5 % ophthalmic solution Place 1 drop into both eyes 3 (three) times daily.     . ticagrelor (BRILINTA) 90 MG TABS tablet Take 1 tablet (90 mg total) by mouth 2 (two) times daily. (Patient taking differently: Take 90 mg by mouth 2 (two) times daily. 0630 & 1830) 180 tablet 3   No current facility-administered medications on file prior to visit.     LABS/IMAGING: No results found for this or any previous visit (from the past 48 hour(s)). No results found.  LIPID PANEL:    Component Value Date/Time   CHOL 132 05/12/2018 0822   TRIG 60 05/12/2018 0822   HDL 58 05/12/2018 0822   CHOLHDL 2.3 05/12/2018 0822   CHOLHDL 4.6 09/26/2017 2334   VLDL 12 09/26/2017 2334   LDLCALC 62 05/12/2018 0822   LDLDIRECT 91.0 11/03/2017 1451     WEIGHTS: Wt Readings from Last 3  Encounters:  05/20/18 160 lb 6.4 oz (72.8 kg)  05/11/18 161 lb (73 kg)  05/06/18 159 lb 6.4 oz (72.3 kg)    VITALS: BP 103/66   Pulse 67   Ht 5' 2.5" (1.588 m)   Wt 160 lb 6.4 oz (72.8 kg)   LMP 03/18/2011   BMI 28.87 kg/m   EXAM: Deferred  EKG: Deferred  ASSESSMENT: 1. Coronary artery disease with ST elevation MI status post PCI/DES to the mid RCA which was 100% occluded (09/2017) 2. Residual coronary artery disease with an 80% proximal LAD stenosis 3. Ischemic cardiomyopathy EF 45 to 50% (improved to 55 to 60% post PCI by echo-09/2017) 4. Low risk Myoview without significant reversible ischemia (10/2017) 5. Mixed dyslipidemia -possibly FH (genetic testing pending)  PLAN: 1.   Cathy Bell did have some symptoms of angina with cardiac rehabilitation and had known disease beyond her initial presentation with occlusion of the RCA.  Subsequently she underwent coronary intervention on October 10 to the diagonal lesion.  Since then she cannot necessarily tell a difference in any of her symptoms but has not been as active.  She has had a significant improvement in her dyslipidemia and I suspect does not have FH, however we have proceeded with genetic testing given her strong family history of elevated cholesterol in her sisters.  That genetic testing is pending and I will reach out to our geneticist for more information on those results.  We did ultimately receive prior authorization for this.  At this point I think it is not likely that she will need PCSK9 inhibitor therapy.  Plan continued medication compliance and strict dietary adherence to reduce cardiovascular risk.  Follow-up with Dr. Gwenlyn Found and me as needed.  Thanks for allowing me to participate in her care.  TIME SPENT WITH PATIENT: 25 minutes of direct patient care. More than 50% of that time was spent on coordination of care and counseling regarding dyslipidemia, recent stent  placement, genetic testing and familial screening of  dyslipidemia.  Pixie Casino, MD, [facility], [place] Director of the Advanced Lipid Disorders &  Cardiovascular Risk Reduction Clinic Diplomate of the American Board of Clinical Lipidology Attending Cardiologist  Direct Dial: [phone]  Fax: [phone]  Website:  [email or web address] Cathy Bell 05/20/2018, 8:23 AM

## 2018-05-20 NOTE — Patient Instructions (Signed)
Medication Instructions:  Continue current medications If you need a refill on your cardiac medications before your next appointment, please call your pharmacy.   Lab work: FASTING lab work in 6 months to check cholesterol If you have labs (blood work) drawn today and your tests are completely normal, you will receive your results only by: Marland Kitchen MyChart Message (if you have MyChart) OR . A paper copy in the mail If you have any lab test that is abnormal or we need to change your treatment, we will call you to review the results.   Follow-Up as needed with Dr. Debara Pickett for lipid clinic

## 2018-05-24 NOTE — Progress Notes (Unsigned)
This encounter was created in error - please disregard.

## 2018-05-25 ENCOUNTER — Telehealth (HOSPITAL_COMMUNITY): Payer: Self-pay

## 2018-05-27 ENCOUNTER — Ambulatory Visit: Payer: BLUE CROSS/BLUE SHIELD | Admitting: Genetic Counselor

## 2018-05-28 NOTE — Telephone Encounter (Addendum)
Patient has a 10 week follow up appointment scheduled for 07/22/18 at 3 pm. Patient understands she needs to keep this appointment for insurance compliance. Patient was grateful for the call and thanked me.

## 2018-06-04 NOTE — Progress Notes (Signed)
Post Test Genetic Consultation Notes Encounter Date: 05/27/18  Cathy Bell is here today to discuss the test results of her genetic testing for FH. I explained to her that her insurance company allowed genetic testing for 2 of the 3 major genes implicated in [place]. This is because their medical policy does not recognize full gene sequencing of the APOB gene as medically necessary. I informed her that genetic mutation in APOB account for 2%-5% of FH mutations. Patients harboring pathogenic variants in this gene are reported to have less severe clinical presentation as compared to those with LDLR gene mutations.   I reviewed her family pedigree and she notes that there have been no changes in the medical history of her family members. However, she informs me that she had to undergo a second surgery on April 22, 2018 for a coronary artery that was reported to be 70% blocked in March of this year. She tells me the events that lead up to this event. She states that she did not perform very well at her cardiac rehab appointment in July and was asked to obtain clearance from her doctor to continue cardiac rehab. She subsequently underwent a sleep study where she was found to have low to mid range values. She states that she resume traveling for work and mentions feeling distinctly out of breath, while walking [place] the airports. Upon arrival she underwent a CT contrast and surgery was scheduled. However, the next day she reports having difficulty breathing and thought she was having a heart attack. An ambulance was summoned and she underwent surgery to clear the block.   She states that she feels much better now and participates in the "[place]" events sponsored by [employer] 5 days a week.  I inform her that her genetic test for FH that included only two genes, namely LDLR and PCSK9 did not identify a pathogenic variant. I would recommend that she pursue genetic testing for APOB so as to have a  complete and accurate test report. I explained to her that this gene can be tested at a later time upon the discretion of her physician if her LDL-C levels are not well controlled. I also explained that in the absence of a pathogenic variant, her sisters will not need genetic testing at this time. She verbalized understanding of this.    Cathy Bell, Ph.D, [facility] Clinical Molecular Geneticist

## 2018-06-08 DIAGNOSIS — G4733 Obstructive sleep apnea (adult) (pediatric): Secondary | ICD-10-CM | POA: Diagnosis not present

## 2018-06-16 ENCOUNTER — Telehealth (HOSPITAL_COMMUNITY): Payer: Self-pay

## 2018-06-16 NOTE — Telephone Encounter (Signed)
Cardiac Rehab Medication Review by a Pharmacist  Does the patient  feel that his/her medications are working for him/her?  yes  Has the patient been experiencing any side effects to the medications prescribed?  no  Does the patient measure his/her own blood pressure or blood glucose at home?  no   Does the patient have any problems obtaining medications due to transportation or finances?   no  Understanding of regimen: good Understanding of indications: good Potential of compliance: good  Pharmacist comments: None  Vertis Kelch, PharmD PGY1 Pharmacy Resident Phone [phone] 06/16/2018       10:19 AM

## 2018-06-18 NOTE — Progress Notes (Signed)
Cathy Bell 57 y.o. female DOB Nov 04, 1960 MRN [id number]       Nutrition  No diagnosis found. Past Medical History:  Diagnosis Date  . Acute ST elevation myocardial infarction (STEMI) of inferior wall ([place]) 09/26/2017   Pt presented 09/26/17 with an acute inferior STEMI. Door to balloon time 30 minutes.   . Coronary artery disease   . Heart disease   . History of Epstein-Barr virus infection   . Hyperlipidemia   . Sleep apnea 2007   uses c pap on occasion   Meds reviewed.     Current Outpatient Medications (Cardiovascular):  .  atorvastatin (LIPITOR) 80 MG tablet, Take 1 tablet (80 mg total) by mouth daily at 6 PM. (Patient taking differently: Take 80 mg by mouth daily at 6 PM. (1830)) .  ezetimibe (ZETIA) 10 MG tablet, Take 1 tablet (10 mg total) by mouth daily. (Patient taking differently: Take 10 mg by mouth daily at 6 PM. 1830) .  nitroGLYCERIN (NITROSTAT) 0.4 MG SL tablet, Place 1 tablet (0.4 mg total) under the tongue every 5 (five) minutes x 3 doses as needed for chest pain.  Current Outpatient Medications (Respiratory):  Marland Kitchen  Camphor-Eucalyptus-Menthol (VICKS CASERO EX), Apply 1 application topically. Marland Kitchen  loratadine (CLARITIN) 10 MG tablet, Take 10 mg by mouth daily as needed for allergies.  Current Outpatient Medications (Analgesics):  .  acetaminophen (TYLENOL) 500 MG tablet, Take 500-1,000 mg by mouth every 6 (six) hours as needed (for pain.). Marland Kitchen  aspirin 81 MG chewable tablet, Chew 1 tablet (81 mg total) by mouth daily. (Patient taking differently: Chew 81 mg by mouth daily at 6 (six) AM. 0630)  Current Outpatient Medications (Hematological):  .  ticagrelor (BRILINTA) 90 MG TABS tablet, Take 1 tablet (90 mg total) by mouth 2 (two) times daily. (Patient taking differently: Take 90 mg by mouth 2 (two) times daily. 0630 & 1830)  Current Outpatient Medications (Other):  Marland Kitchen  Cholecalciferol (VITAMIN D3) 5000 units TABS, Take 5,000 Units by mouth 3 (three) times a week. 0630   .  CRANBERRY PO, Take 3 capsules by mouth 3 (three) times daily as needed (for urinary tract infection symptoms.). Marland Kitchen  D-Mannose 500 MG CAPS, Take 500 mg by mouth 3 (three) times daily as needed (for urinary tract infection symptoms.). Marland Kitchen  Omega-3 Fatty Acids (SUPER OMEGA-3) 1000 MG CAPS, Take 1,000 mg by mouth 2 (two) times daily. 0630  .  Probiotic Product (PROBIOTIC ADVANCED PO), Take 1 capsule by mouth daily at 6 (six) AM. (0630) Suprema Dophilus .  sodium chloride (MURO 128) 5 % ophthalmic ointment, Place 1 application into both eyes at bedtime. .  sodium chloride (MURO 128) 5 % ophthalmic solution, Place 1 drop into both eyes 3 (three) times daily.    HT: Ht Readings from Last 1 Encounters:  05/20/18 5' 2.5" (1.588 m)    WT: Wt Readings from Last 5 Encounters:  05/20/18 160 lb 6.4 oz (72.8 kg)  05/11/18 161 lb (73 kg)  05/06/18 159 lb 6.4 oz (72.3 kg)  04/23/18 160 lb 15 oz (73 kg)  04/20/18 160 lb (72.6 kg)     BMI=28.85 05/20/18  Current tobacco use? No       Labs:  Lipid Panel     Component Value Date/Time   CHOL 132 05/12/2018 0822   TRIG 60 05/12/2018 0822   HDL 58 05/12/2018 0822   CHOLHDL 2.3 05/12/2018 0822   CHOLHDL 4.6 09/26/2017 2334   VLDL 12 09/26/2017 2334  LDLCALC 62 05/12/2018 0822   LDLDIRECT 91.0 11/03/2017 1451    Lab Results  Component Value Date   HGBA1C 5.6 09/26/2017   CBG (last 3)  No results for input(s): GLUCAP in the last 72 hours.  Nutrition Diagnosis ? Food-and nutrition-related knowledge deficit related to lack of exposure to information as related to diagnosis of: ? CVD  ? Overweight  related to excessive energy intake as evidenced by a BMI=28.85   Nutrition Goal(s):  ? To be determined  Plan:  Pt to attend nutrition classes ? Nutrition I ? Nutrition II ? Portion Distortion  ? Diabetes Blitz ? Diabetes Q & A Will provide client-centered nutrition education as part of interdisciplinary care.   Monitor and evaluate progress  toward nutrition goal with team.  Laurina Bustle, MS, RD, LDN 06/18/2018 2:05 PM

## 2018-06-24 ENCOUNTER — Encounter (HOSPITAL_COMMUNITY)
Admission: RE | Admit: 2018-06-24 | Discharge: 2018-06-24 | Disposition: A | Discharge status: Home / Self Care | Payer: BLUE CROSS/BLUE SHIELD | Source: Ambulatory Visit | Attending: Cardiovascular Disease | Admitting: Cardiovascular Disease

## 2018-06-24 VITALS — Ht 64.0 in | Wt 163.4 lb

## 2018-06-24 DIAGNOSIS — Z79899 Other long term (current) drug therapy: Secondary | ICD-10-CM | POA: Insufficient documentation

## 2018-06-24 DIAGNOSIS — I2510 Atherosclerotic heart disease of native coronary artery without angina pectoris: Secondary | ICD-10-CM | POA: Insufficient documentation

## 2018-06-24 DIAGNOSIS — Z7902 Long term (current) use of antithrombotics/antiplatelets: Secondary | ICD-10-CM | POA: Insufficient documentation

## 2018-06-24 DIAGNOSIS — Z955 Presence of coronary angioplasty implant and graft: Secondary | ICD-10-CM | POA: Diagnosis not present

## 2018-06-24 DIAGNOSIS — E785 Hyperlipidemia, unspecified: Secondary | ICD-10-CM | POA: Diagnosis not present

## 2018-06-24 DIAGNOSIS — Z7982 Long term (current) use of aspirin: Secondary | ICD-10-CM | POA: Insufficient documentation

## 2018-06-24 DIAGNOSIS — I252 Old myocardial infarction: Secondary | ICD-10-CM | POA: Insufficient documentation

## 2018-06-24 DIAGNOSIS — G4730 Sleep apnea, unspecified: Secondary | ICD-10-CM | POA: Insufficient documentation

## 2018-06-24 NOTE — Progress Notes (Signed)
Cardiac Individual Treatment Plan  Patient Details  Name: Cathy Bell MRN: [id number] Date of Birth: [birth year] Referring Provider:     CARDIAC REHAB PHASE II ORIENTATION from 06/24/2018 in [place]  Referring Provider  Jeanann Lewandowsky MD       Initial Encounter Date:    CARDIAC REHAB PHASE II ORIENTATION from 06/24/2018 in [place]  Date  06/24/18      Visit Diagnosis: Status post coronary artery stent placement  Patient's Home Medications on Admission:  Current Outpatient Medications:  .  acetaminophen (TYLENOL) 500 MG tablet, Take 500-1,000 mg by mouth every 6 (six) hours as needed (for pain.)., Disp: , Rfl:  .  aspirin 81 MG chewable tablet, Chew 1 tablet (81 mg total) by mouth daily. (Patient taking differently: Chew 81 mg by mouth daily at 6 (six) AM. 0630), Disp: 90 tablet, Rfl: 3 .  atorvastatin (LIPITOR) 80 MG tablet, Take 1 tablet (80 mg total) by mouth daily at 6 PM. (Patient taking differently: Take 80 mg by mouth daily at 6 PM. (1830)), Disp: 90 tablet, Rfl: 3 .  Camphor-Eucalyptus-Menthol (VICKS CASERO EX), Apply 1 application topically., Disp: , Rfl:  .  Cholecalciferol (VITAMIN D3) 5000 units TABS, Take 5,000 Units by mouth 3 (three) times a week. 0630 , Disp: , Rfl:  .  CRANBERRY PO, Take 3 capsules by mouth 3 (three) times daily as needed (for urinary tract infection symptoms.)., Disp: , Rfl:  .  D-Mannose 500 MG CAPS, Take 500 mg by mouth 3 (three) times daily as needed (for urinary tract infection symptoms.)., Disp: , Rfl:  .  ezetimibe (ZETIA) 10 MG tablet, Take 1 tablet (10 mg total) by mouth daily. (Patient taking differently: Take 10 mg by mouth daily at 6 PM. 1830), Disp: 90 tablet, Rfl: 3 .  loratadine (CLARITIN) 10 MG tablet, Take 10 mg by mouth daily as needed for allergies., Disp: , Rfl:  .  nitroGLYCERIN (NITROSTAT) 0.4 MG SL tablet, Place 1 tablet (0.4 mg total) under the tongue  every 5 (five) minutes x 3 doses as needed for chest pain., Disp: 25 tablet, Rfl: 3 .  Omega-3 Fatty Acids (SUPER OMEGA-3) 1000 MG CAPS, Take 1,000 mg by mouth 2 (two) times daily. 0630 , Disp: , Rfl:  .  Probiotic Product (PROBIOTIC ADVANCED PO), Take 1 capsule by mouth daily at 6 (six) AM. (0630) Suprema Dophilus, Disp: , Rfl:  .  sodium chloride (MURO 128) 5 % ophthalmic ointment, Place 1 application into both eyes at bedtime., Disp: , Rfl:  .  sodium chloride (MURO 128) 5 % ophthalmic solution, Place 1 drop into both eyes 3 (three) times daily. , Disp: , Rfl:  .  ticagrelor (BRILINTA) 90 MG TABS tablet, Take 1 tablet (90 mg total) by mouth 2 (two) times daily. (Patient taking differently: Take 90 mg by mouth 2 (two) times daily. 0630 & 1830), Disp: 180 tablet, Rfl: 3  Past Medical History: Past Medical History:  Diagnosis Date  . Acute ST elevation myocardial infarction (STEMI) of inferior wall ([place]) 09/26/2017   Pt presented 09/26/17 with an acute inferior STEMI. Door to balloon time 30 minutes.   . Coronary artery disease   . Heart disease   . History of Epstein-Barr virus infection   . Hyperlipidemia   . Sleep apnea 2007   uses c pap on occasion    Tobacco Use: Social History   Tobacco Use  Smoking Status Never  Smoker  Smokeless Tobacco Never Used    Labs: Recent Review Flowsheet Data    Labs for ITP Cardiac and Pulmonary Rehab Latest Ref Rng & Units 09/26/2017 09/26/2017 11/03/2017 01/28/2018 05/12/2018   Cholestrol 100 - 199 mg/dL - 240(H) - 121 132   LDLCALC 0 - 99 mg/dL - 176(H) - 57 62   LDLDIRECT mg/dL - - 91.0 - -   HDL >39 mg/dL - 52 - 51 58   Trlycerides 0 - 149 mg/dL - 61 - 64 60   Hemoglobin A1c 4.8 - 5.6 % 5.6 - - - -   TCO2 22 - 32 mmol/L - - - - -      Capillary Blood Glucose: No results found for: GLUCAP   Exercise Target Goals: Exercise Program Goal: Individual exercise prescription set using results from initial 6 min walk test and THRR while  considering  patient's activity barriers and safety.   Exercise Prescription Goal: Initial exercise prescription builds to 30-45 minutes a day of aerobic activity, 2-3 days per week.  Home exercise guidelines will be given to patient during program as part of exercise prescription that the participant will acknowledge.  Activity Barriers & Risk Stratification: Activity Barriers & Cardiac Risk Stratification - 06/24/18 1110      Activity Barriers & Cardiac Risk Stratification   Activity Barriers  None    Cardiac Risk Stratification  Moderate       6 Minute Walk: 6 Minute Walk    Row Name 06/24/18 1100 06/24/18 1126       6 Minute Walk   Phase  Initial  Initial    Distance  -  1800 feet    Walk Time  -  6 minutes    # of Rest Breaks  -  0    MPH  -  3.41    METS  -  4.34    RPE  -  12    Perceived Dyspnea   -  0    VO2 Peak  -  15.18    Symptoms  -  No    Resting HR  -  84 bpm    Resting BP  -  112/60    Resting Oxygen Saturation   -  97 %    Exercise Oxygen Saturation  during 6 min walk  -  100 %    Max Ex. HR  -  107 bpm    Max Ex. BP  -  122/64    2 Minute Post BP  -  102/72       Oxygen Initial Assessment:   Oxygen Re-Evaluation:   Oxygen Discharge (Final Oxygen Re-Evaluation):   Initial Exercise Prescription: Initial Exercise Prescription - 06/24/18 1100      Date of Initial Exercise RX and Referring Provider   Date  06/24/18    Referring Provider  Jeanann Lewandowsky MD     Expected Discharge Date  09/29/18      Treadmill   MPH  3    Grade  1    Minutes  10    METs  3.71      Recumbant Bike   Level  2.5   SciFit Bike   Watts  40    Minutes  10    METs  3.89      NuStep   Level  2    SPM  75    Minutes  10    METs  3  Prescription Details   Frequency (times per week)  3x    Duration  Progress to 30 minutes of continuous aerobic without signs/symptoms of physical distress      Intensity   THRR 40-80% of Max Heartrate  65-130     Ratings of Perceived Exertion  11-13    Perceived Dyspnea  0-4      Progression   Progression  Continue to progress workloads to maintain intensity without signs/symptoms of physical distress.      Resistance Training   Training Prescription  Yes    Weight  4lb    Reps  10-15       Perform Capillary Blood Glucose checks as needed.  Exercise Prescription Changes:   Exercise Comments:   Exercise Goals and Review: Exercise Goals    Row Name 06/24/18 1110             Exercise Goals   Increase Physical Activity  Yes       Intervention  Provide advice, education, support and counseling about physical activity/exercise needs.;Develop an individualized exercise prescription for aerobic and resistive training based on initial evaluation findings, risk stratification, comorbidities and participant's personal goals.       Expected Outcomes  Short Term: Attend rehab on a regular basis to increase amount of physical activity.;Long Term: Add in home exercise to make exercise part of routine and to increase amount of physical activity.;Long Term: Exercising regularly at least 3-5 days a week.       Able to understand and use rate of perceived exertion (RPE) scale  Yes       Intervention  Provide education and explanation on how to use RPE scale       Expected Outcomes  Short Term: Able to use RPE daily in rehab to express subjective intensity level;Long Term:  Able to use RPE to guide intensity level when exercising independently       Knowledge and understanding of Target Heart Rate Range (THRR)  Yes       Intervention  Provide education and explanation of THRR including how the numbers were predicted and where they are located for reference       Expected Outcomes  Short Term: Able to state/look up THRR;Long Term: Able to use THRR to govern intensity when exercising independently;Short Term: Able to use daily as guideline for intensity in rehab       Able to check pulse independently  Yes        Intervention  Review the importance of being able to check your own pulse for safety during independent exercise;Provide education and demonstration on how to check pulse in carotid and radial arteries.       Expected Outcomes  Short Term: Able to explain why pulse checking is important during independent exercise;Long Term: Able to check pulse independently and accurately       Understanding of Exercise Prescription  Yes       Intervention  Provide education, explanation, and written materials on patient's individual exercise prescription       Expected Outcomes  Short Term: Able to explain program exercise prescription;Long Term: Able to explain home exercise prescription to exercise independently          Exercise Goals Re-Evaluation :   Discharge Exercise Prescription (Final Exercise Prescription Changes):   Nutrition:  Target Goals: Understanding of nutrition guidelines, daily intake of sodium 1500mg , cholesterol 200mg , calories 30% from fat and 7% or less from saturated fats, daily to have 5  or more servings of fruits and vegetables.  Biometrics: Pre Biometrics - 06/24/18 1109      Pre Biometrics   Height  5\' 4"  (1.626 m)    Weight  74.1 kg    Waist Circumference  35 inches    Hip Circumference  42.5 inches    Waist to Hip Ratio  0.82 %    BMI (Calculated)  28.03    Triceps Skinfold  30 mm    % Body Fat  38.8 %    Grip Strength  29 kg    Flexibility  13.5 in    Single Leg Stand  30 seconds        Nutrition Therapy Plan and Nutrition Goals:   Nutrition Assessments:   Nutrition Goals Re-Evaluation:   Nutrition Goals Re-Evaluation:   Nutrition Goals Discharge (Final Nutrition Goals Re-Evaluation):   Psychosocial: Target Goals: Acknowledge presence or absence of significant depression and/or stress, maximize coping skills, provide positive support system. Participant is able to verbalize types and ability to use techniques and skills needed for reducing  stress and depression.  Initial Review & Psychosocial Screening: Initial Psych Review & Screening - 06/24/18 1202      Initial Review   Current issues with  None Identified      Family Dynamics   Good Support System?  Yes   Pt lists her family and friends as sources of support.      Barriers   Psychosocial barriers to participate in program  There are no identifiable barriers or psychosocial needs.      Screening Interventions   Interventions  Encouraged to exercise       Quality of Life Scores: Quality of Life - 06/24/18 1112      Quality of Life   Select  Quality of Life      Quality of Life Scores   Health/Function Pre  22 %    Socioeconomic Pre  21.56 %    Psych/Spiritual Pre  21.25 %    Family Pre  21.6 %    GLOBAL Pre  21.71 %      Scores of 19 and below usually indicate a poorer quality of life in these areas.  A difference of  2-3 points is a clinically meaningful difference.  A difference of 2-3 points in the total score of the Quality of Life Index has been associated with significant improvement in overall quality of life, self-image, physical symptoms, and general health in studies assessing change in quality of life.  PHQ-9: Recent Review Flowsheet Data    Depression screen [facility] 2/9 01/18/2018 11/03/2017   Decreased Interest 0 0   Down, Depressed, Hopeless 0 0   PHQ - 2 Score 0 0     Interpretation of Total Score  Total Score Depression Severity:  1-4 = Minimal depression, 5-9 = Mild depression, 10-14 = Moderate depression, 15-19 = Moderately severe depression, 20-27 = Severe depression   Psychosocial Evaluation and Intervention:   Psychosocial Re-Evaluation:   Psychosocial Discharge (Final Psychosocial Re-Evaluation):   Vocational Rehabilitation: Provide vocational rehab assistance to qualifying candidates.   Vocational Rehab Evaluation & Intervention: Vocational Rehab - 06/24/18 1205      Initial Vocational Rehab Evaluation & Intervention    Assessment shows need for Vocational Rehabilitation  No       Education: Education Goals: Education classes will be provided on a weekly basis, covering required topics. Participant will state understanding/return demonstration of topics presented.  Learning Barriers/Preferences: Learning Barriers/Preferences - 06/24/18  1111      Learning Barriers/Preferences   Learning Barriers  Sight    Learning Preferences  Verbal Instruction;Written Material       Education Topics: Count Your Pulse:  -Group instruction provided by verbal instruction, demonstration, patient participation and written materials to support subject.  Instructors address importance of being able to find your pulse and how to count your pulse when at home without a heart monitor.  Patients get hands on experience counting their pulse with staff help and individually.   Heart Attack, Angina, and Risk Factor Modification:  -Group instruction provided by verbal instruction, video, and written materials to support subject.  Instructors address signs and symptoms of angina and heart attacks.    Also discuss risk factors for heart disease and how to make changes to improve heart health risk factors.   Functional Fitness:  -Group instruction provided by verbal instruction, demonstration, patient participation, and written materials to support subject.  Instructors address safety measures for doing things around the house.  Discuss how to get up and down off the floor, how to pick things up properly, how to safely get out of a chair without assistance, and balance training.   Meditation and Mindfulness:  -Group instruction provided by verbal instruction, patient participation, and written materials to support subject.  Instructor addresses importance of mindfulness and meditation practice to help reduce stress and improve awareness.  Instructor also leads participants through a meditation exercise.    Stretching for Flexibility  and Mobility:  -Group instruction provided by verbal instruction, patient participation, and written materials to support subject.  Instructors lead participants through series of stretches that are designed to increase flexibility thus improving mobility.  These stretches are additional exercise for major muscle groups that are typically performed during regular warm up and cool down.   Hands Only CPR:  -Group verbal, video, and participation provides a basic overview of AHA guidelines for community CPR. Role-play of emergencies allow participants the opportunity to practice calling for help and chest compression technique with discussion of AED use.   Hypertension: -Group verbal and written instruction that provides a basic overview of hypertension including the most recent diagnostic guidelines, risk factor reduction with self-care instructions and medication management.    Nutrition I class: Heart Healthy Eating:  -Group instruction provided by PowerPoint slides, verbal discussion, and written materials to support subject matter. The instructor gives an explanation and review of the Therapeutic Lifestyle Changes diet recommendations, which includes a discussion on lipid goals, dietary fat, sodium, fiber, plant stanol/sterol esters, sugar, and the components of a well-balanced, healthy diet.   Nutrition II class: Lifestyle Skills:  -Group instruction provided by PowerPoint slides, verbal discussion, and written materials to support subject matter. The instructor gives an explanation and review of label reading, grocery shopping for heart health, heart healthy recipe modifications, and ways to make healthier choices when eating out.   Diabetes Question & Answer:  -Group instruction provided by PowerPoint slides, verbal discussion, and written materials to support subject matter. The instructor gives an explanation and review of diabetes co-morbidities, pre- and post-prandial blood glucose  goals, pre-exercise blood glucose goals, signs, symptoms, and treatment of hypoglycemia and hyperglycemia, and foot care basics.   Diabetes Blitz:  -Group instruction provided by PowerPoint slides, verbal discussion, and written materials to support subject matter. The instructor gives an explanation and review of the physiology behind type 1 and type 2 diabetes, diabetes medications and rational behind using different medications, pre- and post-prandial blood  glucose recommendations and Hemoglobin A1c goals, diabetes diet, and exercise including blood glucose guidelines for exercising safely.    Portion Distortion:  -Group instruction provided by PowerPoint slides, verbal discussion, written materials, and food models to support subject matter. The instructor gives an explanation of serving size versus portion size, changes in portions sizes over the last 20 years, and what consists of a serving from each food group.   Stress Management:  -Group instruction provided by verbal instruction, video, and written materials to support subject matter.  Instructors review role of stress in heart disease and how to cope with stress positively.     Exercising on Your Own:  -Group instruction provided by verbal instruction, power point, and written materials to support subject.  Instructors discuss benefits of exercise, components of exercise, frequency and intensity of exercise, and end points for exercise.  Also discuss use of nitroglycerin and activating EMS.  Review options of places to exercise outside of rehab.  Review guidelines for sex with heart disease.   Cardiac Drugs I:  -Group instruction provided by verbal instruction and written materials to support subject.  Instructor reviews cardiac drug classes: antiplatelets, anticoagulants, beta blockers, and statins.  Instructor discusses reasons, side effects, and lifestyle considerations for each drug class.   Cardiac Drugs II:  -Group  instruction provided by verbal instruction and written materials to support subject.  Instructor reviews cardiac drug classes: angiotensin converting enzyme inhibitors (ACE-I), angiotensin II receptor blockers (ARBs), nitrates, and calcium channel blockers.  Instructor discusses reasons, side effects, and lifestyle considerations for each drug class.   Anatomy and Physiology of the Circulatory System:  Group verbal and written instruction and models provide basic cardiac anatomy and physiology, with the coronary electrical and arterial systems. Review of: AMI, Angina, Valve disease, Heart Failure, Peripheral Artery Disease, Cardiac Arrhythmia, Pacemakers, and the ICD.   Other Education:  -Group or individual verbal, written, or video instructions that support the educational goals of the cardiac rehab program.   Holiday Eating Survival Tips:  -Group instruction provided by PowerPoint slides, verbal discussion, and written materials to support subject matter. The instructor gives patients tips, tricks, and techniques to help them not only survive but enjoy the holidays despite the onslaught of food that accompanies the holidays.   Knowledge Questionnaire Score: Knowledge Questionnaire Score - 06/24/18 1111      Knowledge Questionnaire Score   Pre Score  23/24       Core Components/Risk Factors/Patient Goals at Admission: Personal Goals and Risk Factors at Admission - 06/24/18 1111      Core Components/Risk Factors/Patient Goals on Admission    Weight Management  Yes;Weight Maintenance;Weight Loss    Intervention  Weight Management: Develop a combined nutrition and exercise program designed to reach desired caloric intake, while maintaining appropriate intake of nutrient and fiber, sodium and fats, and appropriate energy expenditure required for the weight goal.;Weight Management: Provide education and appropriate resources to help participant work on and attain dietary goals.    Admit  Weight  163 lb 5.8 oz (74.1 kg)    Goal Weight: Long Term  153 lb (69.4 kg)    Expected Outcomes  Short Term: Continue to assess and modify interventions until short term weight is achieved;Long Term: Adherence to nutrition and physical activity/exercise program aimed toward attainment of established weight goal;Weight Maintenance: Understanding of the daily nutrition guidelines, which includes 25-35% calories from fat, 7% or less cal from saturated fats, less than 200mg  cholesterol, less than 1.5gm of sodium, &  5 or more servings of fruits and vegetables daily;Understanding recommendations for meals to include 15-35% energy as protein, 25-35% energy from fat, 35-60% energy from carbohydrates, less than 200mg  of dietary cholesterol, 20-35 gm of total fiber daily;Weight Loss: Understanding of general recommendations for a balanced deficit meal plan, which promotes 1-2 lb weight loss per week and includes a negative energy balance of [phone] kcal/d;Understanding of distribution of calorie intake throughout the day with the consumption of 4-5 meals/snacks    Intervention  Provide education and support for participant on nutrition & aerobic/resistive exercise along with prescribed medications to achieve LDL 70mg , HDL >40mg .    Expected Outcomes  Short Term: Participant states understanding of desired cholesterol values and is compliant with medications prescribed. Participant is following exercise prescription and nutrition guidelines.;Long Term: Cholesterol controlled with medications as prescribed, with individualized exercise RX and with personalized nutrition plan. Value goals: LDL < 70mg , HDL > 40 mg.       Core Components/Risk Factors/Patient Goals Review:    Core Components/Risk Factors/Patient Goals at Discharge (Final Review):    ITP Comments: ITP Comments    Row Name 06/24/18 1059           ITP Comments  Dr. Fransico Him, Medical Director          Comments: Patient attended  orientation from [id number] to 0945 to review rules and guidelines for program. Completed 6 minute walk test, Intitial ITP, and exercise prescription.  VSS. Telemetry-SR.  Asymptomatic.

## 2018-06-27 ENCOUNTER — Encounter (HOSPITAL_BASED_OUTPATIENT_CLINIC_OR_DEPARTMENT_OTHER): Payer: BLUE CROSS/BLUE SHIELD

## 2018-06-28 ENCOUNTER — Encounter (HOSPITAL_COMMUNITY)
Admission: RE | Admit: 2018-06-28 | Discharge: 2018-06-28 | Disposition: A | Discharge status: Home / Self Care | Payer: BLUE CROSS/BLUE SHIELD | Source: Ambulatory Visit | Attending: Cardiovascular Disease | Admitting: Cardiovascular Disease

## 2018-06-28 ENCOUNTER — Ambulatory Visit (HOSPITAL_COMMUNITY): Payer: BLUE CROSS/BLUE SHIELD

## 2018-06-28 DIAGNOSIS — Z79899 Other long term (current) drug therapy: Secondary | ICD-10-CM | POA: Diagnosis not present

## 2018-06-28 DIAGNOSIS — Z955 Presence of coronary angioplasty implant and graft: Secondary | ICD-10-CM | POA: Diagnosis not present

## 2018-06-28 DIAGNOSIS — Z7982 Long term (current) use of aspirin: Secondary | ICD-10-CM | POA: Diagnosis not present

## 2018-06-28 DIAGNOSIS — G4730 Sleep apnea, unspecified: Secondary | ICD-10-CM | POA: Diagnosis not present

## 2018-06-28 DIAGNOSIS — Z7902 Long term (current) use of antithrombotics/antiplatelets: Secondary | ICD-10-CM | POA: Diagnosis not present

## 2018-06-28 DIAGNOSIS — E785 Hyperlipidemia, unspecified: Secondary | ICD-10-CM | POA: Diagnosis not present

## 2018-06-28 DIAGNOSIS — I2510 Atherosclerotic heart disease of native coronary artery without angina pectoris: Secondary | ICD-10-CM | POA: Diagnosis not present

## 2018-06-28 DIAGNOSIS — I252 Old myocardial infarction: Secondary | ICD-10-CM | POA: Diagnosis not present

## 2018-06-28 NOTE — Progress Notes (Signed)
Daily Session Note  Patient Details  Name: Cathy Bell MRN: [id number] Date of Birth: 30-Aug-1960 Referring Provider:     CARDIAC REHAB PHASE II ORIENTATION from 06/24/2018 in [facility]  Referring Provider  Cathy Lewandowsky MD       Encounter Date: 06/28/2018  Check In: Session Check In - 06/28/18 1158      Check-In   Supervising physician immediately available to respond to emergencies  [facility] immediately available    Physician(s)  Dr. Lonny Prude    Location  [facility]    Staff Present  Deitra Mayo, BS, ACSM CEP, Exercise Physiologist;Tyara Carol Ada, MS,ACSM CEP, Exercise Physiologist; , RN, Deland Pretty, MS, ACSM CEP, Exercise Physiologist;Dalton Kris Mouton, MS, Exercise Physiologist    Medication changes reported      No    Fall or balance concerns reported     No    Tobacco Cessation  No Change    Warm-up and Cool-down  Performed as group-led instruction    Resistance Training Performed  Yes    VAD Patient?  No    PAD/SET Patient?  No      Pain Assessment   Currently in Pain?  No/denies       Capillary Blood Glucose: No results found for this or any previous visit (from the past 24 hour(s)).  Exercise Prescription Changes - 06/28/18 1200      Response to Exercise   Blood Pressure (Admit)  120/60    Blood Pressure (Exercise)  122/62    Blood Pressure (Exit)  98/70    Heart Rate (Admit)  77 bpm    Heart Rate (Exercise)  110 bpm    Heart Rate (Exit)  76 bpm    Rating of Perceived Exertion (Exercise)  13    Perceived Dyspnea (Exercise)  0    Symptoms  None    Comments  Pt oriented to exercise equipment    Duration  Progress to 30 minutes of  aerobic without signs/symptoms of physical distress    Intensity  THRR unchanged      Progression   Progression  Continue to progress workloads to maintain intensity without signs/symptoms of physical distress.    Average METs  3.5      Resistance Training   Training Prescription  No      Treadmill   MPH  3    Grade  1    Minutes  10    METs  3.71      Recumbant Bike   Level  2.5    Watts  40    Minutes  10    METs  4.1      NuStep   Level  3    SPM  85    Minutes  10    METs  2.7       Social History   Tobacco Use  Smoking Status Never Smoker  Smokeless Tobacco Never Used    Goals Met:  Exercise tolerated well  Goals Unmet:  Not Applicable  Comments: Cathy Bell started cardiac rehab today.  Pt tolerated light exercise without difficulty. VSS, telemetry-Sinus Rhythm downward QRS, asymptomatic.  Medication list reconciled. Pt denies barriers to medicaiton compliance.  PSYCHOSOCIAL ASSESSMENT:  PHQ-. Pt exhibits positive coping skills, hopeful outlook with supportive family. No psychosocial needs identified at this time, no psychosocial interventions necessary.    Pt enjoys sewing.   Pt oriented to exercise equipment and routine.    Understanding verbalized.Cathy Bell  Cathy Maxon, RN,BSN 06/28/2018 4:48 PM   Dr. Fransico Him is Medical Director for Cardiac Rehab at [facility].

## 2018-06-29 ENCOUNTER — Other Ambulatory Visit: Payer: Self-pay | Admitting: Obstetrics and Gynecology

## 2018-06-29 ENCOUNTER — Ambulatory Visit
Admission: RE | Admit: 2018-06-29 | Discharge: 2018-06-29 | Disposition: A | Discharge status: Home / Self Care | Payer: BLUE CROSS/BLUE SHIELD | Source: Ambulatory Visit | Attending: Obstetrics and Gynecology | Admitting: Obstetrics and Gynecology

## 2018-06-29 DIAGNOSIS — Z1231 Encounter for screening mammogram for malignant neoplasm of breast: Secondary | ICD-10-CM

## 2018-06-30 ENCOUNTER — Encounter (HOSPITAL_COMMUNITY)
Admission: RE | Admit: 2018-06-30 | Discharge: 2018-06-30 | Disposition: A | Discharge status: Home / Self Care | Payer: BLUE CROSS/BLUE SHIELD | Source: Ambulatory Visit | Attending: Cardiovascular Disease | Admitting: Cardiovascular Disease

## 2018-06-30 ENCOUNTER — Ambulatory Visit (HOSPITAL_COMMUNITY): Payer: BLUE CROSS/BLUE SHIELD

## 2018-06-30 DIAGNOSIS — Z955 Presence of coronary angioplasty implant and graft: Secondary | ICD-10-CM

## 2018-06-30 DIAGNOSIS — I213 ST elevation (STEMI) myocardial infarction of unspecified site: Secondary | ICD-10-CM

## 2018-06-30 DIAGNOSIS — Z7982 Long term (current) use of aspirin: Secondary | ICD-10-CM | POA: Diagnosis not present

## 2018-06-30 DIAGNOSIS — I2510 Atherosclerotic heart disease of native coronary artery without angina pectoris: Secondary | ICD-10-CM | POA: Diagnosis not present

## 2018-06-30 DIAGNOSIS — Z7902 Long term (current) use of antithrombotics/antiplatelets: Secondary | ICD-10-CM | POA: Diagnosis not present

## 2018-06-30 DIAGNOSIS — E785 Hyperlipidemia, unspecified: Secondary | ICD-10-CM | POA: Diagnosis not present

## 2018-06-30 DIAGNOSIS — Z79899 Other long term (current) drug therapy: Secondary | ICD-10-CM | POA: Diagnosis not present

## 2018-06-30 DIAGNOSIS — I252 Old myocardial infarction: Secondary | ICD-10-CM | POA: Diagnosis not present

## 2018-06-30 DIAGNOSIS — G4730 Sleep apnea, unspecified: Secondary | ICD-10-CM | POA: Diagnosis not present

## 2018-07-02 ENCOUNTER — Encounter (HOSPITAL_COMMUNITY): Payer: BLUE CROSS/BLUE SHIELD

## 2018-07-02 ENCOUNTER — Ambulatory Visit (HOSPITAL_COMMUNITY): Payer: BLUE CROSS/BLUE SHIELD

## 2018-07-05 ENCOUNTER — Encounter (HOSPITAL_COMMUNITY): Payer: BLUE CROSS/BLUE SHIELD

## 2018-07-05 ENCOUNTER — Ambulatory Visit (HOSPITAL_COMMUNITY): Payer: BLUE CROSS/BLUE SHIELD

## 2018-07-08 DIAGNOSIS — G4733 Obstructive sleep apnea (adult) (pediatric): Secondary | ICD-10-CM | POA: Diagnosis not present

## 2018-07-09 ENCOUNTER — Encounter (HOSPITAL_COMMUNITY)
Admission: RE | Admit: 2018-07-09 | Discharge: 2018-07-09 | Disposition: A | Discharge status: Home / Self Care | Payer: BLUE CROSS/BLUE SHIELD | Source: Ambulatory Visit | Attending: Cardiovascular Disease | Admitting: Cardiovascular Disease

## 2018-07-09 ENCOUNTER — Ambulatory Visit (HOSPITAL_COMMUNITY): Payer: BLUE CROSS/BLUE SHIELD

## 2018-07-09 DIAGNOSIS — Z7982 Long term (current) use of aspirin: Secondary | ICD-10-CM | POA: Diagnosis not present

## 2018-07-09 DIAGNOSIS — Z7902 Long term (current) use of antithrombotics/antiplatelets: Secondary | ICD-10-CM | POA: Diagnosis not present

## 2018-07-09 DIAGNOSIS — I213 ST elevation (STEMI) myocardial infarction of unspecified site: Secondary | ICD-10-CM

## 2018-07-09 DIAGNOSIS — Z955 Presence of coronary angioplasty implant and graft: Secondary | ICD-10-CM

## 2018-07-09 DIAGNOSIS — E785 Hyperlipidemia, unspecified: Secondary | ICD-10-CM | POA: Diagnosis not present

## 2018-07-09 DIAGNOSIS — Z79899 Other long term (current) drug therapy: Secondary | ICD-10-CM | POA: Diagnosis not present

## 2018-07-09 DIAGNOSIS — I2510 Atherosclerotic heart disease of native coronary artery without angina pectoris: Secondary | ICD-10-CM | POA: Diagnosis not present

## 2018-07-09 DIAGNOSIS — I252 Old myocardial infarction: Secondary | ICD-10-CM | POA: Diagnosis not present

## 2018-07-09 DIAGNOSIS — G4730 Sleep apnea, unspecified: Secondary | ICD-10-CM | POA: Diagnosis not present

## 2018-07-12 ENCOUNTER — Ambulatory Visit (HOSPITAL_COMMUNITY): Payer: BLUE CROSS/BLUE SHIELD

## 2018-07-12 ENCOUNTER — Encounter (HOSPITAL_COMMUNITY)
Admission: RE | Admit: 2018-07-12 | Discharge: 2018-07-12 | Disposition: A | Discharge status: Home / Self Care | Payer: BLUE CROSS/BLUE SHIELD | Source: Ambulatory Visit | Attending: Cardiovascular Disease | Admitting: Cardiovascular Disease

## 2018-07-12 DIAGNOSIS — Z7902 Long term (current) use of antithrombotics/antiplatelets: Secondary | ICD-10-CM | POA: Diagnosis not present

## 2018-07-12 DIAGNOSIS — Z79899 Other long term (current) drug therapy: Secondary | ICD-10-CM | POA: Diagnosis not present

## 2018-07-12 DIAGNOSIS — I252 Old myocardial infarction: Secondary | ICD-10-CM | POA: Diagnosis not present

## 2018-07-12 DIAGNOSIS — G4730 Sleep apnea, unspecified: Secondary | ICD-10-CM | POA: Diagnosis not present

## 2018-07-12 DIAGNOSIS — Z955 Presence of coronary angioplasty implant and graft: Secondary | ICD-10-CM | POA: Diagnosis not present

## 2018-07-12 DIAGNOSIS — I2510 Atherosclerotic heart disease of native coronary artery without angina pectoris: Secondary | ICD-10-CM | POA: Diagnosis not present

## 2018-07-12 DIAGNOSIS — I213 ST elevation (STEMI) myocardial infarction of unspecified site: Secondary | ICD-10-CM

## 2018-07-12 DIAGNOSIS — Z7982 Long term (current) use of aspirin: Secondary | ICD-10-CM | POA: Diagnosis not present

## 2018-07-12 DIAGNOSIS — E785 Hyperlipidemia, unspecified: Secondary | ICD-10-CM | POA: Diagnosis not present

## 2018-07-16 ENCOUNTER — Encounter (HOSPITAL_COMMUNITY)
Admission: RE | Admit: 2018-07-16 | Discharge: 2018-07-16 | Disposition: A | Discharge status: Home / Self Care | Payer: BLUE CROSS/BLUE SHIELD | Source: Ambulatory Visit | Attending: Cardiovascular Disease | Admitting: Cardiovascular Disease

## 2018-07-16 ENCOUNTER — Ambulatory Visit (HOSPITAL_COMMUNITY): Payer: BLUE CROSS/BLUE SHIELD

## 2018-07-16 DIAGNOSIS — I2510 Atherosclerotic heart disease of native coronary artery without angina pectoris: Secondary | ICD-10-CM | POA: Insufficient documentation

## 2018-07-16 DIAGNOSIS — G4730 Sleep apnea, unspecified: Secondary | ICD-10-CM | POA: Diagnosis not present

## 2018-07-16 DIAGNOSIS — E785 Hyperlipidemia, unspecified: Secondary | ICD-10-CM | POA: Insufficient documentation

## 2018-07-16 DIAGNOSIS — Z7982 Long term (current) use of aspirin: Secondary | ICD-10-CM | POA: Diagnosis not present

## 2018-07-16 DIAGNOSIS — Z955 Presence of coronary angioplasty implant and graft: Secondary | ICD-10-CM

## 2018-07-16 DIAGNOSIS — Z7902 Long term (current) use of antithrombotics/antiplatelets: Secondary | ICD-10-CM | POA: Insufficient documentation

## 2018-07-16 DIAGNOSIS — Z79899 Other long term (current) drug therapy: Secondary | ICD-10-CM | POA: Diagnosis not present

## 2018-07-16 DIAGNOSIS — I252 Old myocardial infarction: Secondary | ICD-10-CM | POA: Insufficient documentation

## 2018-07-16 DIAGNOSIS — I213 ST elevation (STEMI) myocardial infarction of unspecified site: Secondary | ICD-10-CM

## 2018-07-19 ENCOUNTER — Ambulatory Visit (HOSPITAL_COMMUNITY): Payer: BLUE CROSS/BLUE SHIELD

## 2018-07-19 ENCOUNTER — Encounter (HOSPITAL_COMMUNITY): Payer: BLUE CROSS/BLUE SHIELD

## 2018-07-21 ENCOUNTER — Encounter (HOSPITAL_COMMUNITY): Payer: BLUE CROSS/BLUE SHIELD

## 2018-07-21 ENCOUNTER — Ambulatory Visit (HOSPITAL_COMMUNITY): Payer: BLUE CROSS/BLUE SHIELD

## 2018-07-22 ENCOUNTER — Ambulatory Visit: Payer: BLUE CROSS/BLUE SHIELD | Admitting: Cardiology

## 2018-07-23 ENCOUNTER — Ambulatory Visit (HOSPITAL_COMMUNITY): Payer: BLUE CROSS/BLUE SHIELD

## 2018-07-23 ENCOUNTER — Encounter (HOSPITAL_COMMUNITY): Payer: BLUE CROSS/BLUE SHIELD

## 2018-07-26 ENCOUNTER — Encounter (HOSPITAL_COMMUNITY): Payer: BLUE CROSS/BLUE SHIELD

## 2018-07-26 ENCOUNTER — Ambulatory Visit (HOSPITAL_COMMUNITY): Payer: BLUE CROSS/BLUE SHIELD

## 2018-07-28 ENCOUNTER — Ambulatory Visit (HOSPITAL_COMMUNITY): Payer: BLUE CROSS/BLUE SHIELD

## 2018-07-28 ENCOUNTER — Encounter (HOSPITAL_COMMUNITY): Payer: BLUE CROSS/BLUE SHIELD

## 2018-07-30 ENCOUNTER — Ambulatory Visit (HOSPITAL_COMMUNITY): Payer: BLUE CROSS/BLUE SHIELD

## 2018-07-30 ENCOUNTER — Encounter (HOSPITAL_COMMUNITY): Payer: BLUE CROSS/BLUE SHIELD

## 2018-08-02 ENCOUNTER — Encounter (HOSPITAL_COMMUNITY): Payer: BLUE CROSS/BLUE SHIELD

## 2018-08-02 ENCOUNTER — Ambulatory Visit (HOSPITAL_COMMUNITY): Payer: BLUE CROSS/BLUE SHIELD

## 2018-08-04 ENCOUNTER — Encounter (HOSPITAL_COMMUNITY): Payer: BLUE CROSS/BLUE SHIELD

## 2018-08-04 ENCOUNTER — Ambulatory Visit (HOSPITAL_COMMUNITY): Payer: BLUE CROSS/BLUE SHIELD

## 2018-08-06 ENCOUNTER — Ambulatory Visit (HOSPITAL_COMMUNITY): Payer: BLUE CROSS/BLUE SHIELD

## 2018-08-06 ENCOUNTER — Encounter (HOSPITAL_COMMUNITY): Payer: BLUE CROSS/BLUE SHIELD

## 2018-08-08 DIAGNOSIS — G4733 Obstructive sleep apnea (adult) (pediatric): Secondary | ICD-10-CM | POA: Diagnosis not present

## 2018-08-09 ENCOUNTER — Encounter (HOSPITAL_COMMUNITY): Payer: BLUE CROSS/BLUE SHIELD

## 2018-08-09 ENCOUNTER — Ambulatory Visit (HOSPITAL_COMMUNITY): Payer: BLUE CROSS/BLUE SHIELD

## 2018-08-09 NOTE — Addendum Note (Signed)
Encounter addended by: Noel Christmas, RN on: 08/09/2018 10:54 AM  Actions taken: Clinical Note Signed, Episode resolved

## 2018-08-09 NOTE — Progress Notes (Signed)
Discharge Progress Report  Patient Details  Name: Cathy Bell MRN: [id number] Date of Birth: 12-17-1960 Referring Provider:     [facility] from 06/24/2018 in [place]  Referring Provider  Jeanann Lewandowsky MD        Number of Visits: 6  Reason for Discharge:  Early Exit:  Insurance  Smoking History:  Social History   Tobacco Use  Smoking Status Never Smoker  Smokeless Tobacco Never Used    Diagnosis:  ST elevation myocardial infarction (STEMI), unspecified artery ([place]) 09/26/17  Stented coronary artery s/p DES RCA 09/26/17  ADL [facility]:   Initial Exercise Prescription: Initial Exercise Prescription - 06/24/18 1100      Date of Initial Exercise RX and Referring Provider   Date  06/24/18    Referring Provider  Jeanann Lewandowsky MD     Expected Discharge Date  09/29/18      Treadmill   MPH  3    Grade  1    Minutes  10    METs  3.71      Recumbant Bike   Level  2.5   SciFit Bike   Watts  40    Minutes  10    METs  3.89      NuStep   Level  2    SPM  75    Minutes  10    METs  3      Prescription Details   Frequency (times per week)  3x    Duration  Progress to 30 minutes of continuous aerobic without signs/symptoms of physical distress      Intensity   THRR 40-80% of Max Heartrate  65-130    Ratings of Perceived Exertion  11-13    Perceived Dyspnea  0-4      Progression   Progression  Continue to progress workloads to maintain intensity without signs/symptoms of physical distress.      Resistance Training   Training Prescription  Yes    Weight  4lb    Reps  10-15       Discharge Exercise Prescription (Final Exercise Prescription Changes): Exercise Prescription Changes - 07/16/18 1300      Response to Exercise   Blood Pressure (Admit)  110/60    Blood Pressure (Exercise)  110/60    Blood Pressure (Exit)  108/60    Heart Rate (Admit)  81 bpm    Heart Rate (Exercise)  120 bpm     Heart Rate (Exit)  81 bpm    Rating of Perceived Exertion (Exercise)  13    Perceived Dyspnea (Exercise)  0    Symptoms  None     Comments  Pt dropping from program due to copay increases    Duration  Continue with 30 min of aerobic exercise without signs/symptoms of physical distress.    Intensity  THRR unchanged      Progression   Progression  Continue to progress workloads to maintain intensity without signs/symptoms of physical distress.    Average METs  3.2      Resistance Training   Training Prescription  Yes    Weight  4lbs    Reps  10-15    Time  10 Minutes      Interval Training   Interval Training  No      Treadmill   MPH  3    Grade  1    Minutes  10    METs  3.71      Recumbant Bike   Level  2.5    Watts  40    Minutes  10    METs  2.5      NuStep   Level  3    SPM  95    Minutes  10    METs  3.1      Home Exercise Plan   Plans to continue exercise at  [employer] (comment)    Frequency  Add 3 additional days to program exercise sessions.    Initial Home Exercises Provided  07/16/18       Functional Capacity: 6 Minute Walk    Row Name 06/24/18 1100 06/24/18 1126       6 Minute Walk   Phase  Initial  Initial    Distance  -  1800 feet    Walk Time  -  6 minutes    # of Rest Breaks  -  0    MPH  -  3.41    METS  -  4.34    RPE  -  12    Perceived Dyspnea   -  0    VO2 Peak  -  15.18    Symptoms  -  No    Resting HR  -  84 bpm    Resting BP  -  112/60    Resting Oxygen Saturation   -  97 %    Exercise Oxygen Saturation  during 6 min walk  -  100 %    Max Ex. HR  -  107 bpm    Max Ex. BP  -  122/64    2 Minute Post BP  -  102/72       Psychological, QOL, Others - Outcomes: PHQ 2/9: Depression screen [facility] 2/9 08/06/2018 06/28/2018 01/18/2018 11/03/2017  Decreased Interest 0 0 0 0  Down, Depressed, Hopeless 0 - 0 0  PHQ - 2 Score 0 0 0 0    Quality of Life: Quality of Life - 06/24/18 1112      Quality of Life   Select   Quality of Life      Quality of Life Scores   Health/Function Pre  22 %    Socioeconomic Pre  21.56 %    Psych/Spiritual Pre  21.25 %    Family Pre  21.6 %    GLOBAL Pre  21.71 %       Personal Goals: Goals established at orientation with interventions provided to work toward goal. Personal Goals and Risk Factors at Admission - 06/24/18 1111      Core Components/Risk Factors/Patient Goals on Admission    Weight Management  Yes;Weight Maintenance;Weight Loss    Intervention  Weight Management: Develop a combined nutrition and exercise program designed to reach desired caloric intake, while maintaining appropriate intake of nutrient and fiber, sodium and fats, and appropriate energy expenditure required for the weight goal.;Weight Management: Provide education and appropriate resources to help participant work on and attain dietary goals.    Admit Weight  163 lb 5.8 oz (74.1 kg)    Goal Weight: Long Term  153 lb (69.4 kg)    Expected Outcomes  Short Term: Continue to assess and modify interventions until short term weight is achieved;Long Term: Adherence to nutrition and physical activity/exercise program aimed toward attainment of established weight goal;Weight Maintenance: Understanding of the daily nutrition guidelines, which includes 25-35% calories from fat, 7% or less cal from saturated  fats, less than 200mg  cholesterol, less than 1.5gm of sodium, & 5 or more servings of fruits and vegetables daily;Understanding recommendations for meals to include 15-35% energy as protein, 25-35% energy from fat, 35-60% energy from carbohydrates, less than 200mg  of dietary cholesterol, 20-35 gm of total fiber daily;Weight Loss: Understanding of general recommendations for a balanced deficit meal plan, which promotes 1-2 lb weight loss per week and includes a negative energy balance of [phone] kcal/d;Understanding of distribution of calorie intake throughout the day with the consumption of 4-5 meals/snacks     Intervention  Provide education and support for participant on nutrition & aerobic/resistive exercise along with prescribed medications to achieve LDL 70mg , HDL >40mg .    Expected Outcomes  Short Term: Participant states understanding of desired cholesterol values and is compliant with medications prescribed. Participant is following exercise prescription and nutrition guidelines.;Long Term: Cholesterol controlled with medications as prescribed, with individualized exercise RX and with personalized nutrition plan. Value goals: LDL < 70mg , HDL > 40 mg.        Personal Goals Discharge: Goals and Risk Factor Review    Row Name 08/06/18 0904             Core Components/Risk Factors/Patient Goals Review   Personal Goals Review  Weight Management/Obesity;Lipids       Review  Cathy Bell was discharged from the [facility] after completing 6 sessions.  She exited early d/t increased copay.        Expected Outcomes  Pt will continue to participate in exercise, nutrition, and lifestyle modification opportunities.  Cathy Bell is confident in her ability to exercise on her own.           Exercise Goals and Review: Exercise Goals    Row Name 06/24/18 1110             Exercise Goals   Increase Physical Activity  Yes       Intervention  Provide advice, education, support and counseling about physical activity/exercise needs.;Develop an individualized exercise prescription for aerobic and resistive training based on initial evaluation findings, risk stratification, comorbidities and participant's personal goals.       Expected Outcomes  Short Term: Attend rehab on a regular basis to increase amount of physical activity.;Long Term: Add in home exercise to make exercise part of routine and to increase amount of physical activity.;Long Term: Exercising regularly at least 3-5 days a week.       Able to understand and use rate of perceived exertion (RPE) scale  Yes       Intervention  Provide education and  explanation on how to use RPE scale       Expected Outcomes  Short Term: Able to use RPE daily in rehab to express subjective intensity level;Long Term:  Able to use RPE to guide intensity level when exercising independently       Knowledge and understanding of Target Heart Rate Range (THRR)  Yes       Intervention  Provide education and explanation of THRR including how the numbers were predicted and where they are located for reference       Expected Outcomes  Short Term: Able to state/look up THRR;Long Term: Able to use THRR to govern intensity when exercising independently;Short Term: Able to use daily as guideline for intensity in rehab       Able to check pulse independently  Yes       Intervention  Review the importance of being able to check your own pulse  for safety during independent exercise;Provide education and demonstration on how to check pulse in carotid and radial arteries.       Expected Outcomes  Short Term: Able to explain why pulse checking is important during independent exercise;Long Term: Able to check pulse independently and accurately       Understanding of Exercise Prescription  Yes       Intervention  Provide education, explanation, and written materials on patient's individual exercise prescription       Expected Outcomes  Short Term: Able to explain program exercise prescription;Long Term: Able to explain home exercise prescription to exercise independently          Exercise Goals Re-Evaluation: Exercise Goals Re-Evaluation    Row Name 07/16/18 1351             Exercise Goal Re-Evaluation   Exercise Goals Review  Increase Physical Activity;Understanding of Exercise Prescription       Comments  Pt completed 6 sessions of rehab. Reviewed home exercise plan with pt. Pt is exercising already and will continue home routine in place of rehab.        Expected Outcomes  Pt will continue to exercise most days of the week. Pt is currently doing yoga 2x a week, walking and  a group exercise class. Pt will continue to exercise most days of the week.           Nutrition & Weight - Outcomes: Pre Biometrics - 06/24/18 1109      Pre Biometrics   Height  5\' 4"  (1.626 m)    Weight  74.1 kg    Waist Circumference  35 inches    Hip Circumference  42.5 inches    Waist to Hip Ratio  0.82 %    BMI (Calculated)  28.03    Triceps Skinfold  30 mm    % Body Fat  38.8 %    Grip Strength  29 kg    Flexibility  13.5 in    Single Leg Stand  30 seconds        Nutrition: Nutrition Therapy & Goals - 08/05/18 1649      Nutrition Therapy   Diet  consistent carbohydrate, heart healthy      Personal Nutrition Goals   Nutrition Goal  pt to identify and limit food sources of saturated fat, trans fat, and sodium   unable to assess at pt did not return medficts survey   Personal Goal #2  pt to make good choices when eating out at restaurants   unable to assess at pt did not return medficts survey     Intervention Plan   Intervention  Prescribe, educate and counsel regarding individualized specific dietary modifications aiming towards targeted core components such as weight, hypertension, lipid management, diabetes, heart failure and other comorbidities.    Expected Outcomes  Short Term Goal: Understand basic principles of dietary content, such as calories, fat, sodium, cholesterol and nutrients.       Nutrition Discharge: Nutrition Assessments - 08/05/18 1650      MEDFICTS Scores   Pre Score  0    Post Score  --   did not return      Education Questionnaire Score: Knowledge Questionnaire Score - 06/24/18 1111      Knowledge Questionnaire Score   Pre Score  23/24       Goals reviewed with patient; copy given to patient.

## 2018-08-11 ENCOUNTER — Encounter (HOSPITAL_COMMUNITY): Payer: BLUE CROSS/BLUE SHIELD

## 2018-08-11 ENCOUNTER — Ambulatory Visit (HOSPITAL_COMMUNITY): Payer: BLUE CROSS/BLUE SHIELD

## 2018-08-13 ENCOUNTER — Encounter (HOSPITAL_COMMUNITY): Payer: BLUE CROSS/BLUE SHIELD

## 2018-08-13 ENCOUNTER — Ambulatory Visit (HOSPITAL_COMMUNITY): Payer: BLUE CROSS/BLUE SHIELD

## 2018-08-16 ENCOUNTER — Encounter (HOSPITAL_COMMUNITY): Payer: BLUE CROSS/BLUE SHIELD

## 2018-08-16 ENCOUNTER — Ambulatory Visit (HOSPITAL_COMMUNITY): Payer: BLUE CROSS/BLUE SHIELD

## 2018-08-18 ENCOUNTER — Encounter (HOSPITAL_COMMUNITY): Payer: BLUE CROSS/BLUE SHIELD

## 2018-08-18 ENCOUNTER — Ambulatory Visit (HOSPITAL_COMMUNITY): Payer: BLUE CROSS/BLUE SHIELD

## 2018-08-19 ENCOUNTER — Encounter: Payer: Self-pay | Admitting: Cardiology

## 2018-08-19 ENCOUNTER — Ambulatory Visit (INDEPENDENT_AMBULATORY_CARE_PROVIDER_SITE_OTHER): Payer: BLUE CROSS/BLUE SHIELD | Admitting: Cardiology

## 2018-08-19 DIAGNOSIS — G4733 Obstructive sleep apnea (adult) (pediatric): Secondary | ICD-10-CM

## 2018-08-19 HISTORY — DX: Obstructive sleep apnea (adult) (pediatric): G47.33

## 2018-08-19 NOTE — Progress Notes (Signed)
Cardiology Office Note:    Date:  08/19/2018   ID:  Cathy Bell, DOB 1960/10/16, MRN [id number]  PCP:  Marin Olp, MD  Cardiologist:  Quay Burow, MD    Referring MD: Marin Olp, MD   Chief Complaint  Patient presents with  . Sleep Apnea    History of Present Illness:    Cathy Bell is a 58 y.o. female with a hx of a history of CAD, hyperlipidemia who was referred for sleep testing by Beckie Busing, NP.  Study showed mild obstructive sleep apnea with an AHI of 10.6/h and oxygen desaturations as low as 88%.  She was placed on auto CPAP titration and is now back for follow-up.She is doing well with her CPAP device and thinks that she has gotten used to it.  She tolerates the mask and feels the pressure is adequate.  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  She has gotten around this by using an eye mask at night which is worked out fairly well.  Since going on CPAP she feels rested in the am and has no significant daytime sleepiness.  She denies any significant mouth or nasal dryness or nasal congestion.  She does not think that he snores.     Past Medical History:  Diagnosis Date  . Acute ST elevation myocardial infarction (STEMI) of inferior wall ([place]) 09/26/2017   Pt presented 09/26/17 with an acute inferior STEMI. Door to balloon time 30 minutes.   . Coronary artery disease   . Heart disease   . History of Epstein-Barr virus infection   . Hyperlipidemia   . OSA (obstructive sleep apnea) 08/19/2018    mild obstructive sleep apnea with an AHI of 10.6/h and oxygen desaturations as low as 88%.  . Sleep apnea 2007   uses c pap on occasion    Past Surgical History:  Procedure Laterality Date  . CARDIAC CATHETERIZATION    . CORONARY ANGIOPLASTY WITH STENT PLACEMENT  04/22/2018  . CORONARY STENT INTERVENTION N/A 04/22/2018   Procedure: CORONARY STENT INTERVENTION;  Surgeon: Lorretta Harp, MD;  Location: [place] [facility];  Service: Cardiovascular;  Laterality: N/A;  . CORONARY/GRAFT ACUTE MI REVASCULARIZATION N/A 09/26/2017   Procedure: Coronary/Graft Acute MI Revascularization;  Surgeon: Lorretta Harp, MD;  Location: [place] [facility];  Service: Cardiovascular;  Laterality: N/A;  . LEFT HEART CATH AND CORONARY ANGIOGRAPHY N/A 09/26/2017   Procedure: LEFT HEART CATH AND CORONARY ANGIOGRAPHY;  Surgeon: Lorretta Harp, MD;  Location: [place] [facility];  Service: Cardiovascular;  Laterality: N/A;  . nodulectomy     Salzmann Nodular Degeneration  . STENT PLACEMENT VASCULAR ([place] HX)  2019  . TONSILLECTOMY AND ADENOIDECTOMY  1967  . WISDOM TOOTH EXTRACTION  1981    Current Medications: Current Meds  Medication Sig  . acetaminophen (TYLENOL) 500 MG tablet Take 500-1,000 mg by mouth every 6 (six) hours as needed (for pain.).  Marland Kitchen aspirin 81 MG chewable tablet Chew 1 tablet (81 mg total) by mouth daily. (Patient taking differently: Chew 81 mg by mouth daily at 6 (six) AM. 0630)  . atorvastatin (LIPITOR) 80 MG tablet Take 1 tablet (80 mg total) by mouth daily at 6 PM. (Patient taking differently: Take 80 mg by mouth daily at 6 PM. (1830))  . Camphor-Eucalyptus-Menthol (VICKS CASERO EX) Apply 1 application topically.  . Cholecalciferol (VITAMIN D3) 5000 units TABS  Take 5,000 Units by mouth 3 (three) times a week. 0630   . CRANBERRY PO Take 3 capsules by mouth 3 (three) times daily as needed (for urinary tract infection symptoms.).  Marland Kitchen D-Mannose 500 MG CAPS Take 500 mg by mouth 3 (three) times daily as needed (for urinary tract infection symptoms.).  Marland Kitchen loratadine (CLARITIN) 10 MG tablet Take 10 mg by mouth daily as needed for allergies.  . nitroGLYCERIN (NITROSTAT) 0.4 MG SL tablet Place 1 tablet (0.4 mg total) under the tongue every 5 (five) minutes x 3 doses as needed for chest pain.  . Omega-3 Fatty Acids (SUPER OMEGA-3) 1000 MG CAPS Take 1,000 mg by mouth 2 (two)  times daily. 0630   . Probiotic Product (PROBIOTIC ADVANCED PO) Take 1 capsule by mouth daily at 6 (six) AM. (0630) Suprema Dophilus  . sodium chloride (MURO 128) 5 % ophthalmic ointment Place 1 application into both eyes at bedtime.  . sodium chloride (MURO 128) 5 % ophthalmic solution Place 1 drop into both eyes 3 (three) times daily.   . ticagrelor (BRILINTA) 90 MG TABS tablet Take 1 tablet (90 mg total) by mouth 2 (two) times daily. (Patient taking differently: Take 90 mg by mouth 2 (two) times daily. 0630 & 1830)     Allergies:   Cefprozil; Cheese; Peanut-containing drug products; Prednisolone; Salmon [fish allergy]; and Wellbutrin [bupropion hcl]   Social History   Socioeconomic History  . Marital status: Divorced    Spouse name: Not on file  . Number of children: 2  . Years of education: Not on file  . Highest education level: Master's degree (e.g., MA, MS, MEng, MEd, MSW, MBA)  Occupational History  . Occupation: Optometrist  Social Needs  . Financial resource strain: Not hard at all  . Food insecurity:    Worry: Never true    Inability: Not on file  . Transportation needs:    Medical: No    Non-medical: No  Tobacco Use  . Smoking status: Never Smoker  . Smokeless tobacco: Never Used  Substance and Sexual Activity  . Alcohol use: No  . Drug use: No  . Sexual activity: Not Currently  Lifestyle  . Physical activity:    Days per week: 3 days    Minutes per session: 30 min  . Stress: Not on file  Relationships  . Social connections:    Talks on phone: Not on file    Gets together: Not on file    Attends religious service: Not on file    Active member of club or organization: Not on file    Attends meetings of clubs or organizations: Not on file    Relationship status: Not on file  Other Topics Concern  . Not on file  Social History Narrative   Divorced. 2 sons [name] 26 and [name]. No grandkids yet.       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 Graduate school at [employer]: walking, surface design- Financial planner, play mahjong, movies, reading, travel     Family History: The patient's family history includes Atrial fibrillation in her father; Breast cancer in her sister; Colon cancer in her father; Diabetes in her maternal grandfather; Hashimoto's thyroiditis in her sister; Hyperlipidemia in her mother; Other in her mother; Stroke in her son.  ROS:   Please see the history of present illness.  ROS  All other systems reviewed and negative.   EKGs/Labs/Other Studies Reviewed:    The following studies were reviewed today: PAP download  EKG:  EKG is not ordered today.    Recent Labs: 01/28/2018: Magnesium 2.1 05/06/2018: ALT 36; BUN 20; Creatinine, Ser 0.82; Hemoglobin 12.9; Platelets 193.0; Potassium 4.6; Sodium 139; TSH 1.32   Recent Lipid Panel    Component Value Date/Time   CHOL 132 05/12/2018 0822   TRIG 60 05/12/2018 0822   HDL 58 05/12/2018 0822   CHOLHDL 2.3 05/12/2018 0822   CHOLHDL 4.6 09/26/2017 2334   VLDL 12 09/26/2017 2334   LDLCALC 62 05/12/2018 0822   LDLDIRECT 91.0 11/03/2017 1451    Physical Exam:    VS:  BP 94/60   Pulse 89   Ht 5\' 4"  (1.626 m)   Wt 167 lb 12.8 oz (76.1 kg)   LMP 03/18/2011   SpO2 97%   BMI 28.80 kg/m     Wt Readings from Last 3 Encounters:  08/19/18 167 lb 12.8 oz (76.1 kg)  06/24/18 163 lb 5.8 oz (74.1 kg)  05/20/18 160 lb 6.4 oz (72.8 kg)     GEN:  Well nourished, well developed in no acute distress HEENT: Normal NECK: No JVD; No carotid bruits LYMPHATICS: No lymphadenopathy CARDIAC: RRR, no murmurs, rubs, gallops RESPIRATORY:  Clear to auscultation without rales, wheezing or rhonchi  ABDOMEN: Soft, non-tender, non-distended MUSCULOSKELETAL:  No edema; No deformity  SKIN: Warm and dry NEUROLOGIC:  Alert and oriented x 3 PSYCHIATRIC:  Normal  affect   ASSESSMENT:    1. OSA (obstructive sleep apnea)    PLAN:    In order of problems listed above:  1.  OSA -sleep study showed mild obstructive sleep apnea with an AHI of 10.6/h and oxygen desaturations as low as 88%.  She was placed on a home dose CPAP auto titration and is now back for follow-up.  Her download today looks great with an AHI of 0.8 on auto CPAP from 5 to 18 cm H2O with 100% compliance.  The patient is tolerating PAP therapy well without any problems. The patient has been using and benefiting from PAP use and will continue to benefit from therapy.  She does travel a lot for her job and would like a travel CPAP so I will order a travel CPAP on auto from 5 to 18 cm H2O.   Medication Adjustments/Labs and Tests Ordered: Current medicines are reviewed at length with the patient today.  Concerns regarding medicines are outlined above.  No orders of the defined types were placed in this encounter.  No orders of the defined types were placed in this encounter.   Signed, Fransico Him, MD  08/19/2018 2:33 PM    [place]

## 2018-08-19 NOTE — Patient Instructions (Signed)

## 2018-08-20 ENCOUNTER — Encounter (HOSPITAL_COMMUNITY): Payer: BLUE CROSS/BLUE SHIELD

## 2018-08-20 ENCOUNTER — Ambulatory Visit (HOSPITAL_COMMUNITY): Payer: BLUE CROSS/BLUE SHIELD

## 2018-08-23 ENCOUNTER — Ambulatory Visit (HOSPITAL_COMMUNITY): Payer: BLUE CROSS/BLUE SHIELD

## 2018-08-23 ENCOUNTER — Encounter (HOSPITAL_COMMUNITY): Payer: BLUE CROSS/BLUE SHIELD

## 2018-08-25 ENCOUNTER — Ambulatory Visit (HOSPITAL_COMMUNITY): Payer: BLUE CROSS/BLUE SHIELD

## 2018-08-25 ENCOUNTER — Encounter (HOSPITAL_COMMUNITY): Payer: BLUE CROSS/BLUE SHIELD

## 2018-08-27 ENCOUNTER — Encounter (HOSPITAL_COMMUNITY): Payer: BLUE CROSS/BLUE SHIELD

## 2018-08-27 ENCOUNTER — Ambulatory Visit (HOSPITAL_COMMUNITY): Payer: BLUE CROSS/BLUE SHIELD

## 2018-08-30 ENCOUNTER — Encounter (HOSPITAL_COMMUNITY): Payer: BLUE CROSS/BLUE SHIELD

## 2018-08-30 ENCOUNTER — Ambulatory Visit (HOSPITAL_COMMUNITY): Payer: BLUE CROSS/BLUE SHIELD

## 2018-09-01 ENCOUNTER — Ambulatory Visit (HOSPITAL_COMMUNITY): Payer: BLUE CROSS/BLUE SHIELD

## 2018-09-01 ENCOUNTER — Encounter (HOSPITAL_COMMUNITY): Payer: BLUE CROSS/BLUE SHIELD

## 2018-09-03 ENCOUNTER — Encounter (HOSPITAL_COMMUNITY): Payer: BLUE CROSS/BLUE SHIELD

## 2018-09-03 ENCOUNTER — Ambulatory Visit (HOSPITAL_COMMUNITY): Payer: BLUE CROSS/BLUE SHIELD

## 2018-09-06 ENCOUNTER — Ambulatory Visit (HOSPITAL_COMMUNITY): Payer: BLUE CROSS/BLUE SHIELD

## 2018-09-06 ENCOUNTER — Encounter (HOSPITAL_COMMUNITY): Payer: BLUE CROSS/BLUE SHIELD

## 2018-09-08 ENCOUNTER — Ambulatory Visit (HOSPITAL_COMMUNITY): Payer: BLUE CROSS/BLUE SHIELD

## 2018-09-08 ENCOUNTER — Other Ambulatory Visit: Payer: Self-pay

## 2018-09-08 ENCOUNTER — Encounter (HOSPITAL_COMMUNITY): Payer: BLUE CROSS/BLUE SHIELD

## 2018-09-08 DIAGNOSIS — G4733 Obstructive sleep apnea (adult) (pediatric): Secondary | ICD-10-CM | POA: Diagnosis not present

## 2018-09-08 MED ORDER — ATORVASTATIN CALCIUM 80 MG PO TABS: 80.0000 mg | ORAL_TABLET | Freq: Every day | ORAL | 3 refills | Status: DC

## 2018-09-09 ENCOUNTER — Ambulatory Visit: Payer: BLUE CROSS/BLUE SHIELD | Admitting: Cardiology

## 2018-09-10 ENCOUNTER — Ambulatory Visit (HOSPITAL_COMMUNITY): Payer: BLUE CROSS/BLUE SHIELD

## 2018-09-10 ENCOUNTER — Encounter (HOSPITAL_COMMUNITY): Payer: BLUE CROSS/BLUE SHIELD

## 2018-09-13 ENCOUNTER — Ambulatory Visit (HOSPITAL_COMMUNITY): Payer: BLUE CROSS/BLUE SHIELD

## 2018-09-13 ENCOUNTER — Encounter (HOSPITAL_COMMUNITY): Payer: BLUE CROSS/BLUE SHIELD

## 2018-09-15 ENCOUNTER — Ambulatory Visit (HOSPITAL_COMMUNITY): Payer: BLUE CROSS/BLUE SHIELD

## 2018-09-15 ENCOUNTER — Encounter (HOSPITAL_COMMUNITY): Payer: BLUE CROSS/BLUE SHIELD

## 2018-09-17 ENCOUNTER — Encounter (HOSPITAL_COMMUNITY): Payer: BLUE CROSS/BLUE SHIELD

## 2018-09-17 ENCOUNTER — Ambulatory Visit (HOSPITAL_COMMUNITY): Payer: BLUE CROSS/BLUE SHIELD

## 2018-09-20 ENCOUNTER — Ambulatory Visit (HOSPITAL_COMMUNITY): Payer: BLUE CROSS/BLUE SHIELD

## 2018-09-20 ENCOUNTER — Encounter (HOSPITAL_COMMUNITY): Payer: BLUE CROSS/BLUE SHIELD

## 2018-09-22 ENCOUNTER — Encounter (HOSPITAL_COMMUNITY): Payer: BLUE CROSS/BLUE SHIELD

## 2018-09-22 ENCOUNTER — Ambulatory Visit (HOSPITAL_COMMUNITY): Payer: BLUE CROSS/BLUE SHIELD

## 2018-09-24 ENCOUNTER — Ambulatory Visit (HOSPITAL_COMMUNITY): Payer: BLUE CROSS/BLUE SHIELD

## 2018-09-24 ENCOUNTER — Encounter (HOSPITAL_COMMUNITY): Payer: BLUE CROSS/BLUE SHIELD

## 2018-09-27 ENCOUNTER — Encounter (HOSPITAL_COMMUNITY): Payer: BLUE CROSS/BLUE SHIELD

## 2018-09-27 ENCOUNTER — Ambulatory Visit (HOSPITAL_COMMUNITY): Payer: BLUE CROSS/BLUE SHIELD

## 2018-09-28 ENCOUNTER — Other Ambulatory Visit: Payer: Self-pay | Admitting: *Deleted

## 2018-09-28 MED ORDER — TICAGRELOR 90 MG PO TABS: 90.0000 mg | ORAL_TABLET | Freq: Two times a day (BID) | ORAL | 3 refills | Status: DC

## 2018-09-29 ENCOUNTER — Ambulatory Visit (HOSPITAL_COMMUNITY): Payer: BLUE CROSS/BLUE SHIELD

## 2018-09-29 ENCOUNTER — Encounter (HOSPITAL_COMMUNITY): Payer: BLUE CROSS/BLUE SHIELD

## 2018-09-29 MED ORDER — TICAGRELOR 90 MG PO TABS: 90.0000 mg | ORAL_TABLET | Freq: Two times a day (BID) | ORAL | 3 refills | Status: DC

## 2018-10-06 DIAGNOSIS — G4733 Obstructive sleep apnea (adult) (pediatric): Secondary | ICD-10-CM | POA: Diagnosis not present

## 2018-10-07 DIAGNOSIS — G4733 Obstructive sleep apnea (adult) (pediatric): Secondary | ICD-10-CM | POA: Diagnosis not present

## 2018-11-02 ENCOUNTER — Other Ambulatory Visit: Payer: Self-pay

## 2018-11-02 MED ORDER — EZETIMIBE 10 MG PO TABS: 10.0000 mg | ORAL_TABLET | Freq: Every day | ORAL | 3 refills | Status: DC

## 2018-11-07 DIAGNOSIS — G4733 Obstructive sleep apnea (adult) (pediatric): Secondary | ICD-10-CM | POA: Diagnosis not present

## 2018-11-09 ENCOUNTER — Ambulatory Visit: Payer: BLUE CROSS/BLUE SHIELD | Admitting: Cardiovascular Disease

## 2018-11-18 NOTE — Telephone Encounter (Signed)
Pt has appt set for 02/09/2019 at 9:00am

## 2018-12-07 DIAGNOSIS — G4733 Obstructive sleep apnea (adult) (pediatric): Secondary | ICD-10-CM | POA: Diagnosis not present

## 2018-12-09 ENCOUNTER — Telehealth: Payer: Self-pay | Admitting: Cardiovascular Disease

## 2018-12-09 NOTE — Telephone Encounter (Signed)
Message routed to [place], RN to determine/discuss with MD if patient needs lab work prior to June visit

## 2018-12-09 NOTE — Telephone Encounter (Signed)
New Message    Pt is calling and is wondering if she is suppose to have lab work before her appt.  She says she doesn't want too    Please call

## 2018-12-21 NOTE — Telephone Encounter (Signed)
Spoke with pt and informed that no labs requested during her last OV with Dr. Gwenlyn Found on 05/11/2018. Pt made aware that although this is the case, pt has FLP due that was ordered by Dr. Debara Pickett. Pt does not want OV and she was scheduled on a DOD day. Informed pt that DOD day is all OV's for Dr. Gwenlyn Found so appt date and time can be changed. Pt agreeable with this and rescheduled for 6/16 at 8:30 am. Pt verbalized understanding      Virtual Visit Pre-Appointment Phone Call  "(Name), I am calling you today to discuss your upcoming appointment. We are currently trying to limit exposure to the virus that causes COVID-19 by seeing patients at home rather than in the office."  1. "What is the BEST phone number to call the day of the visit?" - include this in appointment notes  2. "Do you have or have access to (through a family member/friend) a smartphone with video capability that we can use for your visit?" a. If yes - list this number in appt notes as "cell" (if different from BEST phone #) and list the appointment type as a VIDEO visit in appointment notes b. If no - list the appointment type as a PHONE visit in appointment notes  3. Confirm consent - "In the setting of the current Covid19 crisis, you are scheduled for a (phone or video) visit with your provider on (date) at (time).  Just as we do with many in-office visits, in order for you to participate in this visit, we must obtain consent.  If you'd like, I can send this to your mychart (if signed up) or email for you to review.  Otherwise, I can obtain your verbal consent now.  All virtual visits are billed to your insurance company just like a normal visit would be.  By agreeing to a virtual visit, we'd like you to understand that the technology does not allow for your provider to perform an examination, and thus may limit your provider's ability to fully assess your condition. If your provider identifies any concerns that need to be evaluated in  person, we will make arrangements to do so.  Finally, though the technology is pretty good, we cannot assure that it will always work on either your or our end, and in the setting of a video visit, we may have to convert it to a phone-only visit.  In either situation, we cannot ensure that we have a secure connection.  Are you willing to proceed?" STAFF: Did the patient verbally acknowledge consent to telehealth visit? Document YES/NO here: yes  4. Advise patient to be prepared - "Two hours prior to your appointment, go ahead and check your blood pressure, pulse, oxygen saturation, and your weight (if you have the equipment to check those) and write them all down. When your visit starts, your provider will ask you for this information. If you have an Apple Watch or Kardia device, please plan to have heart rate information ready on the day of your appointment. Please have a pen and paper handy nearby the day of the visit as well."  5. Give patient instructions for MyChart download to smartphone OR Doximity/Doxy.me as below if video visit (depending on what platform provider is using)  6. Inform patient they will receive a phone call 15 minutes prior to their appointment time (may be from unknown caller ID) so they should be prepared to answer    [place] has been deemed a candidate for a follow-up tele-health visit to limit community exposure during the Covid-19 pandemic. I spoke with the patient via phone to ensure availability of phone/video source, confirm preferred email & phone number, and discuss instructions and expectations.  I reminded Cathy Bell to be prepared with any vital sign and/or heart rhythm information that could potentially be obtained via home monitoring, at the time of her visit. I reminded Cathy Bell to expect a phone call prior to her visit.  Annita Brod, RN 12/21/2018 4:38 PM   INSTRUCTIONS FOR DOWNLOADING THE MYCHART APP TO  SMARTPHONE  - The patient must first make sure to have activated MyChart and know their login information - If Apple, go to [employer] and type in MyChart in the search bar and download the app. If Android, ask patient to go to [employer] and type in [place] in the search bar and download the app. The app is free but as with any other app downloads, their phone may require them to verify saved payment information or Apple/Android password.  - The patient will need to then log into the app with their MyChart username and password, and select [facility] as their healthcare provider to link the account. When it is time for your visit, go to the MyChart app, find appointments, and click Begin Video Visit. Be sure to Select Allow for your device to access the Microphone and Camera for your visit. You will then be connected, and your provider will be with you shortly.  **If they have any issues connecting, or need assistance please contact MyChart service desk [phone])**  **If using a computer, in order to ensure the best quality for their visit they will need to use either of the following Internet Browsers: [employer], or Google Chrome**  IF USING DOXIMITY or DOXY.ME - The patient will receive a link just prior to their visit by text.     FULL LENGTH CONSENT FOR TELE-HEALTH VISIT   I hereby voluntarily request, consent and authorize [facility] and its employed or contracted physicians, physician assistants, nurse practitioners or other licensed health care professionals (the Practitioner), to provide me with telemedicine health care services (the "Services") as deemed necessary by the treating Practitioner. I acknowledge and consent to receive the Services by the Practitioner via telemedicine. I understand that the telemedicine visit will involve communicating with the Practitioner through live audiovisual communication technology and the disclosure of certain medical  information by electronic transmission. I acknowledge that I have been given the opportunity to request an in-person assessment or other available alternative prior to the telemedicine visit and am voluntarily participating in the telemedicine visit.  I understand that I have the right to withhold or withdraw my consent to the use of telemedicine in the course of my care at any time, without affecting my right to future care or treatment, and that the Practitioner or I may terminate the telemedicine visit at any time. I understand that I have the right to inspect all information obtained and/or recorded in the course of the telemedicine visit and may receive copies of available information for a reasonable fee.  I understand that some of the potential risks of receiving the Services via telemedicine include:  Marland Kitchen Delay or interruption in medical evaluation due to technological equipment failure or disruption; . Information transmitted may not be sufficient (e.g. poor resolution of images) to allow for appropriate medical decision making by the Practitioner;  and/or  . In rare instances, security protocols could fail, causing a breach of personal health information.  Furthermore, I acknowledge that it is my responsibility to provide information about my medical history, conditions and care that is complete and accurate to the best of my ability. I acknowledge that Practitioner's advice, recommendations, and/or decision may be based on factors not within their control, such as incomplete or inaccurate data provided by me or distortions of diagnostic images or specimens that may result from electronic transmissions. I understand that the practice of medicine is not an exact science and that Practitioner makes no warranties or guarantees regarding treatment outcomes. I acknowledge that I will receive a copy of this consent concurrently upon execution via email to the email address I last provided but may also  request a printed copy by calling the office of [facility].    I understand that my insurance will be billed for this visit.   I have read or had this consent read to me. . I understand the contents of this consent, which adequately explains the benefits and risks of the Services being provided via telemedicine.  . I have been provided ample opportunity to ask questions regarding this consent and the Services and have had my questions answered to my satisfaction. . I give my informed consent for the services to be provided through the use of telemedicine in my medical care  By participating in this telemedicine visit I agree to the above.

## 2018-12-22 ENCOUNTER — Telehealth: Payer: Self-pay | Admitting: Cardiovascular Disease

## 2018-12-22 NOTE — Telephone Encounter (Signed)
smartphone/ consent/ my chart active/ pre reg completed °

## 2018-12-28 ENCOUNTER — Telehealth: Payer: Self-pay

## 2018-12-28 ENCOUNTER — Encounter: Payer: Self-pay | Admitting: Cardiovascular Disease

## 2018-12-28 ENCOUNTER — Telehealth (INDEPENDENT_AMBULATORY_CARE_PROVIDER_SITE_OTHER): Payer: BC Managed Care – PPO | Admitting: Cardiovascular Disease

## 2018-12-28 DIAGNOSIS — I2510 Atherosclerotic heart disease of native coronary artery without angina pectoris: Secondary | ICD-10-CM | POA: Diagnosis not present

## 2018-12-28 DIAGNOSIS — E7849 Other hyperlipidemia: Secondary | ICD-10-CM | POA: Diagnosis not present

## 2018-12-28 DIAGNOSIS — I2119 ST elevation (STEMI) myocardial infarction involving other coronary artery of inferior wall: Secondary | ICD-10-CM | POA: Diagnosis not present

## 2018-12-28 DIAGNOSIS — G4733 Obstructive sleep apnea (adult) (pediatric): Secondary | ICD-10-CM

## 2018-12-28 DIAGNOSIS — Z9861 Coronary angioplasty status: Secondary | ICD-10-CM

## 2018-12-28 NOTE — Patient Instructions (Signed)

## 2018-12-28 NOTE — Telephone Encounter (Signed)
Patient and/or DPR-approved person aware of 12/28/18 AVS instructions and verbalized understanding. AVS RELEASED TO [facility]

## 2018-12-28 NOTE — Telephone Encounter (Signed)

## 2018-12-28 NOTE — Progress Notes (Signed)
Virtual Visit via Video Note   This visit type was conducted due to national recommendations for restrictions regarding the COVID-19 Pandemic (e.g. social distancing) in an effort to limit this patient's exposure and mitigate transmission in our community.  Due to her co-morbid illnesses, this patient is at least at moderate risk for complications without adequate follow up.  This format is felt to be most appropriate for this patient at this time.  All issues noted in this document were discussed and addressed.  A limited physical exam was performed with this format.  Please refer to the patient's chart for her consent to telehealth for [facility].   Date:  12/28/2018   ID:  Cathy Bell, DOB [id number], MRN [id number]  Patient Location: Home Provider Location: Home  PCP:  Marin Olp, MD  Cardiologist:  Quay Burow, MD  Electrophysiologist:  None   Evaluation Performed:  Follow-Up Visit  Chief Complaint: Follow-up CAD  History of Present Illness:    Cathy Bell is a 58 y.o.  moderately overweight divorced Caucasian femalewho I last saw in the office  05/11/2018. She presented on 09/26/2017 with chest pain while walking in the park. EKG showed inferior ST segment elevation with reciprocal anterior depression. I brought her to the Cath Lab on Saturday morning and opened up an occluded mid RCA with a synergy drug-eluting stent. She did have inferobasal hypokinesia which improved by 2D echo the following day. She did have an 80% mid LAD lesion with a subsequent negative Myoview stress test. Her major complaints are of fatigue. Her other problems include hyperlipidemia on high-dose statin therapy with mildly elevated liver function tests. She is on dual antiplatelet therapy with Brilinta. Since I saw her 2 months ago she had progressive chest pressure especially when walking up an incline. A recent coronary CTA performed 03/26/2018 confirmed a moderate mid LAD  lesion but unfortunately FFR could not be performed. Recently progressive symptoms of decided to proceed with elective outpatient LAD intervention. I performed outpatient coronary intervention on her 04/22/2018 with a drug-eluting stent placed in the mid LAD.  I also dilated her diagonal branch ostium through the stent struts.  Her RCA stent was patent at that time.  Her symptoms are markedly improved.  Since I saw her 9 months ago she has done well.  She denies chest pain or shortness of breath.  She is still active.  She remains on aspirin and Brilinta.  She has an excellent lipid profile followed by Dr. Debara Pickett in the lipid clinic.   The patient does not have symptoms concerning for COVID-19 infection (fever, chills, cough, or new shortness of breath).    Past Medical History:  Diagnosis Date   Acute ST elevation myocardial infarction (STEMI) of inferior wall ([place]) 09/26/2017   Pt presented 09/26/17 with an acute inferior STEMI. Door to balloon time 30 minutes.    Coronary artery disease    Heart disease    History of Epstein-Barr virus infection    Hyperlipidemia    OSA (obstructive sleep apnea) 08/19/2018    mild obstructive sleep apnea with an AHI of 10.6/h and oxygen desaturations as low as 88%.   Sleep apnea 2007   uses c pap on occasion   Past Surgical History:  Procedure Laterality Date   CARDIAC CATHETERIZATION     CORONARY ANGIOPLASTY WITH STENT PLACEMENT  04/22/2018   CORONARY STENT INTERVENTION N/A 04/22/2018   Procedure: CORONARY STENT INTERVENTION;  Surgeon: Lorretta Harp, MD;  Location:  [facility];  Service: Cardiovascular;  Laterality: N/A;   CORONARY/GRAFT ACUTE MI REVASCULARIZATION N/A 09/26/2017   Procedure: Coronary/Graft Acute MI Revascularization;  Surgeon: Lorretta Harp, MD;  Location: [facility] CV LAB;  Service: Cardiovascular;  Laterality: N/A;   LEFT HEART CATH AND CORONARY ANGIOGRAPHY N/A 09/26/2017   Procedure: LEFT HEART CATH AND  CORONARY ANGIOGRAPHY;  Surgeon: Lorretta Harp, MD;  Location: [facility] CV [facility];  Service: Cardiovascular;  Laterality: N/A;   nodulectomy     Salzmann Nodular Degeneration   STENT PLACEMENT VASCULAR ([place] HX)  2019   TONSILLECTOMY AND ADENOIDECTOMY  1967   WISDOM TOOTH EXTRACTION  1981     No outpatient medications have been marked as taking for the 12/28/18 encounter (Appointment) with Lorretta Harp, MD.     Allergies:   Cefprozil, Cheese, Peanut-containing drug products, Prednisolone, Salmon [fish allergy], and Wellbutrin [bupropion hcl]   Social History   Tobacco Use   Smoking status: Never Smoker   Smokeless tobacco: Never Used  Substance Use Topics   Alcohol use: No   Drug use: No     Family Hx: The patient's family history includes Atrial fibrillation in her father; Breast cancer in her sister; Colon cancer in her father; Diabetes in her maternal grandfather; Hashimoto's thyroiditis in her sister; Hyperlipidemia in her mother; Other in her mother; Stroke in her son.  ROS:   Please see the history of present illness.     All other systems reviewed and are negative.   Prior CV studies:   The following studies were reviewed today:  None  Labs/Other Tests and Data Reviewed:    EKG:  No ECG reviewed.  Recent Labs: 01/28/2018: Magnesium 2.1 05/06/2018: ALT 36; BUN 20; Creatinine, Ser 0.82; Hemoglobin 12.9; Platelets 193.0; Potassium 4.6; Sodium 139; TSH 1.32   Recent Lipid Panel Lab Results  Component Value Date/Time   CHOL 132 05/12/2018 08:22 AM   TRIG 60 05/12/2018 08:22 AM   HDL 58 05/12/2018 08:22 AM   CHOLHDL 2.3 05/12/2018 08:22 AM   CHOLHDL 4.6 09/26/2017 11:34 PM   LDLCALC 62 05/12/2018 08:22 AM   LDLDIRECT 91.0 11/03/2017 02:51 PM    Wt Readings from Last 3 Encounters:  08/19/18 167 lb 12.8 oz (76.1 kg)  06/24/18 163 lb 5.8 oz (74.1 kg)  05/20/18 160 lb 6.4 oz (72.8 kg)     Objective:    Vital Signs:  LMP 03/18/2011    VITAL  SIGNS:  reviewed GEN:  no acute distress RESPIRATORY:  normal respiratory effort, symmetric expansion NEURO:  alert and oriented x 3, no obvious focal deficit PSYCH:  normal affect  ASSESSMENT & PLAN:    1. Coronary artery disease- history of CAD status post inferior STEMI 09/26/2017 treated with PCI and drug-eluting stenting using a synergy drug-eluting stent of the dominant RCA.  At that time she had 80% mid LAD was which was treated medically.  Subsequent Myoview stress test showed no ischemia.  Because of her recurrent symptoms I did re-catheter 04/22/2018 and stented her LAD.  I performed angioplasty of jailed diagonal branch through the stent struts.  She is done well since denying chest pain or shortness of breath.  She remains on dual antiplatelet therapy including aspirin and Brilinta. 2. Hyperlipidemia- history of hyperlipidemia on high-dose atorvastatin and Zetia.  I did send her to Dr. Debara Pickett for lipid management.  Her most recent lipid profile performed 05/12/2018 revealed an LDL of 62 and HDL 58.  COVID-19 Education:  The signs and symptoms of COVID-19 were discussed with the patient and how to seek care for testing (follow up with PCP or arrange E-visit).  The importance of social distancing was discussed today.  Time:   Today, I have spent 8 minutes with the patient with telehealth technology discussing the above problems.     Medication Adjustments/Labs and Tests Ordered: Current medicines are reviewed at length with the patient today.  Concerns regarding medicines are outlined above.   Tests Ordered: No orders of the defined types were placed in this encounter.   Medication Changes: No orders of the defined types were placed in this encounter.   Follow Up:  In Person in 6 month(s)  Signed, Quay Burow, MD  12/28/2018 7:47 AM    [place]

## 2018-12-29 ENCOUNTER — Telehealth: Payer: BLUE CROSS/BLUE SHIELD | Admitting: Cardiovascular Disease

## 2019-01-07 DIAGNOSIS — G4733 Obstructive sleep apnea (adult) (pediatric): Secondary | ICD-10-CM | POA: Diagnosis not present

## 2019-01-21 DIAGNOSIS — G4733 Obstructive sleep apnea (adult) (pediatric): Secondary | ICD-10-CM | POA: Diagnosis not present

## 2019-02-06 DIAGNOSIS — G4733 Obstructive sleep apnea (adult) (pediatric): Secondary | ICD-10-CM | POA: Diagnosis not present

## 2019-02-09 ENCOUNTER — Ambulatory Visit: Payer: BLUE CROSS/BLUE SHIELD | Admitting: Cardiovascular Disease

## 2019-03-09 DIAGNOSIS — G4733 Obstructive sleep apnea (adult) (pediatric): Secondary | ICD-10-CM | POA: Diagnosis not present

## 2019-04-06 ENCOUNTER — Other Ambulatory Visit: Payer: Self-pay

## 2019-04-06 ENCOUNTER — Encounter: Payer: Self-pay | Admitting: Family Medicine

## 2019-04-06 ENCOUNTER — Ambulatory Visit (INDEPENDENT_AMBULATORY_CARE_PROVIDER_SITE_OTHER): Payer: BC Managed Care – PPO

## 2019-04-06 DIAGNOSIS — Z23 Encounter for immunization: Secondary | ICD-10-CM

## 2019-04-09 DIAGNOSIS — G4733 Obstructive sleep apnea (adult) (pediatric): Secondary | ICD-10-CM | POA: Diagnosis not present

## 2019-04-12 NOTE — Telephone Encounter (Signed)
Spoke with pt  She states that the pharmacy said that her cost for her Brilinta would be $1100 this month. She states that she left a message for her insurance to call her. She will CB if anything else is needed. Called pharmacy pharmacist states that pt's INSURANCE changed her deductible in the middle of the year.

## 2019-04-12 NOTE — Telephone Encounter (Signed)
Called pharmacy and they manually put in the co-pay card manually and she states that our co-pay card makes her payment will be $75.   Called and notified pt she will call pharmacy and discuss with them.

## 2019-05-09 DIAGNOSIS — G4733 Obstructive sleep apnea (adult) (pediatric): Secondary | ICD-10-CM | POA: Diagnosis not present

## 2019-06-07 ENCOUNTER — Other Ambulatory Visit: Payer: Self-pay | Admitting: Medical

## 2019-06-07 NOTE — Telephone Encounter (Signed)
Rx request sent to pharmacy.  

## 2019-06-09 DIAGNOSIS — G4733 Obstructive sleep apnea (adult) (pediatric): Secondary | ICD-10-CM | POA: Diagnosis not present

## 2019-06-22 DIAGNOSIS — G4733 Obstructive sleep apnea (adult) (pediatric): Secondary | ICD-10-CM | POA: Diagnosis not present

## 2019-07-14 ENCOUNTER — Other Ambulatory Visit: Payer: Self-pay

## 2019-07-14 MED ORDER — NITROGLYCERIN 0.4 MG SL SUBL: 0.4000 mg | SUBLINGUAL_TABLET | SUBLINGUAL | 3 refills | Status: DC | PRN

## 2019-08-02 DIAGNOSIS — N8111 Cystocele, midline: Secondary | ICD-10-CM | POA: Diagnosis not present

## 2019-08-02 DIAGNOSIS — M8580 Other specified disorders of bone density and structure, unspecified site: Secondary | ICD-10-CM | POA: Diagnosis not present

## 2019-08-02 DIAGNOSIS — R300 Dysuria: Secondary | ICD-10-CM | POA: Diagnosis not present

## 2019-08-02 DIAGNOSIS — Z1151 Encounter for screening for human papillomavirus (HPV): Secondary | ICD-10-CM | POA: Diagnosis not present

## 2019-08-02 DIAGNOSIS — N898 Other specified noninflammatory disorders of vagina: Secondary | ICD-10-CM | POA: Diagnosis not present

## 2019-08-02 DIAGNOSIS — Z6827 Body mass index (BMI) 27.0-27.9, adult: Secondary | ICD-10-CM | POA: Diagnosis not present

## 2019-08-02 DIAGNOSIS — Z01419 Encounter for gynecological examination (general) (routine) without abnormal findings: Secondary | ICD-10-CM | POA: Diagnosis not present

## 2019-10-11 ENCOUNTER — Other Ambulatory Visit: Payer: Self-pay | Admitting: Medical

## 2019-10-12 ENCOUNTER — Other Ambulatory Visit: Payer: Self-pay | Admitting: Cardiovascular Disease

## 2019-10-12 NOTE — Telephone Encounter (Signed)
I called this patient to schedule her follow up with Dr. Gwenlyn Found ---she is wanting to know if she needs lab work prior to her visit.  Please advise.

## 2019-11-03 DIAGNOSIS — H25813 Combined forms of age-related cataract, bilateral: Secondary | ICD-10-CM | POA: Diagnosis not present

## 2019-11-11 ENCOUNTER — Encounter: Payer: Self-pay | Admitting: Gastroenterology

## 2019-11-11 ENCOUNTER — Encounter: Payer: Self-pay | Admitting: Cardiovascular Disease

## 2019-11-11 ENCOUNTER — Other Ambulatory Visit: Payer: Self-pay

## 2019-11-11 ENCOUNTER — Ambulatory Visit (INDEPENDENT_AMBULATORY_CARE_PROVIDER_SITE_OTHER): Payer: BC Managed Care – PPO | Admitting: Cardiovascular Disease

## 2019-11-11 VITALS — BP 125/74 | HR 68 | Temp 97.1°F | Ht 62.0 in | Wt 163.4 lb

## 2019-11-11 DIAGNOSIS — G4733 Obstructive sleep apnea (adult) (pediatric): Secondary | ICD-10-CM

## 2019-11-11 DIAGNOSIS — E7849 Other hyperlipidemia: Secondary | ICD-10-CM

## 2019-11-11 LAB — HEPATIC FUNCTION PANEL
ALT: 31 IU/L (ref 0–32)
AST: 22 IU/L (ref 0–40)
Albumin: 4.6 g/dL (ref 3.8–4.9)
Alkaline Phosphatase: 86 IU/L (ref 39–117)
Bilirubin Total: 0.7 mg/dL (ref 0.0–1.2)
Bilirubin, Direct: 0.2 mg/dL (ref 0.00–0.40)
Total Protein: 6.7 g/dL (ref 6.0–8.5)

## 2019-11-11 LAB — LIPID PANEL
Chol/HDL Ratio: 2.8 ratio (ref 0.0–4.4)
Cholesterol, Total: 114 mg/dL (ref 100–199)
HDL: 41 mg/dL (ref >39)
LDL Chol Calc (NIH): 56 mg/dL (ref 0–99)
Triglycerides: 84 mg/dL (ref 0–149)
VLDL Cholesterol Cal: 17 mg/dL (ref 5–40)

## 2019-11-11 MED ORDER — CLOPIDOGREL BISULFATE 75 MG PO TABS: 75.0000 mg | ORAL_TABLET | Freq: Every day | ORAL | 3 refills | Status: DC

## 2019-11-11 NOTE — Assessment & Plan Note (Signed)
History of inferior STEMI 09/26/2017.  I took her to the Cath Lab Saturday morning and opened up an occluded RCA with a synergy drug-eluting stent.  She did have inferobasal hypokinesia although her EF improved by 2D echo the following day.  She had an 80% mid LAD lesion which we elected to treat medically at that time.  Because of recurrent symptoms I recath her 04/22/2018 and open of the mid LAD with a drug-eluting stent as well.  Her symptoms have since resolved.  She remains on aspirin and Brilinta.  My plans will be to discontinue Brilinta and should transition to clopidogrel 75 mg once a day.  I have cleared her to interrupt her antiplatelet therapy for colonoscopy in the near future.

## 2019-11-11 NOTE — Progress Notes (Signed)
11/11/2019 [facility]   1961-02-22  [id number]  Primary Physician Yong Channel Brayton Mars, MD Primary Cardiologist: Lorretta Harp MD FACP, [place], [place], [place]  HPI:  Cathy Bell is a 59 y.o.  moderately overweight divorced Caucasian femalewho I last saw for a virtual telemedicine video visit 12/28/2018.  She works as a Pharmacist, hospital.  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 She presented on 09/26/2017 with chest pain while walking in the park. EKG showed inferior ST segment elevation with reciprocal anterior depression. I brought her to the Cath Lab on Saturday morning and opened up an occluded mid RCA with a synergy drug-eluting stent. She did have inferobasal hypokinesia which improved by 2D echo the following day. She did have an 80% mid LAD lesion with a subsequent negative Myoview stress test. Her major complaints are of fatigue. Her other problems include hyperlipidemia on high-dose statin therapy with mildly elevated liver function tests. She is on dual antiplatelet therapy with Brilinta.  She  had progressive chest pressure especially when walking up an incline. A recent coronary CTA performed 03/26/2018 confirmed a moderate mid LAD lesion but unfortunately FFR could not be performed. Recently progressive symptoms of decided to proceed with elective outpatient LAD intervention. I performed outpatient coronary intervention on her 04/22/2018 with a drug-eluting stent placed in the mid LAD. I also dilated her diagonal branch ostium through the stent struts. Her RCA stent was patent at that time. Her symptoms are markedly improved.  Since I saw her virtually 10 months ago she continues to do well.  She exercises on a daily basis.  She denies chest pain or shortness of breath.  She does complain of some gum bleeding.  She continues on aspirin and  Brilinta.    Current Meds  Medication Sig  . acetaminophen (TYLENOL) 500 MG tablet Take 500-1,000 mg by mouth every 6 (six) hours as needed (for pain.).  Marland Kitchen aspirin 81 MG chewable tablet Chew 1 tablet (81 mg total) by mouth daily. (Patient taking differently: Chew 81 mg by mouth daily at 6 (six) AM. 0630)  . atorvastatin (LIPITOR) 80 MG tablet TAKE 1 TABLET (80 MG TOTAL) BY MOUTH DAILY AT 6 PM.  . Camphor-Eucalyptus-Menthol (VICKS CASERO EX) Apply 1 application topically.  . Cholecalciferol (VITAMIN D3) 5000 units TABS Take 5,000 Units by mouth 3 (three) times a week. 0630   . CRANBERRY PO Take 3 capsules by mouth 3 (three) times daily as needed (for urinary tract infection symptoms.).  Marland Kitchen D-Mannose 500 MG CAPS Take 500 mg by mouth 3 (three) times daily as needed (for urinary tract infection symptoms.).  Marland Kitchen ezetimibe (ZETIA) 10 MG tablet TAKE 1 TABLET BY MOUTH EVERY DAY  . loratadine (CLARITIN) 10 MG tablet Take 10 mg by mouth daily as needed for allergies.  . nitroGLYCERIN (NITROSTAT) 0.4 MG SL tablet Place 1 tablet (0.4 mg total) under the tongue every 5 (five) minutes x 3 doses as needed for chest pain.  . Omega-3 Fatty Acids (SUPER OMEGA-3) 1000 MG CAPS Take 1,000 mg by mouth 2 (two) times daily. 0630   . Probiotic Product (PROBIOTIC ADVANCED PO) Take 1 capsule by mouth daily at 6 (six) AM. (0630) Suprema Dophilus  . sodium chloride (MURO 128) 5 % ophthalmic ointment Place 1 application into both eyes at bedtime.  . sodium chloride (MURO 128) 5 % ophthalmic solution Place 1 drop into  both eyes 3 (three) times daily.   . [DISCONTINUED] BRILINTA 90 MG TABS tablet TAKE 1 TABLET (90 MG TOTAL) BY MOUTH 2 (TWO) TIMES DAILY.     Allergies  Allergen Reactions  . Cefprozil Hives and Other (See Comments)  . Cheese Hives  . Peanut-Containing Drug Products Hives  . Prednisolone     Caused dangerously high eye pressure around time of eye surgery  . Salmon [Fish Allergy] Hives  . Wellbutrin  [Bupropion Hcl] Hives    Social History   Socioeconomic History  . Marital status: Divorced    Spouse name: Not on file  . Number of children: 2  . Years of education: Not on file  . Highest education level: Master's degree (e.g., MA, MS, MEng, MEd, MSW, MBA)  Occupational History  . Occupation: Optometrist  Tobacco Use  . Smoking status: Never Smoker  . Smokeless tobacco: Never Used  Substance and Sexual Activity  . Alcohol use: No  . Drug use: No  . Sexual activity: Not Currently  Other Topics Concern  . Not on file  Social History Narrative   Divorced. 2 sons [name] 26 and [name]. No grandkids yet.       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 Graduate school at [place] [employer]: walking, surface design- Financial planner, play mahjong, movies, reading, travel   Social Determinants of Radio broadcast assistant Strain:   . Difficulty of Paying Living Expenses:   Food Insecurity:   . Worried About Charity fundraiser in the Last Year:   . Arboriculturist in the Last Year:   Transportation Needs:   . Film/video editor (Medical):   Marland Kitchen Lack of Transportation (Non-Medical):   Physical Activity:   . Days of Exercise per Week:   . Minutes of Exercise per Session:   Stress:   . Feeling of Stress :   Social Connections:   . Frequency of Communication with Friends and Family:   . Frequency of Social Gatherings with Friends and Family:   . Attends Religious Services:   . Active Member of Clubs or Organizations:   . Attends Archivist Meetings:   Marland Kitchen Marital Status:   Intimate Partner Violence:   . Fear of Current or Ex-Partner:   . Emotionally Abused:   Marland Kitchen Physically Abused:   . Sexually Abused:      Review of Systems: General: negative for chills, fever, night sweats or weight changes.  Cardiovascular: negative for chest pain, dyspnea  on exertion, edema, orthopnea, palpitations, paroxysmal nocturnal dyspnea or shortness of breath Dermatological: negative for rash Respiratory: negative for cough or wheezing Urologic: negative for hematuria Abdominal: negative for nausea, vomiting, diarrhea, bright red blood per rectum, melena, or hematemesis Neurologic: negative for visual changes, syncope, or dizziness All other systems reviewed and are otherwise negative except as noted above.    Blood pressure 125/74, pulse 68, temperature (!) 97.1 F (36.2 C), height 5\' 2"  (1.575 m), weight 163 lb 6.4 oz (74.1 kg), last menstrual period 03/18/2011, SpO2 99 %.  General appearance: alert and no distress Neck: no adenopathy, no carotid bruit, no JVD, supple, symmetrical, trachea midline and thyroid not enlarged, symmetric, no tenderness/mass/nodules Lungs: clear to auscultation bilaterally Heart: regular rate and rhythm, S1, S2 normal, no murmur, click, rub or gallop Extremities: extremities normal, atraumatic, no cyanosis or  edema Pulses: 2+ and symmetric Skin: Skin color, texture, turgor normal. No rashes or lesions Neurologic: Alert and oriented X 3, normal strength and tone. Normal symmetric reflexes. Normal coordination and gait  EKG sinus rhythm at 68 with left axis deviation.  I personally reviewed this EKG.  ASSESSMENT AND PLAN:   Acute ST elevation myocardial infarction (STEMI) of inferior wall (HCC) History of inferior STEMI 09/26/2017.  I took her to the Cath Lab Saturday morning and opened up an occluded RCA with a synergy drug-eluting stent.  She did have inferobasal hypokinesia although her EF improved by 2D echo the following day.  She had an 80% mid LAD lesion which we elected to treat medically at that time.  Because of recurrent symptoms I recath her 04/22/2018 and open of the mid LAD with a drug-eluting stent as well.  Her symptoms have since resolved.  She remains on aspirin and Brilinta.  My plans will be to  discontinue Brilinta and should transition to clopidogrel 75 mg once a day.  I have cleared her to interrupt her antiplatelet therapy for colonoscopy in the near future.  Familial hyperlipidemia History of familial hyperlipidemia on high-dose atorvastatin and Zetia.  We will recheck a lipid liver profile this morning.  OSA (obstructive sleep apnea) History of obstructive sleep apnea on CPAP which she benefits from      Lorretta Harp MD [facility], [facility] 11/11/2019 8:16 AM

## 2019-11-11 NOTE — Assessment & Plan Note (Signed)
History of familial hyperlipidemia on high-dose atorvastatin and Zetia.  We will recheck a lipid liver profile this morning.

## 2019-11-11 NOTE — Assessment & Plan Note (Signed)
History of obstructive sleep apnea on CPAP which she benefits from 

## 2019-11-11 NOTE — Patient Instructions (Signed)
Medication Instructions:  Stop Brilinita Start Plavix 75 mg daily   *If you need a refill on your cardiac medications before your next appointment, please call your pharmacy*   Lab Work: LIPID and LIVER today   If you have labs (blood work) drawn today and your tests are completely normal, you will receive your results only by: Marland Kitchen MyChart Message (if you have MyChart) OR . A paper copy in the mail If you have any lab test that is abnormal or we need to change your treatment, we will call you to review the results.    Follow-Up: At [facility], you and your health needs are our priority.  As part of our continuing mission to provide you with exceptional heart care, we have created designated Provider Care Teams.  These Care Teams include your primary Cardiologist (physician) and Advanced Practice Providers (APPs -  Physician Assistants and Nurse Practitioners) who all work together to provide you with the care you need, when you need it.  We recommend signing up for the patient portal called "MyChart".  Sign up information is provided on this After Visit Summary.  MyChart is used to connect with patients for Virtual Visits (Telemedicine).  Patients are able to view lab/test results, encounter notes, upcoming appointments, etc.  Non-urgent messages can be sent to your provider as well.   To learn more about what you can do with MyChart, go to [email or web address].    Your next appointment:   12 month(s)  The format for your next appointment:   In Person  Provider:   Quay Burow, MD

## 2019-12-13 ENCOUNTER — Encounter: Payer: Self-pay | Admitting: Gastroenterology

## 2019-12-13 ENCOUNTER — Ambulatory Visit (INDEPENDENT_AMBULATORY_CARE_PROVIDER_SITE_OTHER): Payer: BC Managed Care – PPO | Admitting: Gastroenterology

## 2019-12-13 VITALS — BP 92/58 | HR 77 | Ht 62.0 in | Wt 162.8 lb

## 2019-12-13 DIAGNOSIS — Z800 Family history of malignant neoplasm of digestive organs: Secondary | ICD-10-CM | POA: Diagnosis not present

## 2019-12-13 NOTE — Patient Instructions (Signed)
If you are age 60 or older, your body mass index should be between 23-30. Your Body mass index is 29.78 kg/m. If this is out of the aforementioned range listed, please consider follow up with your Primary Care Provider.  If you are age 69 or younger, your body mass index should be between 19-25. Your Body mass index is 29.78 kg/m. If this is out of the aformentioned range listed, please consider follow up with your Primary Care Provider.   You will need to have recall colonoscopy in Jan 2023.  If you have consistent loose stools, please contact our office and we will order GI pathogen panel.  Thank you for entrusting me with your care and choosing [facility].  Dr Ardis Hughs

## 2019-12-13 NOTE — Progress Notes (Signed)
Review of pertinent gastrointestinal problems: 1.  Family history colon cancer, her father had colon cancer diagnosed at age 60.  Colonoscopy January 2013 was completely normal.   HPI: This is a very pleasant 59 year old woman whom I last saw about 8 years ago at the time of a colonoscopy for family history of colon cancer.  She has not had any overt GI bleeding, no significant constipation or diarrhea, no significant abdominal pains.  She is bothered by some vague sensation of abdominal protrusion.  She likens it to E. coli: Infection which she has had twice in the past.  Not nearly as severe however.  She does not really call bloating sensation.  She is describing a vaginal prolapse sensation as well.  Her gynecologist told her she does have a small prolapse but not significant.  She has also been having some right low back, flank pains.  We discussed her family history of colon cancer.  Her father was 28 when he was diagnosed.  He has recently celebrated his 90th birthday.   Old Data Reviewed: She has significant coronary artery disease with STEMI March 2019 placement of drug-eluting stent in the RCA.  Another coronary angiogram October 2019 with another drug-eluting stent placed in the LAD  She was on aspirin and Brilinta for quite some time and then more recently transitioned to Plavix 75 mg once daily.  Her cardiologist documented in an office note 5 weeks ago that it is okay to interrupt her antiplatelet therapy for colonoscopy.   Review of systems: Pertinent positive and negative review of systems were noted in the above HPI section. All other review negative.   Past Medical History:  Diagnosis Date  . Acute ST elevation myocardial infarction (STEMI) of inferior wall ([place]) 09/26/2017   Pt presented 09/26/17 with an acute inferior STEMI. Door to balloon time 30 minutes.   . Coronary artery disease   . Distended abdomen   . Heart disease   . History of Epstein-Barr virus infection   .  Hyperlipidemia   . IBS (irritable bowel syndrome)   . OSA (obstructive sleep apnea) 08/19/2018    mild obstructive sleep apnea with an AHI of 10.6/h and oxygen desaturations as low as 88%.  . Sleep apnea 2007   uses c pap on occasion    Past Surgical History:  Procedure Laterality Date  . CARDIAC CATHETERIZATION    . CORONARY ANGIOPLASTY WITH STENT PLACEMENT  04/22/2018  . CORONARY STENT INTERVENTION N/A 04/22/2018   Procedure: CORONARY STENT INTERVENTION;  Surgeon: Lorretta Harp, MD;  Location: [place] [facility];  Service: Cardiovascular;  Laterality: N/A;  . CORONARY/GRAFT ACUTE MI REVASCULARIZATION N/A 09/26/2017   Procedure: Coronary/Graft Acute MI Revascularization;  Surgeon: Lorretta Harp, MD;  Location: [facility];  Service: Cardiovascular;  Laterality: N/A;  . LEFT HEART CATH AND CORONARY ANGIOGRAPHY N/A 09/26/2017   Procedure: LEFT HEART CATH AND CORONARY ANGIOGRAPHY;  Surgeon: Lorretta Harp, MD;  Location: [place] [facility];  Service: Cardiovascular;  Laterality: N/A;  . nodulectomy     Salzmann Nodular Degeneration  . STENT PLACEMENT VASCULAR ([place] HX)  2019  . TONSILLECTOMY AND ADENOIDECTOMY  1967  . WISDOM TOOTH EXTRACTION  1981    Current Outpatient Medications  Medication Sig Dispense Refill  . acetaminophen (TYLENOL) 500 MG tablet Take 500-1,000 mg by mouth every 6 (six) hours as needed (for pain.).    Marland Kitchen aspirin 81 MG chewable tablet Chew 1 tablet (81 mg total) by mouth  daily. (Patient taking differently: Chew 81 mg by mouth daily at 6 (six) AM. 0630) 90 tablet 3  . atorvastatin (LIPITOR) 80 MG tablet TAKE 1 TABLET (80 MG TOTAL) BY MOUTH DAILY AT 6 PM. 90 tablet 3  . BRILINTA 90 MG TABS tablet Take 90 mg by mouth 2 (two) times daily.    . Camphor-Eucalyptus-Menthol (VICKS CASERO EX) Apply 1 application topically.    . Cholecalciferol (VITAMIN D3) 5000 units TABS Take 5,000 Units by mouth 3 (three) times a week. 0630     . CRANBERRY PO Take 3 capsules  by mouth 3 (three) times daily as needed (for urinary tract infection symptoms.).    Marland Kitchen D-Mannose 500 MG CAPS Take 500 mg by mouth 3 (three) times daily as needed (for urinary tract infection symptoms.).    Marland Kitchen ezetimibe (ZETIA) 10 MG tablet TAKE 1 TABLET BY MOUTH EVERY DAY 90 tablet 0  . loratadine (CLARITIN) 10 MG tablet Take 10 mg by mouth daily as needed for allergies.    . nitroGLYCERIN (NITROSTAT) 0.4 MG SL tablet Place 1 tablet (0.4 mg total) under the tongue every 5 (five) minutes x 3 doses as needed for chest pain. 25 tablet 3  . Omega-3 Fatty Acids (SUPER OMEGA-3) 1000 MG CAPS Take 1,000 mg by mouth 2 (two) times daily. 0630     . Probiotic Product (PROBIOTIC ADVANCED PO) Take 1 capsule by mouth daily at 6 (six) AM. (0630) Suprema Dophilus    . sodium chloride (MURO 128) 5 % ophthalmic ointment Place 1 application into both eyes at bedtime.    . sodium chloride (MURO 128) 5 % ophthalmic solution Place 1 drop into both eyes 3 (three) times daily.     . clopidogrel (PLAVIX) 75 MG tablet Take 1 tablet (75 mg total) by mouth daily. (Patient not taking: Reported on 12/13/2019) 90 tablet 3   No current facility-administered medications for this visit.    Allergies as of 12/13/2019 - Review Complete 12/13/2019  Allergen Reaction Noted  . Cefprozil Hives and Other (See Comments) 01/17/2015  . Cheese Hives 09/26/2017  . Peanut-containing drug products Hives 09/26/2017  . Prednisolone  11/03/2017  . Salmon [fish allergy] Hives 09/26/2017  . Wellbutrin [bupropion hcl] Hives 07/21/2011    Family History  Problem Relation Age of Onset  . Colon cancer Father        healthy until 94s then developed  . Atrial fibrillation Father        pacemaker  . Hyperlipidemia Mother   . Other Mother        pacemaker. she is very short stature. healthy until 1s  . Breast cancer Sister        stage 0   . Hashimoto's thyroiditis Sister   . Stroke Son        due to PFO  . Diabetes Maternal Grandfather      Social History   Socioeconomic History  . Marital status: Divorced    Spouse name: Not on file  . Number of children: 2  . Years of education: Not on file  . Highest education level: Master's degree (e.g., MA, MS, MEng, MEd, MSW, MBA)  Occupational History  . Occupation: Optometrist  Tobacco Use  . Smoking status: Never Smoker  . Smokeless tobacco: Never Used  Substance and Sexual Activity  . Alcohol use: No  . Drug use: No  . Sexual activity: Not Currently  Other Topics Concern  . Not on file  Social History Narrative  Divorced. 2 sons [name] 26 and [name]. No grandkids yet.       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 Graduate school at [employer]: walking, surface design- Financial planner, play mahjong, movies, reading, travel   Social Determinants of Radio broadcast assistant Strain:   . Difficulty of Paying Living Expenses:   Food Insecurity:   . Worried About Charity fundraiser in the Last Year:   . Arboriculturist in the Last Year:   Transportation Needs:   . Film/video editor (Medical):   Marland Kitchen Lack of Transportation (Non-Medical):   Physical Activity:   . Days of Exercise per Week:   . Minutes of Exercise per Session:   Stress:   . Feeling of Stress :   Social Connections:   . Frequency of Communication with Friends and Family:   . Frequency of Social Gatherings with Friends and Family:   . Attends Religious Services:   . Active Member of Clubs or Organizations:   . Attends Archivist Meetings:   Marland Kitchen Marital Status:   Intimate Partner Violence:   . Fear of Current or Ex-Partner:   . Emotionally Abused:   Marland Kitchen Physically Abused:   . Sexually Abused:      Physical Exam: BP (!) 92/58   Pulse 77   Ht 5\' 2"  (1.575 m)   Wt 162 lb 12.8 oz (73.8 kg)   LMP 03/18/2011   SpO2 98%   BMI 29.78 kg/m  Constitutional:  generally well-appearing Psychiatric: alert and oriented x3 Eyes: extraocular movements intact Mouth: oral pharynx moist, no lesions Neck: supple no lymphadenopathy Cardiovascular: heart regular rate and rhythm Lungs: clear to auscultation bilaterally Abdomen: soft, nontender, nondistended, no obvious ascites, no peritoneal signs, normal bowel sounds Extremities: no lower extremity edema bilaterally Skin: no lesions on visible extremities   Assessment and plan: 59 y.o. female with family history colon cancer  First we had a nice discussion about the evolving colon cancer screening, surveillance recommendations based on family history.  Most current guidelines suggest that first-degree relative should be diagnosed with colon cancer at her before the age of 74 toincrease ones personal risk for colon cancer enough to warrant extra screening.  She was thrilled to hear that.  I explained to her that she is really consider at average risk since her father was diagnosed at the age of 23 when he was diagnosed with colon cancer we will therefore put her in recall colonoscopy January 2023  She has had some vague lower abdominal sensations of protruding belly.  She sometimes has mild intermittent loose stools.  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  I recommended that if she has several loose stools or several days in a row of loose stools to call us and certainly GI pathogen panel would be helpful at that point.   Please see the "Patient Instructions" section for addition details about the plan.   Owens Loffler, MD [facility] 12/13/2019, 10:01 AM  Cc: Marin Olp, MD  Total time on date of encounter was 45  minutes (this included time spent preparing to see the patient reviewing records; obtaining and/or reviewing separately obtained history; performing a medically appropriate exam and/or evaluation; counseling and educating  the patient and family if present; ordering  medications, tests or procedures if applicable; and documenting clinical information in the health record).

## 2019-12-23 DIAGNOSIS — G4733 Obstructive sleep apnea (adult) (pediatric): Secondary | ICD-10-CM | POA: Diagnosis not present

## 2020-01-01 ENCOUNTER — Encounter: Payer: Self-pay | Admitting: Family Medicine

## 2020-01-02 ENCOUNTER — Other Ambulatory Visit: Payer: Self-pay

## 2020-01-02 ENCOUNTER — Encounter: Payer: Self-pay | Admitting: Family Medicine

## 2020-01-02 ENCOUNTER — Ambulatory Visit (INDEPENDENT_AMBULATORY_CARE_PROVIDER_SITE_OTHER): Payer: BC Managed Care – PPO | Admitting: Family Medicine

## 2020-01-02 VITALS — BP 102/60 | HR 77 | Temp 98.5°F | Ht 62.0 in | Wt 159.0 lb

## 2020-01-02 DIAGNOSIS — R1900 Intra-abdominal and pelvic swelling, mass and lump, unspecified site: Secondary | ICD-10-CM | POA: Diagnosis not present

## 2020-01-02 DIAGNOSIS — R221 Localized swelling, mass and lump, neck: Secondary | ICD-10-CM | POA: Diagnosis not present

## 2020-01-02 DIAGNOSIS — K625 Hemorrhage of anus and rectum: Secondary | ICD-10-CM | POA: Diagnosis not present

## 2020-01-02 DIAGNOSIS — Z7901 Long term (current) use of anticoagulants: Secondary | ICD-10-CM

## 2020-01-02 DIAGNOSIS — R200 Anesthesia of skin: Secondary | ICD-10-CM

## 2020-01-02 NOTE — Progress Notes (Signed)
Subjective  CC:  Chief Complaint  Patient presents with  . Blood In Stools    just started. has had blood in mouth for about 6 months    Same day acute visit; PCP not available. New pt to me. Chart reviewed.   HPI: Cathy Bell is a 59 y.o. female who presents to the office today to address the problems listed above in the chief complaint.  59 yo female w/ h/o MI on asa and now plavix (had been on brillinta) with several complaints. '  C/o 6 months of intermittent gum bleeding and numbness/tingling around her lips and in her mouth and throat. She reports at times her jaw seems to be going in two different directions. She has dry mouth and at times feels like her airway is blocked "like wax paper" sitting over it. Yesterday evening, while eating tomatoes and zucchini she had a sudden sensation of her mouth and throat swelling. She reports she measured her upper neck and it was an inch larger than it is now. Her sxs lasted about 15 minutes. She never felt like she couldn't breathe. She denies lip or tongue swelling. No rash. No new foods.   C/o 6 months of abdominal swelling: feels like her insides are pushing their way out. Sought help from GYN w/o clear problem identified.   Had BRBPR on TP this am so wants to know if this is all connected.  ROS: neg for palpitations, cp, sob, sinus or facial pain, headaches, panic, abdominal pain, change in appetite, GERD, n/v/d or constipation or urinary sxs  ROS: + intermittent cough, occ PND, itchy feeling on face, and need to clear her throat. She has been more anxious about her overall health since her heart attack but no h/o mood or panic d/o.   CRC screen is current. I reviewed recent note from Dr. Edison Nasuti who also addressed the abdominal protrusion symptoms.   Assessment  1. Throat swelling   2. Perioral numbness   3. Abdominal swelling   4. Bright red blood per rectum   5. Anticoagulated      Plan   Vague symptom complex:  I don't  see any emergent findings or sxs today. Discussed angioedema and SOB and instructed to take benadryl and go to ER if this happens.   Mouth and throat sxs could be related to allergies: rec starting nightly zyrtec. May warrant ENT or Allergy referral if persist. She will f/u with PCP  Bleeding gums and one episode of blood on TP: due to blood thinners. Monitor. No further evaluation but IF blood in stool, then would f/u with Dr. Ardis Hughs. Check blood work today.   Abdominal swelling: not certain about cause but don't see link to oral sxs: f/u with PCP.  Follow up: with Dr. Yong Channel next available  Visit date not found  Orders Placed This Encounter  Procedures  . CBC with Differential/Platelet  . TSH  . Comprehensive metabolic panel   No orders of the defined types were placed in this encounter.     I reviewed the patients updated PMH, FH, and SocHx.    Patient Active Problem List   Diagnosis Date Noted  . OSA (obstructive sleep apnea) 08/19/2018  . CAD (coronary artery disease) 04/22/2018  . Unstable angina ([place])   . Hot flashes due to menopause 12/08/2017  . History of gestational diabetes 11/03/2017  . Brain lesion 11/03/2017  . Multiple thyroid nodules 11/03/2017  . Rosacea 11/03/2017  . Overweight (BMI 25.0-29.9)  11/03/2017  . CAD S/P percutaneous coronary angioplasty 10/20/2017  . Exertional chest pain 10/20/2017  . Familial hyperlipidemia   . Acute ST elevation myocardial infarction (STEMI) of inferior wall ([place]) 09/26/2017  . FATIGUE, CHRONIC 03/10/2007   Current Meds  Medication Sig  . acetaminophen (TYLENOL) 500 MG tablet Take 500-1,000 mg by mouth every 6 (six) hours as needed (for pain.).  Marland Kitchen aspirin 81 MG chewable tablet Chew 1 tablet (81 mg total) by mouth daily. (Patient taking differently: Chew 81 mg by mouth daily at 6 (six) AM. 0630)  . atorvastatin (LIPITOR) 80 MG tablet TAKE 1 TABLET (80 MG TOTAL) BY MOUTH DAILY AT 6 PM.  . Camphor-Eucalyptus-Menthol (VICKS  CASERO EX) Apply 1 application topically.  . Cholecalciferol (VITAMIN D3) 5000 units TABS Take 5,000 Units by mouth 3 (three) times a week. 0630   . clopidogrel (PLAVIX) 75 MG tablet Take 1 tablet (75 mg total) by mouth daily.  Marland Kitchen CRANBERRY PO Take 3 capsules by mouth 3 (three) times daily as needed (for urinary tract infection symptoms.).  Marland Kitchen D-Mannose 500 MG CAPS Take 500 mg by mouth 3 (three) times daily as needed (for urinary tract infection symptoms.).  Marland Kitchen ezetimibe (ZETIA) 10 MG tablet TAKE 1 TABLET BY MOUTH EVERY DAY  . loratadine (CLARITIN) 10 MG tablet Take 10 mg by mouth daily as needed for allergies.  . nitroGLYCERIN (NITROSTAT) 0.4 MG SL tablet Place 1 tablet (0.4 mg total) under the tongue every 5 (five) minutes x 3 doses as needed for chest pain.  . Omega-3 Fatty Acids (SUPER OMEGA-3) 1000 MG CAPS Take 1,000 mg by mouth 2 (two) times daily. 0630   . Probiotic Product (PROBIOTIC ADVANCED PO) Take 1 capsule by mouth daily at 6 (six) AM. (0630) Suprema Dophilus  . sodium chloride (MURO 128) 5 % ophthalmic ointment Place 1 application into both eyes at bedtime.  . sodium chloride (MURO 128) 5 % ophthalmic solution Place 1 drop into both eyes 3 (three) times daily.     Allergies: Patient is allergic to cefprozil, cheese, peanut-containing drug products, prednisolone, salmon [fish allergy], and wellbutrin [bupropion hcl]. Family History: Patient family history includes Atrial fibrillation in her father; Breast cancer in her sister; Colon cancer in her father; Diabetes in her maternal grandfather; Hashimoto's thyroiditis in her sister; Hyperlipidemia in her mother; Other in her mother; Stroke in her son. Social History:  Patient  reports that she has never smoked. She has never used smokeless tobacco. She reports that she does not drink alcohol and does not use drugs.  Review of Systems: Constitutional: Negative for fever malaise or anorexia Cardiovascular: negative for chest  pain Respiratory: negative for SOB or persistent cough Gastrointestinal: negative for abdominal pain  Objective  Vitals: BP 102/60   Pulse 77   Temp 98.5 F (36.9 C) (Temporal)   Ht 5\' 2"  (1.575 m)   Wt 159 lb (72.1 kg)   LMP 03/18/2011   SpO2 98%   BMI 29.08 kg/m  General: no acute distress , A&Ox3, does have dry cough several times throughout interview HEENT: PEERL, conjunctiva normal, neck is supple, TMs nl, OP with petechiae on hard palate, otherwise normal. No cervical LAD, no lip, tongue or facial or neck swelling noted. No thyromegaly.  Cardiovascular:  RRR without murmur or gallop.  Respiratory:  Good breath sounds bilaterally, CTAB with normal respiratory effort Gastrointestinal: soft, flat abdomen, normal active bowel sounds, no palpable masses, no hepatosplenomegaly, no appreciated hernias, Skin:  Warm, no rashes No LE  edema.    Commons side effects, risks, benefits, and alternatives for medications and treatment plan prescribed today were discussed, and the patient expressed understanding of the given instructions. Patient is instructed to call or message via MyChart if he/she has any questions or concerns regarding our treatment plan. No barriers to understanding were identified. We discussed Red Flag symptoms and signs in detail. Patient expressed understanding regarding what to do in case of urgent or emergency type symptoms.   Medication list was reconciled, printed and provided to the patient in AVS. Patient instructions and summary information was reviewed with the patient as documented in the AVS. This note was prepared with assistance of Dragon voice recognition software. Occasional wrong-word or sound-a-like substitutions may have occurred due to the inherent limitations of voice recognition software  This visit occurred during the SARS-CoV-2 public health emergency.  Safety protocols were in place, including screening questions prior to the visit, additional usage  of staff PPE, and extensive cleaning of exam room while observing appropriate contact time as indicated for disinfecting solutions.

## 2020-01-02 NOTE — Patient Instructions (Signed)
Please return to see Dr. Yong Channel to discuss further your symptoms. I will send him my note Please start OTC zyrtec 10mg  nightly.   I will release your lab results to you on your MyChart account with further instructions. Please reply with any questions.   If you have any questions or concerns, please don't hesitate to send me a message via MyChart or call the office at [phone]. Thank you for visiting with Cathy Bell today! It's our pleasure caring for you.

## 2020-01-03 LAB — COMPREHENSIVE METABOLIC PANEL
ALT: 19 U/L (ref 0–35)
AST: 18 U/L (ref 0–37)
Albumin: 4.7 g/dL (ref 3.5–5.2)
Alkaline Phosphatase: 75 U/L (ref 39–117)
BUN: 12 mg/dL (ref 6–23)
CO2: 29 mEq/L (ref 19–32)
Calcium: 9.4 mg/dL (ref 8.4–10.5)
Chloride: 103 mEq/L (ref 96–112)
Creatinine, Ser: 0.68 mg/dL (ref 0.40–1.20)
GFR: 88.25 mL/min (ref >60.00)
Glucose, Bld: 98 mg/dL (ref 70–99)
Potassium: 3.9 mEq/L (ref 3.5–5.1)
Sodium: 138 mEq/L (ref 135–145)
Total Bilirubin: 1 mg/dL (ref 0.2–1.2)
Total Protein: 6.8 g/dL (ref 6.0–8.3)

## 2020-01-03 LAB — CBC WITH DIFFERENTIAL/PLATELET
Basophils Absolute: 0.1 10*3/uL (ref 0.0–0.1)
Basophils Relative: 0.8 % (ref 0.0–3.0)
Eosinophils Absolute: 0.1 10*3/uL (ref 0.0–0.7)
Eosinophils Relative: 2.2 % (ref 0.0–5.0)
HCT: 38.5 % (ref 36.0–46.0)
Hemoglobin: 12.8 g/dL (ref 12.0–15.0)
Lymphocytes Relative: 23.5 % (ref 12.0–46.0)
Lymphs Abs: 1.5 10*3/uL (ref 0.7–4.0)
MCHC: 33.2 g/dL (ref 30.0–36.0)
MCV: 89 fl (ref 78.0–100.0)
Monocytes Absolute: 0.7 10*3/uL (ref 0.1–1.0)
Monocytes Relative: 11.3 % (ref 3.0–12.0)
Neutro Abs: 3.9 10*3/uL (ref 1.4–7.7)
Neutrophils Relative %: 62.2 % (ref 43.0–77.0)
Platelets: 158 10*3/uL (ref 150.0–400.0)
RBC: 4.33 Mil/uL (ref 3.87–5.11)
RDW: 13.2 % (ref 11.5–15.5)
WBC: 6.3 10*3/uL (ref 4.0–10.5)

## 2020-01-03 LAB — TSH: TSH: 2.29 u[IU]/mL (ref 0.35–4.50)

## 2020-01-13 ENCOUNTER — Ambulatory Visit: Payer: BC Managed Care – PPO | Admitting: Family Medicine

## 2020-02-21 IMAGING — CR DG CHEST 2V
2 series · 2 of 2 positions shown · non-contrast
Comparison: None.

CLINICAL DATA: Mid chest pain tonight

EXAM:
CHEST - 2 VIEW

[chest pa]
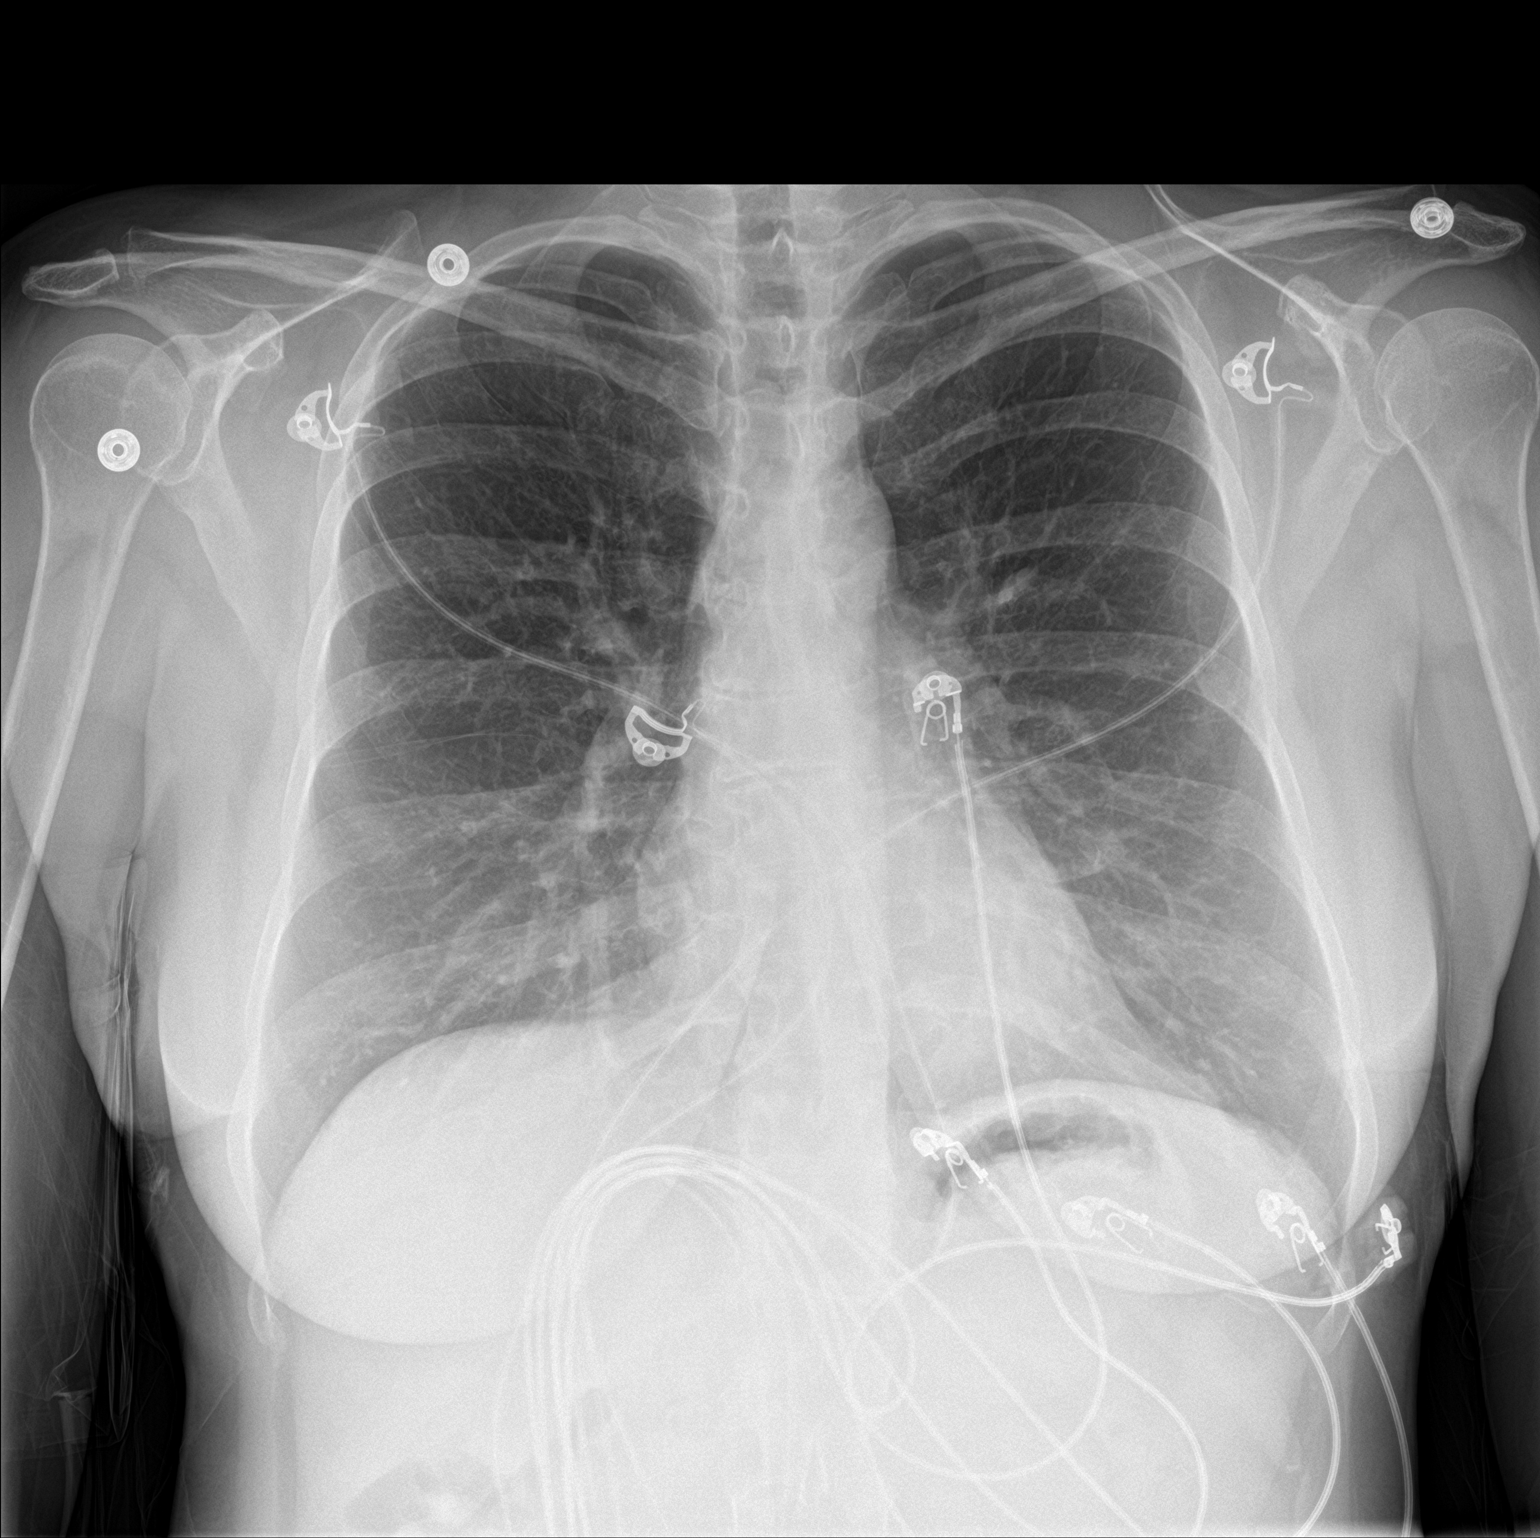

[chest lat]
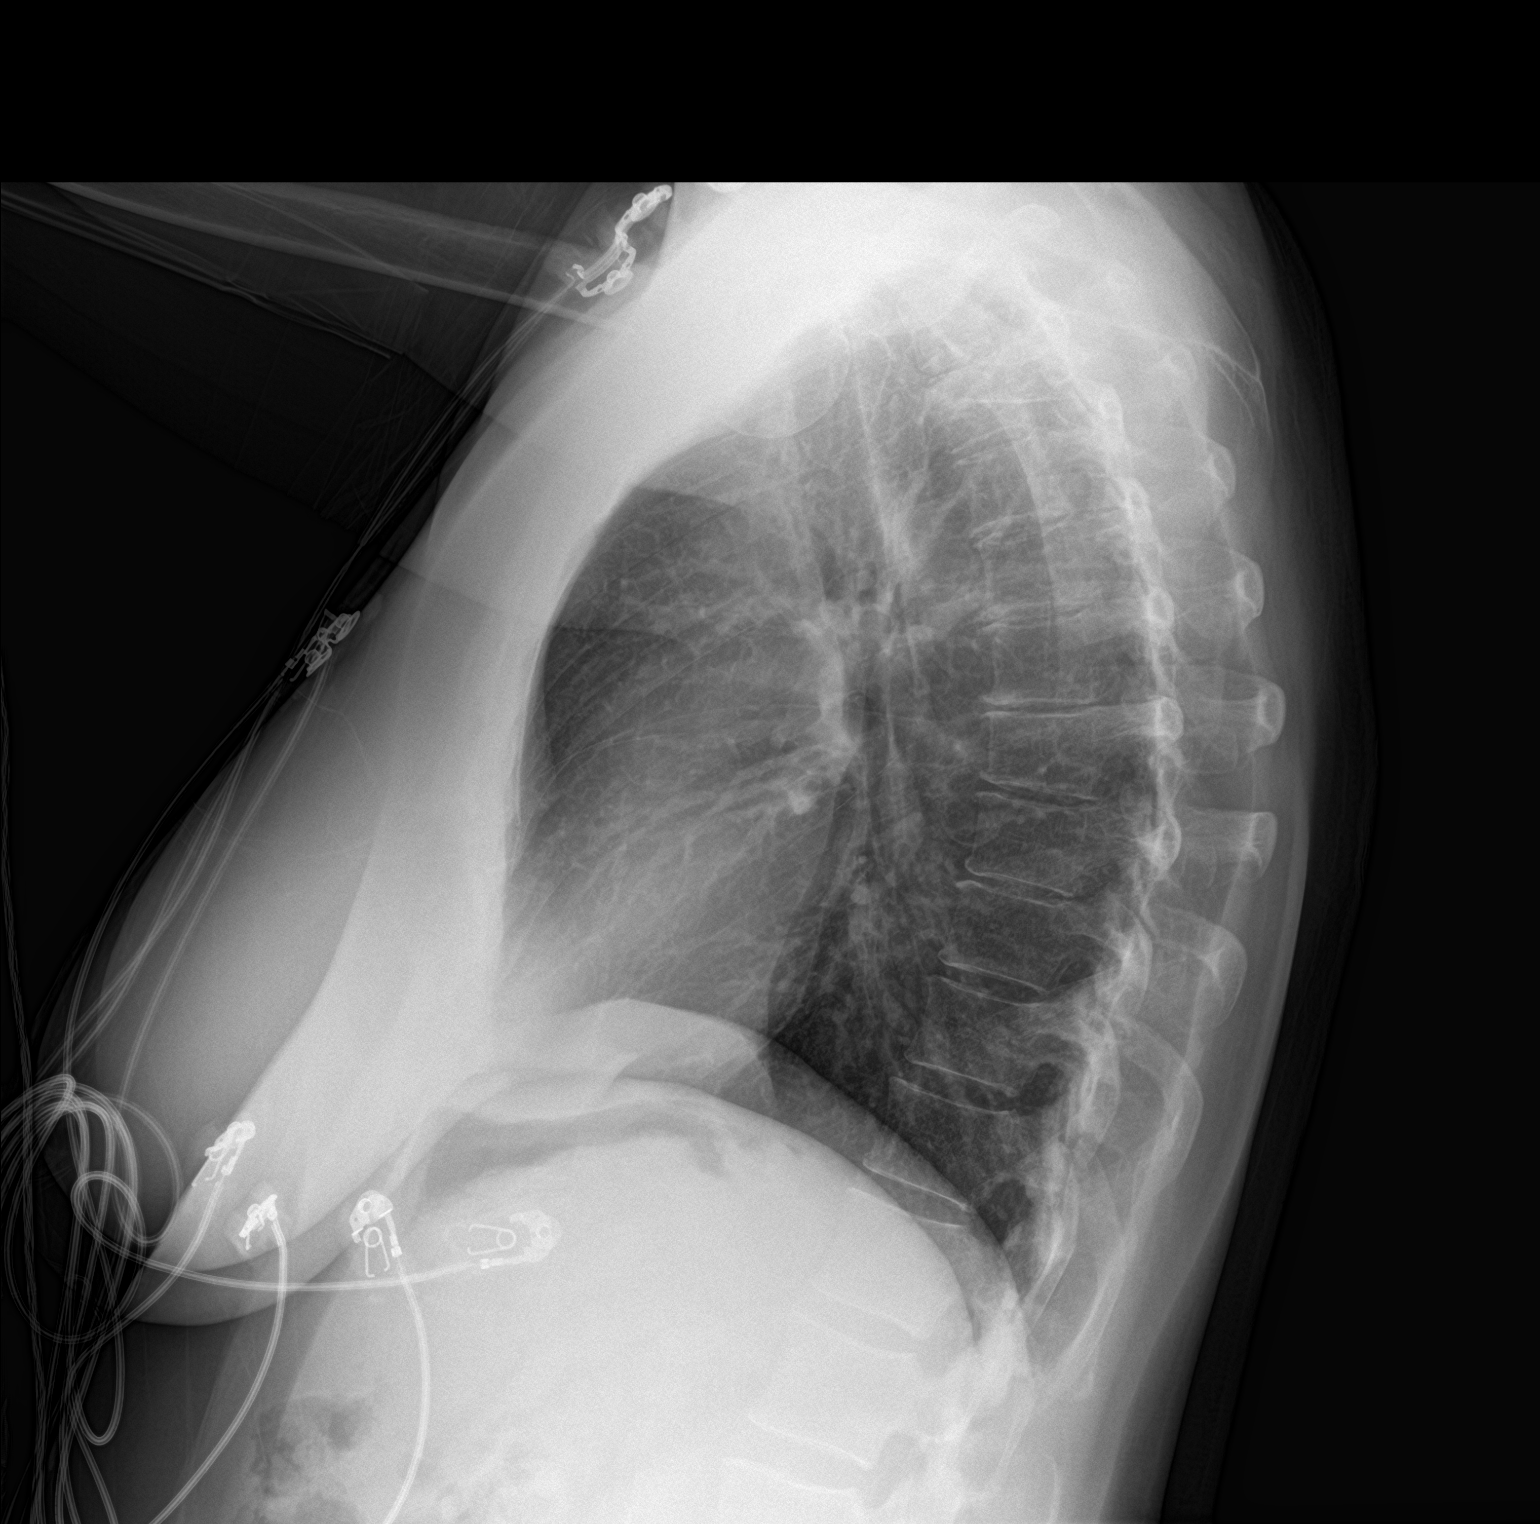

[2 of 2 positions shown; findings below may reference images not displayed]

FINDINGS: The heart size and mediastinal contours are within normal limits.
Both lungs are clear. The visualized skeletal structures are
unremarkable.
IMPRESSION: No active cardiopulmonary disease.

## 2020-03-05 ENCOUNTER — Other Ambulatory Visit: Payer: Self-pay | Admitting: Cardiovascular Disease

## 2020-04-13 ENCOUNTER — Ambulatory Visit: Payer: BC Managed Care – PPO | Admitting: Family Medicine

## 2020-04-29 IMAGING — MG DIGITAL SCREENING BILATERAL MAMMOGRAM WITH CAD
2 series · 2 of 2 positions shown · non-contrast
Comparison: Previous exam(s).

ACR Breast Density Category a: The breast tissue is almost entirely
fatty.

CLINICAL DATA: Screening.

EXAM:
DIGITAL SCREENING BILATERAL MAMMOGRAM WITH CAD

[R MLO]
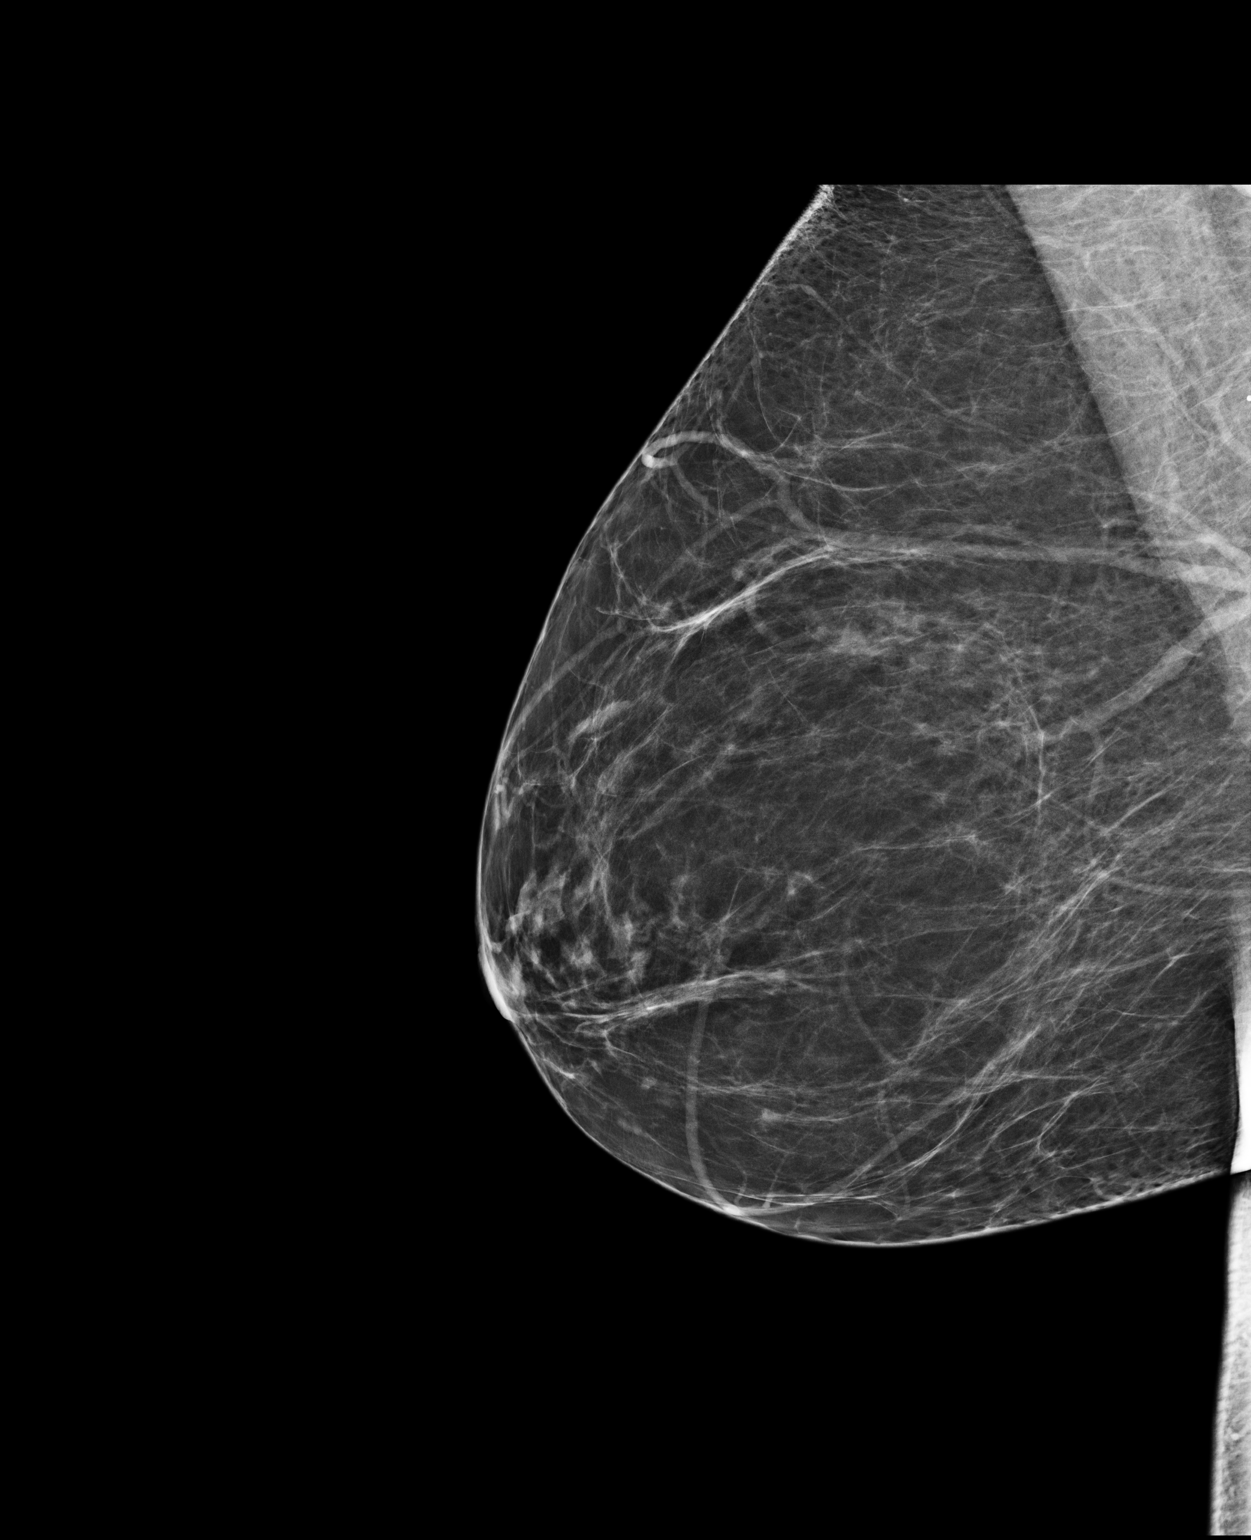

[L MLO]
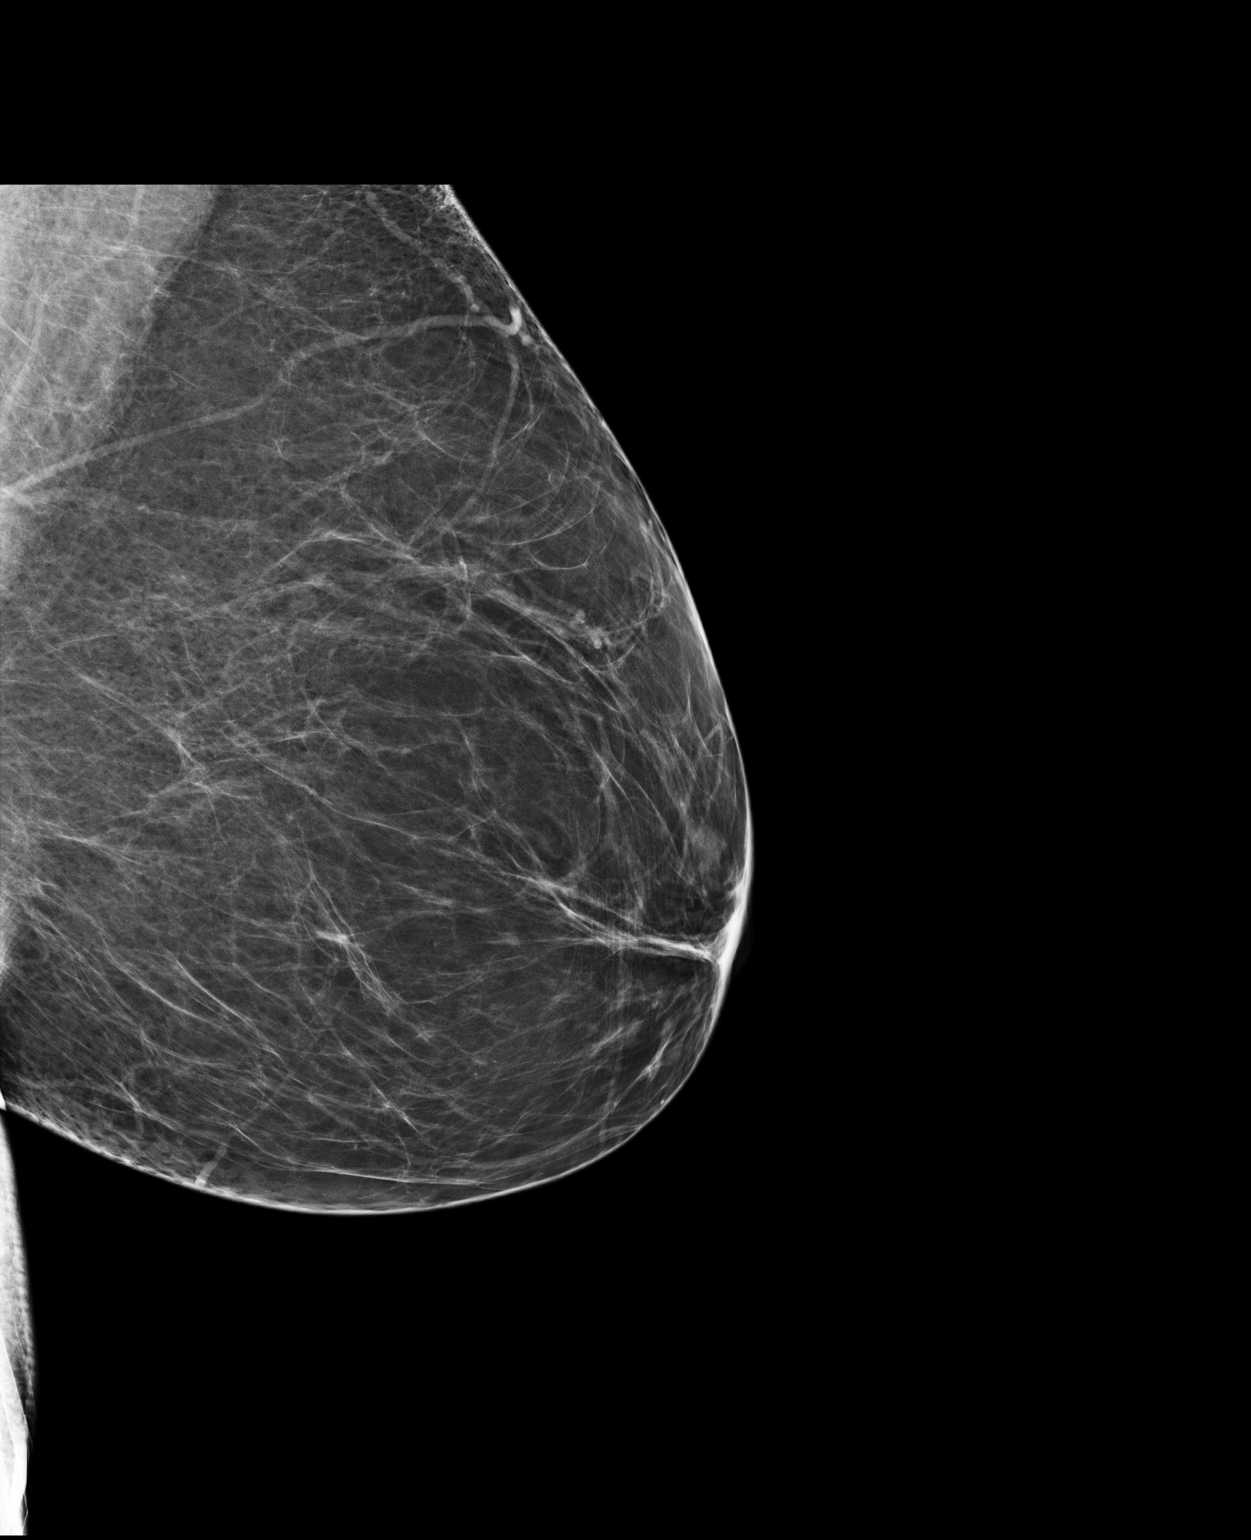

[2 of 2 positions shown; findings below may reference images not displayed]

FINDINGS: There are no findings suspicious for malignancy. Images were
processed with CAD.
IMPRESSION: No mammographic evidence of malignancy. A result letter of this
screening mammogram will be mailed directly to the patient.

RECOMMENDATION:
Screening mammogram in one year. (Code:[id number])

BI-RADS CATEGORY  1: Negative.

## 2020-05-16 ENCOUNTER — Encounter: Payer: Self-pay | Admitting: Family Medicine

## 2020-06-26 ENCOUNTER — Ambulatory Visit: Payer: BC Managed Care – PPO | Admitting: Family Medicine

## 2020-07-04 ENCOUNTER — Other Ambulatory Visit: Payer: Self-pay | Admitting: Cardiovascular Disease

## 2020-07-27 ENCOUNTER — Telehealth (INDEPENDENT_AMBULATORY_CARE_PROVIDER_SITE_OTHER): Payer: 59 | Admitting: Cardiology

## 2020-07-27 ENCOUNTER — Encounter: Payer: Self-pay | Admitting: Cardiology

## 2020-07-27 ENCOUNTER — Other Ambulatory Visit: Payer: Self-pay

## 2020-07-27 VITALS — BP 109/77 | HR 70 | Ht 63.0 in | Wt 166.0 lb

## 2020-07-27 DIAGNOSIS — G4733 Obstructive sleep apnea (adult) (pediatric): Secondary | ICD-10-CM

## 2020-07-27 NOTE — Patient Instructions (Addendum)

## 2020-07-27 NOTE — Progress Notes (Signed)
Virtual Visit via Video Note   This visit type was conducted due to national recommendations for restrictions regarding the COVID-19 Pandemic (e.g. social distancing) in an effort to limit this patient's exposure and mitigate transmission in our community.  Due to her co-morbid illnesses, this patient is at least at moderate risk for complications without adequate follow up.  This format is felt to be most appropriate for this patient at this time.  All issues noted in this document were discussed and addressed.  A limited physical exam was performed with this format.  Please refer to the patient's chart for her consent to telehealth for [facility].  Date:  07/27/2020   ID:  Cathy Bell, DOB 04-Apr-1961, MRN [id number] The patient was identified using 2 identifiers.  Patient Location: Home Provider Location: Office/Clinic   Cardiology Office Note:    Date:  07/27/2020   ID:  Cathy Bell, DOB June 06, 1961, MRN [id number]  PCP:  Marin Olp, MD  Cardiologist:  Quay Burow, MD    Referring MD: Marin Olp, MD   No chief complaint on file.   History of Present Illness:    Cathy Bell is a 60 y.o. female with a hx of a history of CAD, hyperlipidemia who was referred for sleep testing by Beckie Busing, NP.  Study showed mild obstructive sleep apnea with an AHI of 10.6/h and oxygen desaturations as low as 88%.  She was placed on auto CPAP titration.  She is doing well with her CPAP device and thinks that she has gotten used to it.  She tolerates the mask and feels the pressure is adequate.  Since going on CPAP she feels rested in the am and has no significant daytime sleepiness.  She denies any significant mouth or nasal dryness or nasal congestion.  She does not think that he snores.     Past Medical History:  Diagnosis Date  . Acute ST elevation myocardial infarction (STEMI) of inferior wall ([place]) 09/26/2017   Pt presented 09/26/17 with an acute  inferior STEMI. Door to balloon time 30 minutes.   . Coronary artery disease   . Distended abdomen   . Heart disease   . History of Epstein-Barr virus infection   . Hyperlipidemia   . IBS (irritable bowel syndrome)   . OSA (obstructive sleep apnea) 08/19/2018    mild obstructive sleep apnea with an AHI of 10.6/h and oxygen desaturations as low as 88%.  . Sleep apnea 2007   uses c pap on occasion    Past Surgical History:  Procedure Laterality Date  . CARDIAC CATHETERIZATION    . CORONARY ANGIOPLASTY WITH STENT PLACEMENT  04/22/2018  . CORONARY STENT INTERVENTION N/A 04/22/2018   Procedure: CORONARY STENT INTERVENTION;  Surgeon: Lorretta Harp, MD;  Location: [place] [facility];  Service: Cardiovascular;  Laterality: N/A;  . CORONARY/GRAFT ACUTE MI REVASCULARIZATION N/A 09/26/2017   Procedure: Coronary/Graft Acute MI Revascularization;  Surgeon: Lorretta Harp, MD;  Location: [place] [facility];  Service: Cardiovascular;  Laterality: N/A;  . LEFT HEART CATH AND CORONARY ANGIOGRAPHY N/A 09/26/2017   Procedure: LEFT HEART CATH AND CORONARY ANGIOGRAPHY;  Surgeon: Lorretta Harp, MD;  Location: [place] [facility];  Service: Cardiovascular;  Laterality: N/A;  . nodulectomy     Salzmann Nodular Degeneration  . STENT PLACEMENT VASCULAR ([place] HX)  2019  . TONSILLECTOMY AND ADENOIDECTOMY  1967  . WISDOM TOOTH EXTRACTION  1981    Current Medications: Current Meds  Medication Sig  . acetaminophen (TYLENOL) 500 MG tablet Take 500-1,000 mg by mouth every 6 (six) hours as needed (for pain.).  Marland Kitchen aspirin 81 MG chewable tablet Chew 1 tablet (81 mg total) by mouth daily. (Patient taking differently: Chew 81 mg by mouth daily at 6 (six) AM. 0630)  . atorvastatin (LIPITOR) 80 MG tablet TAKE 1 TABLET (80 MG TOTAL) BY MOUTH DAILY AT 6 PM.  . Camphor-Eucalyptus-Menthol (VICKS CASERO EX) Apply 1 application topically.  . Cholecalciferol (VITAMIN D3) 5000 units TABS Take 5,000 Units by mouth 3  (three) times a week. 0630  . clopidogrel (PLAVIX) 75 MG tablet Take 1 tablet (75 mg total) by mouth daily.  Marland Kitchen CRANBERRY PO Take 3 capsules by mouth 3 (three) times daily as needed (for urinary tract infection symptoms.).  Marland Kitchen D-Mannose 500 MG CAPS Take 500 mg by mouth 3 (three) times daily as needed (for urinary tract infection symptoms.).  Marland Kitchen ezetimibe (ZETIA) 10 MG tablet TAKE 1 TABLET BY MOUTH EVERY DAY  . loratadine (CLARITIN) 10 MG tablet Take 10 mg by mouth daily as needed for allergies.  . nitroGLYCERIN (NITROSTAT) 0.4 MG SL tablet Place 1 tablet (0.4 mg total) under the tongue every 5 (five) minutes x 3 doses as needed for chest pain.  . Omega-3 Fatty Acids (SUPER OMEGA-3) 1000 MG CAPS Take 1,000 mg by mouth 2 (two) times daily. 0630  . Probiotic Product (PROBIOTIC ADVANCED PO) Take 1 capsule by mouth daily at 6 (six) AM. (0630) Suprema Dophilus  . sodium chloride (MURO 128) 5 % ophthalmic ointment Place 1 application into both eyes at bedtime.  . sodium chloride (MURO 128) 5 % ophthalmic solution Place 1 drop into both eyes 3 (three) times daily.     Allergies:   Cefprozil, Cheese, Peanut-containing drug products, Prednisolone, Salmon [fish allergy], and Wellbutrin [bupropion hcl]   Social History   Socioeconomic History  . Marital status: Divorced    Spouse name: Not on file  . Number of children: 2  . Years of education: Not on file  . Highest education level: Master's degree (e.g., MA, MS, MEng, MEd, MSW, MBA)  Occupational History  . Occupation: Optometrist  Tobacco Use  . Smoking status: Never Smoker  . Smokeless tobacco: Never Used  Vaping Use  . Vaping Use: Never used  Substance and Sexual Activity  . Alcohol use: No  . Drug use: No  . Sexual activity: Not Currently  Other Topics Concern  . Not on file  Social History Narrative   Divorced. 2 sons [name] 26 and [name]. No grandkids yet.       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 Graduate school at [employer]: walking, surface design- Financial planner, play mahjong, movies, reading, travel   Social Determinants of Radio broadcast assistant Strain: Not on file  Food Insecurity: Not on file  Transportation Needs: Not on file  Physical Activity: Not on file  Stress: Not on file  Social Connections: Not on file     Family History: The patient's family history includes Atrial fibrillation in her father; Breast cancer in her sister; Colon cancer in her father; Diabetes in her maternal grandfather; Hashimoto's thyroiditis in her sister; Hyperlipidemia in her mother; Other in her mother; Stroke in her son.  ROS:   Please see the history of present illness.  ROS  All other systems reviewed and negative.   EKGs/Labs/Other Studies Reviewed:    The following studies were reviewed today: PAP download  EKG:  EKG is not ordered today.    Recent Labs: 01/02/2020: ALT 19; BUN 12; Creatinine, Ser 0.68; Hemoglobin 12.8; Platelets 158.0; Potassium 3.9; Sodium 138; TSH 2.29   Recent Lipid Panel    Component Value Date/Time   CHOL 114 11/11/2019 0824   TRIG 84 11/11/2019 0824   HDL 41 11/11/2019 0824   CHOLHDL 2.8 11/11/2019 0824   CHOLHDL 4.6 09/26/2017 2334   VLDL 12 09/26/2017 2334   LDLCALC 56 11/11/2019 0824   LDLDIRECT 91.0 11/03/2017 1451    Physical Exam:    VS:  BP 109/77   Pulse 70   Ht 5\' 3"  (1.6 m)   Wt 166 lb (75.3 kg)   LMP 03/18/2011   BMI 29.41 kg/m     Wt Readings from Last 3 Encounters:  07/27/20 166 lb (75.3 kg)  01/02/20 159 lb (72.1 kg)  12/13/19 162 lb 12.8 oz (73.8 kg)     Well nourished, well developed female in no acute distress. Well appearing, alert and conversant, regular work of breathing,  good skin color  Eyes- anicteric mouth- oral mucosa is pink  neuro- grossly intact skin- no apparent rash or lesions or  cyanosis   ASSESSMENT:    1. OSA (obstructive sleep apnea)    PLAN:    In order of problems listed above:  1.  OSA -  The patient is tolerating PAP therapy well without any problems. The PAP download was reviewed today and showed an AHI of 1.2/hr on auto PAP cm H2O with 100% compliance in using more than 4 hours nightly.  The patient has been using and benefiting from PAP use and will continue to benefit from therapy.   COVID-19 Education: The signs and symptoms of COVID-19 were discussed with the patient and how to seek care for testing (follow up with PCP or arrange E-visit).  The importance of social distancing was discussed today.  Time:   Today, I have spent 15 minutes with the patient with telehealth technology discussing the above problems.   No orders of the defined types were placed in this encounter.   Medication Adjustments/Labs and Tests Ordered: Current medicines are reviewed at length with the patient today.  Concerns regarding medicines are outlined above.  No orders of the defined types were placed in this encounter.  No orders of the defined types were placed in this encounter.   Signed, Fransico Him, MD  07/27/2020 8:14 AM    [place]

## 2020-07-31 ENCOUNTER — Telehealth: Payer: BC Managed Care – PPO | Admitting: Family Medicine

## 2020-10-01 DIAGNOSIS — G4733 Obstructive sleep apnea (adult) (pediatric): Secondary | ICD-10-CM

## 2020-11-16 ENCOUNTER — Ambulatory Visit: Payer: 59 | Admitting: Cardiovascular Disease

## 2020-11-21 ENCOUNTER — Encounter: Payer: Self-pay | Admitting: Cardiovascular Disease

## 2020-11-21 ENCOUNTER — Ambulatory Visit: Payer: 59 | Admitting: Cardiovascular Disease

## 2020-11-21 ENCOUNTER — Other Ambulatory Visit: Payer: Self-pay

## 2020-11-21 VITALS — BP 90/64 | HR 67 | Ht 63.0 in | Wt 169.0 lb

## 2020-11-21 DIAGNOSIS — Z9861 Coronary angioplasty status: Secondary | ICD-10-CM

## 2020-11-21 DIAGNOSIS — G4733 Obstructive sleep apnea (adult) (pediatric): Secondary | ICD-10-CM | POA: Diagnosis not present

## 2020-11-21 DIAGNOSIS — I2510 Atherosclerotic heart disease of native coronary artery without angina pectoris: Secondary | ICD-10-CM

## 2020-11-21 DIAGNOSIS — I2119 ST elevation (STEMI) myocardial infarction involving other coronary artery of inferior wall: Secondary | ICD-10-CM | POA: Diagnosis not present

## 2020-11-21 DIAGNOSIS — E7849 Other hyperlipidemia: Secondary | ICD-10-CM | POA: Diagnosis not present

## 2020-11-21 LAB — HEPATIC FUNCTION PANEL
ALT: 24 IU/L (ref 0–32)
AST: 18 IU/L (ref 0–40)
Albumin: 4.5 g/dL (ref 3.8–4.9)
Alkaline Phosphatase: 84 IU/L (ref 44–121)
Bilirubin Total: 0.6 mg/dL (ref 0.0–1.2)
Bilirubin, Direct: 0.17 mg/dL (ref 0.00–0.40)
Total Protein: 6.4 g/dL (ref 6.0–8.5)

## 2020-11-21 LAB — LIPID PANEL
Chol/HDL Ratio: 2.6 ratio (ref 0.0–4.4)
Cholesterol, Total: 127 mg/dL (ref 100–199)
HDL: 49 mg/dL (ref >39)
LDL Chol Calc (NIH): 63 mg/dL (ref 0–99)
Triglycerides: 75 mg/dL (ref 0–149)
VLDL Cholesterol Cal: 15 mg/dL (ref 5–40)

## 2020-11-21 NOTE — Assessment & Plan Note (Signed)
History of hyperlipidemia on high-dose atorvastatin and Zetia lipid profile performed 11/11/2019 revealing total cholesterol 114, LDL 56 and HDL of 41.  We will recheck a lipid and liver profile this morning.

## 2020-11-21 NOTE — Assessment & Plan Note (Signed)
History of CAD status post inferior STEMI 09/26/2017 while walking [place] country [place].  Her EKG showed inferior ST segment elevation with reciprocal anterior depression.  I brought her to the Cath Lab on a Saturday morning and opened up an occluded mid RCA with a Synergy drug-eluting stent.  She did have inferobasal hypokinesia which improved by 2D echo the following day.  She had a 80% mid LAD lesion with subsequent negative Myoview stress test.  At that point I elected to treat her medically.  Because of progressive chest pressure and a CTA performed 03/26/2018 I performed cardiac catheterization on her 04/22/2018 and stented her mid LAD with a drug-eluting stent.  I dilated her diagonal branch through the stent struts.  Her RCA stent was widely patent.  Symptoms markedly improved.  She remains on aspirin Plavix.  She denies chest pain or shortness of breath.

## 2020-11-21 NOTE — Assessment & Plan Note (Signed)
History of obstructive sleep apnea on CPAP followed by Dr. Radford Pax.

## 2020-11-21 NOTE — Progress Notes (Signed)
11/21/2020 Cathy Bell   04/03/1961  [id number]  Primary Physician Cathy Channel Brayton Mars, MD Primary Cardiologist: Cathy Harp MD FACP, [place], [place], [place]  HPI:  Cathy Bell is a 60 y.o.  moderately overweight divorced Caucasian femalewho I last saw  saw in the office 11/11/2019. She works as a Pharmacist, hospital.  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 She presented on 09/26/2017 with chest pain while walking in the park. EKG showed inferior ST segment elevation with reciprocal anterior depression. I brought her to the Cath Lab on Saturday morning and opened up an occluded mid RCA with a synergy drug-eluting stent. She did have inferobasal hypokinesia which improved by 2D echo the following day. She did have an 80% mid LAD lesion with a subsequent negative Myoview stress test. Her major complaints are of fatigue. Her other problems include hyperlipidemia on high-dose statin therapy with mildly elevated liver function tests. She is on dual antiplatelet therapy with Brilinta.  She  had progressive chest pressure especially when walking up an incline. A recent coronary CTA performed 03/26/2018 confirmed a moderate mid LAD lesion but unfortunately FFR could not be performed. Recently progressive symptoms of decided to proceed with elective outpatient LAD intervention. I performed outpatient coronary intervention on her 04/22/2018 with a drug-eluting stent placed in the mid LAD. I also dilated her diagonal branch ostium through the stent struts. Her RCA stent was patent at that time. Her symptoms are markedly improved.  Since I saw her  in the office a year ago she continues to do well.  She gets occasional mild shortness of breath while walking up a hill but for the most part denies chest pain or shortness of breath.  She remains on aspirin and clopidogrel.  She does have  obstructive sleep apnea on CPAP followed by Dr. Radford Bell.   Current Meds  Medication Sig  . acetaminophen (TYLENOL) 500 MG tablet Take 500-1,000 mg by mouth every 6 (six) hours as needed (for pain.).  Marland Kitchen aspirin 81 MG chewable tablet Chew 1 tablet (81 mg total) by mouth daily. (Patient taking differently: Chew 81 mg by mouth daily at 6 (six) AM. 0630)  . atorvastatin (LIPITOR) 80 MG tablet TAKE 1 TABLET (80 MG TOTAL) BY MOUTH DAILY AT 6 PM.  . Camphor-Eucalyptus-Menthol (VICKS CASERO EX) Apply 1 application topically.  . Cholecalciferol (VITAMIN D3) 5000 units TABS Take 5,000 Units by mouth 3 (three) times a week. 0630  . clopidogrel (PLAVIX) 75 MG tablet Take 1 tablet (75 mg total) by mouth daily.  Marland Kitchen CRANBERRY PO Take 3 capsules by mouth 3 (three) times daily as needed (for urinary tract infection symptoms.).  Marland Kitchen D-Mannose 500 MG CAPS Take 500 mg by mouth 3 (three) times daily as needed (for urinary tract infection symptoms.).  Marland Kitchen ezetimibe (ZETIA) 10 MG tablet TAKE 1 TABLET BY MOUTH EVERY DAY  . loratadine (CLARITIN) 10 MG tablet Take 10 mg by mouth daily as needed for allergies.  . nitroGLYCERIN (NITROSTAT) 0.4 MG SL tablet Place 1 tablet (0.4 mg total) under the tongue every 5 (five) minutes x 3 doses as needed for chest pain.  . Omega-3 Fatty Acids (SUPER OMEGA-3) 1000 MG CAPS Take 1,000 mg by mouth 2 (two) times daily. 0630  . Probiotic Product (PROBIOTIC ADVANCED PO) Take 1 capsule by mouth daily at 6 (six) AM. (0630) Suprema Dophilus  . sodium chloride (  MURO 128) 5 % ophthalmic ointment Place 1 application into both eyes at bedtime.  . sodium chloride (MURO 128) 5 % ophthalmic solution Place 1 drop into both eyes 3 (three) times daily.     Allergies  Allergen Reactions  . Cefprozil Hives and Other (See Comments)  . Cheese Hives  . Peanut-Containing Drug Products Hives  . Prednisolone     Caused dangerously high eye pressure around time of eye surgery  . Salmon [Fish Allergy] Hives  .  Wellbutrin [Bupropion Hcl] Hives    Social History   Socioeconomic History  . Marital status: Divorced    Spouse name: Not on file  . Number of children: 2  . Years of education: Not on file  . Highest education level: Master's degree (e.g., MA, MS, MEng, MEd, MSW, MBA)  Occupational History  . Occupation: Optometrist  Tobacco Use  . Smoking status: Never Smoker  . Smokeless tobacco: Never Used  Vaping Use  . Vaping Use: Never used  Substance and Sexual Activity  . Alcohol use: No  . Drug use: No  . Sexual activity: Not Currently  Other Topics Concern  . Not on file  Social History Narrative   Divorced. 2 sons Cathy Bell 26 and Cathy Bell. No grandkids yet.       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 Graduate school at [employer]: walking, surface design- Financial planner, play mahjong, movies, reading, travel   Social Determinants of Radio broadcast assistant Strain: Not on file  Food Insecurity: Not on file  Transportation Needs: Not on file  Physical Activity: Not on file  Stress: Not on file  Social Connections: Not on file  Intimate Partner Violence: Not on file     Review of Systems: General: negative for chills, fever, night sweats or weight changes.  Cardiovascular: negative for chest pain, dyspnea on exertion, edema, orthopnea, palpitations, paroxysmal nocturnal dyspnea or shortness of breath Dermatological: negative for rash Respiratory: negative for cough or wheezing Urologic: negative for hematuria Abdominal: negative for nausea, vomiting, diarrhea, bright red blood per rectum, melena, or hematemesis Neurologic: negative for visual changes, syncope, or dizziness All other systems reviewed and are otherwise negative except as noted above.    Blood pressure 90/64, pulse 67, height 5\' 3"  (1.6 m), weight 169 lb (76.7 kg), last menstrual  period 03/18/2011.  General appearance: alert and no distress Neck: no adenopathy, no carotid bruit, no JVD, supple, symmetrical, trachea midline and thyroid not enlarged, symmetric, no tenderness/mass/nodules Lungs: clear to auscultation bilaterally Heart: regular rate and rhythm, S1, S2 normal, no murmur, click, rub or gallop Extremities: extremities normal, atraumatic, no cyanosis or edema Pulses: 2+ and symmetric Skin: Skin color, texture, turgor normal. No rashes or lesions Neurologic: Alert and oriented X 3, normal strength and tone. Normal symmetric reflexes. Normal coordination and gait  EKG sinus rhythm at 67 with inferior Q waves and low limb voltage.  I personally reviewed this EKG.  ASSESSMENT AND PLAN:   Acute ST elevation myocardial infarction (STEMI) of inferior wall (HCC) History of CAD status post inferior STEMI 09/26/2017 while walking in country [place].  Her EKG showed inferior ST segment elevation with reciprocal anterior depression.  I brought her to the Cath Lab on a Saturday morning and opened up an occluded mid RCA with a Synergy drug-eluting stent.  She did have inferobasal  hypokinesia which improved by 2D echo the following day.  She had a 80% mid LAD lesion with subsequent negative Myoview stress test.  At that point I elected to treat her medically.  Because of progressive chest pressure and a CTA performed 03/26/2018 I performed cardiac catheterization on her 04/22/2018 and stented her mid LAD with a drug-eluting stent.  I dilated her diagonal branch through the stent struts.  Her RCA stent was widely patent.  Symptoms markedly improved.  She remains on aspirin Plavix.  She denies chest pain or shortness of breath.  Familial hyperlipidemia History of hyperlipidemia on high-dose atorvastatin and Zetia lipid profile performed 11/11/2019 revealing total cholesterol 114, LDL 56 and HDL of 41.  We will recheck a lipid and liver profile this morning.  OSA (obstructive sleep  apnea) History of obstructive sleep apnea on CPAP followed by Dr. Radford Bell.      Cathy Harp MD FACP,FACC,FAHA, [facility] 11/21/2020 9:39 AM

## 2020-11-21 NOTE — Patient Instructions (Signed)

## 2020-11-26 ENCOUNTER — Other Ambulatory Visit: Payer: Self-pay | Admitting: Obstetrics & Gynecology

## 2020-11-26 DIAGNOSIS — Z1231 Encounter for screening mammogram for malignant neoplasm of breast: Secondary | ICD-10-CM

## 2020-11-27 ENCOUNTER — Other Ambulatory Visit: Payer: Self-pay | Admitting: Cardiovascular Disease

## 2020-12-06 ENCOUNTER — Encounter: Payer: Self-pay | Admitting: Family Medicine

## 2020-12-11 ENCOUNTER — Encounter: Payer: Self-pay | Admitting: Family Medicine

## 2020-12-11 ENCOUNTER — Telehealth (INDEPENDENT_AMBULATORY_CARE_PROVIDER_SITE_OTHER): Payer: 59 | Admitting: Family Medicine

## 2020-12-11 DIAGNOSIS — R197 Diarrhea, unspecified: Secondary | ICD-10-CM | POA: Diagnosis not present

## 2020-12-11 DIAGNOSIS — R110 Nausea: Secondary | ICD-10-CM

## 2020-12-11 NOTE — Patient Instructions (Addendum)
-  drink plenty of fluids  -avoid dairy and and red meat  -can use imodium if any further diarrhea  -please schedule an inperson visit with Dr. Yong Channel about the chronic issues   I hope you are feeling better soon!  Seek in person care promptly if your symptoms worsen, new concerns arise or you are not improving with treatment.  It was nice to meet you today. I help [facility] out with telemedicine visits on Tuesdays and Thursdays and am available for visits on those days. If you have any concerns or questions following this visit please schedule a follow up visit with your Primary Care doctor or seek care at a local urgent care clinic to avoid delays in care.

## 2020-12-11 NOTE — Progress Notes (Signed)
Virtual Visit via Video Note  I connected with Cathy Bell  on 12/11/20 at  4:00 PM EDT by a video enabled telemedicine application and verified that I am speaking with the correct person using two identifiers.  Location patient: home, [place] Location provider:work or home office Persons participating in the virtual visit: patient, provider  I discussed the limitations of evaluation and management by telemedicine and the availability of in person appointments. The patient expressed understanding and agreed to proceed.   HPI:  Acute telemedicine visit for flu like symptoms: -Onset: about 1 week ago -Symptoms include: started with diarrhea and subjective fever, and nausea, had had mild HA, feels like ears are stopped up, feels like energy level is a little low -symptoms resolved and improved after the first few days - she was able to do her 4 mile walk over the weekend but was lower energy than usual -she took a covid test - which was negative -she has long term chronic issues with feelings of "my torso is not right" all over, bladder prolapse, rarely feeling good, and feels like has had chronic issues with her throat and ears on and off for years which she feels like is related to allergies  -Denies:vomiting, documented fevers, melena, hematochezia, inability to eat or drink -Pertinent past medical history: see below -Pertinent medication allergies: Allergies  Allergen Reactions  . Cefprozil Hives and Other (See Comments)  . Cheese Hives  . Peanut-Containing Drug Products Hives  . Prednisolone     Caused dangerously high eye pressure around time of eye surgery  . Salmon [Fish Allergy] Hives  . Wellbutrin [Bupropion Hcl] Hives  -COVID-19 vaccine status: vaccinated  ROS: See pertinent positives and negatives per HPI.  Past Medical History:  Diagnosis Date  . Acute ST elevation myocardial infarction (STEMI) of inferior wall ([place]) 09/26/2017   Pt presented 09/26/17 with an acute inferior  STEMI. Door to balloon time 30 minutes.   . Coronary artery disease   . Distended abdomen   . Heart disease   . History of Epstein-Barr virus infection   . Hyperlipidemia   . IBS (irritable bowel syndrome)   . OSA (obstructive sleep apnea) 08/19/2018    mild obstructive sleep apnea with an AHI of 10.6/h and oxygen desaturations as low as 88%.  . Sleep apnea 2007   uses c pap on occasion    Past Surgical History:  Procedure Laterality Date  . CARDIAC CATHETERIZATION    . CORONARY ANGIOPLASTY WITH STENT PLACEMENT  04/22/2018  . CORONARY STENT INTERVENTION N/A 04/22/2018   Procedure: CORONARY STENT INTERVENTION;  Surgeon: Lorretta Harp, MD;  Location: [facility];  Service: Cardiovascular;  Laterality: N/A;  . CORONARY/GRAFT ACUTE MI REVASCULARIZATION N/A 09/26/2017   Procedure: Coronary/Graft Acute MI Revascularization;  Surgeon: Lorretta Harp, MD;  Location: [facility];  Service: Cardiovascular;  Laterality: N/A;  . LEFT HEART CATH AND CORONARY ANGIOGRAPHY N/A 09/26/2017   Procedure: LEFT HEART CATH AND CORONARY ANGIOGRAPHY;  Surgeon: Lorretta Harp, MD;  Location: [facility];  Service: Cardiovascular;  Laterality: N/A;  . nodulectomy     Salzmann Nodular Degeneration  . STENT PLACEMENT VASCULAR ([place] HX)  2019  . TONSILLECTOMY AND ADENOIDECTOMY  1967  . WISDOM TOOTH EXTRACTION  1981     Current Outpatient Medications:  .  aspirin 81 MG chewable tablet, Chew 1 tablet (81 mg total) by mouth daily. (Patient taking differently: Chew 81 mg by mouth daily at 6 (six) AM.  0630), Disp: 90 tablet, Rfl: 3 .  atorvastatin (LIPITOR) 80 MG tablet, TAKE 1 TABLET (80 MG TOTAL) BY MOUTH DAILY AT 6 PM., Disp: 90 tablet, Rfl: 3 .  Cholecalciferol (VITAMIN D3) 5000 units TABS, Take 5,000 Units by mouth 3 (three) times a week. 0630, Disp: , Rfl:  .  clopidogrel (PLAVIX) 75 MG tablet, TAKE 1 TABLET BY MOUTH EVERY DAY, Disp: 90 tablet, Rfl: 3 .  CRANBERRY PO, Take 3  capsules by mouth 3 (three) times daily as needed (for urinary tract infection symptoms.)., Disp: , Rfl:  .  D-Mannose 500 MG CAPS, Take 500 mg by mouth 3 (three) times daily as needed (for urinary tract infection symptoms.)., Disp: , Rfl:  .  ezetimibe (ZETIA) 10 MG tablet, TAKE 1 TABLET BY MOUTH EVERY DAY, Disp: 90 tablet, Rfl: 3 .  loratadine (CLARITIN) 10 MG tablet, Take 10 mg by mouth daily as needed for allergies., Disp: , Rfl:  .  nitroGLYCERIN (NITROSTAT) 0.4 MG SL tablet, Place 1 tablet (0.4 mg total) under the tongue every 5 (five) minutes x 3 doses as needed for chest pain., Disp: 25 tablet, Rfl: 3 .  Omega-3 Fatty Acids (SUPER OMEGA-3) 1000 MG CAPS, Take 1,000 mg by mouth 2 (two) times daily. 0630, Disp: , Rfl:  .  Probiotic Product (PROBIOTIC ADVANCED PO), Take 1 capsule by mouth daily at 6 (six) AM. (0630) Suprema Dophilus, Disp: , Rfl:  .  sodium chloride (MURO 128) 5 % ophthalmic ointment, Place 1 application into both eyes at bedtime., Disp: , Rfl:  .  sodium chloride (MURO 128) 5 % ophthalmic solution, Place 1 drop into both eyes 3 (three) times daily., Disp: , Rfl:   EXAM:  VITALS per patient if applicable:  GENERAL: alert, oriented, appears well and in no acute distress  HEENT: atraumatic, conjunttiva clear, no obvious abnormalities on inspection of external nose and ears  NECK: normal movements of the head and neck  LUNGS: on inspection no signs of respiratory distress, breathing rate appears normal, no obvious gross SOB, gasping or wheezing  CV: no obvious cyanosis  MS: moves all visible extremities without noticeable abnormality  PSYCH/NEURO: pleasant and cooperative, no obvious depression or anxiety, speech and thought processing grossly intact  ASSESSMENT AND PLAN:  Discussed the following assessment and plan:  Diarrhea, unspecified type  Nausea  -we discussed possible serious and likely etiologies, options for evaluation and workup, limitations of  telemedicine visit vs in person visit, treatment, treatment risks and precautions. Pt prefers to treat via telemedicine empirically rather than in person at this moment.  For the gastrointestinal issues, it seems like she may have had a viral gastroenteritis, versus other.  It seems that those symptoms have improved.  For this, advised oral hydration, Imodium if needed, no dairy or red meat for 1 week. For the chronic symptoms, she has quite a few of them, and advised that she schedule an in person visit with her primary care doctor for evaluation of these concerns.  Talked about possibly seeing ear nose and throat for the throat and ear issues.  Also discussed possible options for bladder prolapse.  She plans to discuss these further with Dr. Yong Channel.  I did send a note to Dr. Ansel Bong office to assist with scheduling an appointment. Scheduled follow up with PCP offered:Sent message to schedulers to assist and advised patient to contact PCP office to schedule if does not receive call back in next 24 hours. Advised to seek prompt in person care  if worsening, new symptoms arise, or if is not improving with treatment.   I discussed the assessment and treatment plan with the patient. The patient was provided an opportunity to ask questions and all were answered. The patient agreed with the plan and demonstrated an understanding of the instructions.     Lucretia Kern, DO

## 2021-01-01 ENCOUNTER — Encounter: Payer: Self-pay | Admitting: Family Medicine

## 2021-01-01 ENCOUNTER — Ambulatory Visit (INDEPENDENT_AMBULATORY_CARE_PROVIDER_SITE_OTHER): Payer: 59 | Admitting: Family Medicine

## 2021-01-01 ENCOUNTER — Other Ambulatory Visit: Payer: Self-pay

## 2021-01-01 VITALS — BP 98/64 | HR 74 | Temp 98.5°F | Ht 63.0 in | Wt 166.8 lb

## 2021-01-01 DIAGNOSIS — Z8632 Personal history of gestational diabetes: Secondary | ICD-10-CM | POA: Diagnosis not present

## 2021-01-01 DIAGNOSIS — E559 Vitamin D deficiency, unspecified: Secondary | ICD-10-CM

## 2021-01-01 DIAGNOSIS — E7849 Other hyperlipidemia: Secondary | ICD-10-CM | POA: Diagnosis not present

## 2021-01-01 DIAGNOSIS — R350 Frequency of micturition: Secondary | ICD-10-CM | POA: Diagnosis not present

## 2021-01-01 DIAGNOSIS — Z9861 Coronary angioplasty status: Secondary | ICD-10-CM

## 2021-01-01 DIAGNOSIS — Z23 Encounter for immunization: Secondary | ICD-10-CM

## 2021-01-01 DIAGNOSIS — I2510 Atherosclerotic heart disease of native coronary artery without angina pectoris: Secondary | ICD-10-CM

## 2021-01-01 LAB — POC URINALSYSI DIPSTICK (AUTOMATED)
Bilirubin, UA: NEGATIVE
Blood, UA: NEGATIVE
Glucose, UA: NEGATIVE
Ketones, UA: NEGATIVE
Leukocytes, UA: NEGATIVE
Nitrite, UA: NEGATIVE
Protein, UA: NEGATIVE
Spec Grav, UA: 1.025 (ref 1.010–1.025)
Urobilinogen, UA: 0.2 E.U./dL
pH, UA: 6 (ref 5.0–8.0)

## 2021-01-01 LAB — CBC WITH DIFFERENTIAL/PLATELET
Basophils Absolute: 0 10*3/uL (ref 0.0–0.1)
Basophils Relative: 0.3 % (ref 0.0–3.0)
Eosinophils Absolute: 0.2 10*3/uL (ref 0.0–0.7)
Eosinophils Relative: 2.1 % (ref 0.0–5.0)
HCT: 40.8 % (ref 36.0–46.0)
Hemoglobin: 13.8 g/dL (ref 12.0–15.0)
Lymphocytes Relative: 21.7 % (ref 12.0–46.0)
Lymphs Abs: 1.9 10*3/uL (ref 0.7–4.0)
MCHC: 33.8 g/dL (ref 30.0–36.0)
MCV: 86.9 fl (ref 78.0–100.0)
Monocytes Absolute: 0.7 10*3/uL (ref 0.1–1.0)
Monocytes Relative: 7.5 % (ref 3.0–12.0)
Neutro Abs: 5.9 10*3/uL (ref 1.4–7.7)
Neutrophils Relative %: 68.4 % (ref 43.0–77.0)
Platelets: 195 10*3/uL (ref 150.0–400.0)
RBC: 4.69 Mil/uL (ref 3.87–5.11)
RDW: 13.4 % (ref 11.5–15.5)
WBC: 8.6 10*3/uL (ref 4.0–10.5)

## 2021-01-01 LAB — COMPREHENSIVE METABOLIC PANEL
ALT: 17 U/L (ref 0–35)
AST: 18 U/L (ref 0–37)
Albumin: 4.9 g/dL (ref 3.5–5.2)
Alkaline Phosphatase: 67 U/L (ref 39–117)
BUN: 15 mg/dL (ref 6–23)
CO2: 31 mEq/L (ref 19–32)
Calcium: 10 mg/dL (ref 8.4–10.5)
Chloride: 103 mEq/L (ref 96–112)
Creatinine, Ser: 0.69 mg/dL (ref 0.40–1.20)
GFR: 93.92 mL/min (ref >60.00)
Glucose, Bld: 96 mg/dL (ref 70–99)
Potassium: 4.1 mEq/L (ref 3.5–5.1)
Sodium: 141 mEq/L (ref 135–145)
Total Bilirubin: 0.8 mg/dL (ref 0.2–1.2)
Total Protein: 7.7 g/dL (ref 6.0–8.3)

## 2021-01-01 LAB — VITAMIN D 25 HYDROXY (VIT D DEFICIENCY, FRACTURES): VITD: 40.31 ng/mL (ref 30.00–100.00)

## 2021-01-01 LAB — TSH: TSH: 1.38 u[IU]/mL (ref 0.35–4.50)

## 2021-01-01 LAB — HEMOGLOBIN A1C: Hgb A1c MFr Bld: 6.3 % (ref 4.6–6.5)

## 2021-01-01 MED ORDER — AZELASTINE HCL 0.1 % NA SOLN: 1.0000 | Freq: Two times a day (BID) | NASAL | 12 refills | Status: DC

## 2021-01-01 NOTE — Patient Instructions (Addendum)
Shingrix #1 today. Repeat injection in 2-5 months. Schedule a nurse visit for the 2nd injection before you leave today (at the check out desk). Another option would be to schedule a physical around 4-5 months so we can check back in and do shingrix #2  Sherlene Shams, MD Urogynecology- potential treatment for prolapse  If you do not hear from me within 2 weeks about possibly stopping aspirin, please send me a friendly reminder for update on status  Consider holistic or functional medicine- [facility] in [place] is an option - another option in [place] - [email or web address]  Trial astelin nose spray. If nosebleeds stop. If no improvement in symptoms- lets get ENT opinion  Please stop by lab before you go If you have mychart- we will send your results within 3 business days of [place] receiving them.  If you do not have mychart- we will call you about results within 5 business days of [place] receiving them.  *please also note that you will see labs on mychart as soon as they post. I will later go in and write notes on them- will say "notes from Dr. Yong Channel"

## 2021-01-01 NOTE — Addendum Note (Signed)
Addended by: Marin Olp on: 01/01/2021 01:09 PM   Modules accepted: Level of Service

## 2021-01-01 NOTE — Progress Notes (Signed)
Phone [phone] In person visit   Subjective:   Cathy Bell is a 60 y.o. year old very pleasant female patient who presents for/with See problem oriented charting Chief Complaint  Patient presents with   Digestive Problem     This visit occurred during the SARS-CoV-2 public health emergency.  Safety protocols were in place, including screening questions prior to the visit, additional usage of staff PPE, and extensive cleaning of exam room while observing appropriate contact time as indicated for disinfecting solutions.   Past Medical History-  Patient Active Problem List   Diagnosis Date Noted   CAD S/P percutaneous coronary angioplasty 10/20/2017    Priority: High   Hot flashes due to menopause 12/08/2017    Priority: Medium   History of gestational diabetes 11/03/2017    Priority: Medium   Overweight (BMI 25.0-29.9) 11/03/2017    Priority: Medium   Familial hyperlipidemia     Priority: Medium   Brain lesion 11/03/2017    Priority: Low   Multiple thyroid nodules 11/03/2017    Priority: Low   Rosacea 11/03/2017    Priority: Low   Exertional chest pain 10/20/2017    Priority: Low   FATIGUE, CHRONIC 03/10/2007    Priority: Low   OSA (obstructive sleep apnea) 08/19/2018   CAD (coronary artery disease) 04/22/2018   Unstable angina (HCC)    Acute ST elevation myocardial infarction (STEMI) of inferior wall ([place]) 09/26/2017    Medications- reviewed and updated Current Outpatient Medications  Medication Sig Dispense Refill   aspirin 81 MG chewable tablet Chew 1 tablet (81 mg total) by mouth daily. (Patient taking differently: Chew 81 mg by mouth daily at 6 (six) AM. 0630) 90 tablet 3   atorvastatin (LIPITOR) 80 MG tablet TAKE 1 TABLET (80 MG TOTAL) BY MOUTH DAILY AT 6 PM. 90 tablet 3   azelastine (ASTELIN) 0.1 % nasal spray Place 1 spray into both nostrils 2 (two) times daily. Use in each nostril as directed 30 mL 12   Cholecalciferol (VITAMIN D3) 5000 units TABS  Take 5,000 Units by mouth 3 (three) times a week. 0630     clopidogrel (PLAVIX) 75 MG tablet TAKE 1 TABLET BY MOUTH EVERY DAY 90 tablet 3   CRANBERRY PO Take 3 capsules by mouth 3 (three) times daily as needed (for urinary tract infection symptoms.).     D-Mannose 500 MG CAPS Take 500 mg by mouth 3 (three) times daily as needed (for urinary tract infection symptoms.).     ezetimibe (ZETIA) 10 MG tablet TAKE 1 TABLET BY MOUTH EVERY DAY 90 tablet 3   loratadine (CLARITIN) 10 MG tablet Take 10 mg by mouth daily as needed for allergies.     nitroGLYCERIN (NITROSTAT) 0.4 MG SL tablet Place 1 tablet (0.4 mg total) under the tongue every 5 (five) minutes x 3 doses as needed for chest pain. 25 tablet 3   Omega-3 Fatty Acids (SUPER OMEGA-3) 1000 MG CAPS Take 1,000 mg by mouth 2 (two) times daily. 0630     Probiotic Product (PROBIOTIC ADVANCED PO) Take 1 capsule by mouth daily at 6 (six) AM. (0630) Suprema Dophilus     sodium chloride (MURO 128) 5 % ophthalmic ointment Place 1 application into both eyes at bedtime.     sodium chloride (MURO 128) 5 % ophthalmic solution Place 1 drop into both eyes 3 (three) times daily.     No current facility-administered medications for this visit.     Objective:  BP 98/64  Pulse 74   Temp 98.5 F (36.9 C) (Temporal)   Ht 5\' 3"  (1.6 m)   Wt 166 lb 12.8 oz (75.7 kg)   LMP 03/18/2011   SpO2 99%   BMI 29.55 kg/m  Gen: NAD, resting comfortably HEENT: nasal turbinates slightly edematous with clear and yellow discharge combined, Oropharynx large and normal  CV: RRR no murmurs rubs or gallops Lungs: CTAB no crackles, wheeze, rhonchi Abdomen: soft/nontender/nondistended/normal bowel sounds. No rebound or guarding.  Ext: no edema Skin: warm, dry     Assessment and Plan  #CAD #hyperlipidemia S: Medication: Atorvastatin 80 mg, Zetia 10 mg, Plavix 75 mg, Aspirin 81 mg, Nitroglycerin as needed -No chest pain or shortness of breath reported.  Recently had visit  with Dr. Gwenlyn Found Lab Results  Component Value Date   CHOL 127 11/21/2020   HDL 49 11/21/2020   LDLCALC 63 11/21/2020   LDLDIRECT 91.0 11/03/2017   TRIG 75 11/21/2020   CHOLHDL 2.6 11/21/2020   A/P: CAD remains asymptomatic.  With bleeding gums issue reached out to Dr. Gwenlyn Found and he is okay with stopping Plavix.  Patient will continue atorvastatin 80 mg, Zetia 10 mg, aspirin 81 mg  For hyperlipidemia excellent control with LDL under 70-continue current medications  #Vitamin D deficiency S: Medication: Vitamin D3 5000 units Last vitamin D Lab Results  Component Value Date   VD25OH 55.55 05/06/2018  A/P: Has not recently been checked-hopefully well-controlled on repeat today-May continue vitamin D 5000 units for now   #Multiple concerns  S: Patient in general states she just feels well/good overall for any extended period of time.  She feels like she cycles between feeling tired/not well rested despite sleeping and using CPAP vs. sinus issues or flulike sensation in her head vs. stomach not feeling right vs. feeling tired/achy  Fatigue- she wakes up sometimes feeling as if she need sleeps still- she does sleep with a CPAP to monitor how much sleep she is getting and states is getting adequate amount of sleep each night.  Sometimes described as sleepiness sometimes simply rundown/fatigue.  Can get some body aches with this.  Abdominal swelling/Colon- bloating after eating. Mid-back/side  near waist sore at times. Yeasty smelling urine sometimes- she has a "yeast infection sensation" throughout her abdomen which she describes as a puffy feeling. Frequent urination and bowel movements. Denies vaginal discharge, hematuria.  Feels she could be on the verge of a UTI -Patient did have diarrhea about 20 days ago potential viral gastroenteritis -See prolapse issue below-patient also feels like this contributes to general bloating/fullness sensation -She did see GI within the last year but was told  colonoscopy was not yet due-they did not recommend for her current symptoms  Digestive- frequent feeling of on the verge of E.coli. 16 weeks of 1200-1400 calories and no visible weight loss. She craves sweets and carbs more often now a days. Eats tofu- almost daily, vegetables, no red meats-until recently at a wedding; avoids eating certain foods like nuts, certain spices, cheeses- mostly parmesan, potatoes.  She cut out milk over 20 years   Intermittent gum bleeding- near constant especially when low in energy or she is not feeling well. Results in malodorous breath and feeling as if her mouth is clean. She has had 2 dental visits- no signs of gum or dental disease- dentist did suggest that the blood is not clotting due to her medications.  Patient also wonders if this could be a potential allergy related issue.   She does having  a tingling sensation in her mouth sometime in the past but has not occurred recently  Throat and Ears- 3-4 incidents in the last year of choking on water or saliva.  Has never had issues with solid foods such as proteins she feels like this generally starts out with congested sinuses and drainage-does have some baseline allergies and takes Claritin and this has been somewhat helpful-with reaction to prednisone in regards to eye pressures has not been able to take Flonase-has not tried Astelin sometimes feels that her ears were stopped up once when flying. Chokes and difficulty swallowing properly. Sinus headaches or allergies with clogged ears and post nasal drip.Sometimes eye are dry but not as bad as during Salzman flare ups. Difficulty breathing through nasal passages so she is forced to breath through her mouth. She is using Claritin daily,  and avoiding Flonase due to prior steroid reaction. difficulty exercising and walking. Her gynecologist retired and now sees a PA at [employer]- they reported that this is normal for her age but she is very early in the process.  Symptoms worsen with her Gut is acting up.  Body Temperature Regulation-not usually cold or feeling chills but has had episodes of this recently. She does not usually get hot during the middle of the day sometimes- fainting sensation. Denies any night sweats, fevers, or stress.  Dry patches on Skin- intermittent patches and brown arm arm hair. She has never had this before.  A/P: For fatigue issues we will update labs today.  Has recently had cardiology visit-does not sound cardiac in nature.  Also with some temperature regulation issues and dry patches on skin-check TSH.  Also check vitamin D with history of this being low  For abdominal swelling or low back swelling sensation/puffiness has already seen GI 12/13/2019.  Colonoscopy was not recommended at that time-there was abdominal imaging.  Patient is concerned she could be having potential food related issues/possible allergy or intolerance.  We discussed possible functional medicine or holistic medicine visit-apparently she saw functional medicine 10 to 12 years ago and has made some adjustments which were somewhat helpful  For gum bleeding-I think this could be related to taking both aspirin and Plavix-I reached out to Dr. Alvester Chou and he is okay with her stopping Plavix-we will see if that is beneficial  For throat/ears/allergies/congestion issues-she will continue Claritin and we will -trial Astelin 1 spray per nostril 2 times per day-I asked her to run this by her eye doctor to be extra cautious given prior eye pressure issues -If not improving consider ENT consult  Patient also feels like her symptoms are not always thoroughly evaluated-instead major issues are ruled out but she continues to feel poorly overall-feels like this makes her avoid healthcare interactions-apologized to patient if we have made her feel this way in the past-I certainly want her to tell us what she is experiencing so we can attempt to help.  I encouraged her to schedule  a follow-up in a few months so we can reevaluate  # Prolapse/ Cystocele-   S: Patient feels like issues with her cystocele/prolapse make it difficult to exercise and walk.  Feels like there is a relation between prolapse and when her GI tract is acting up.  She has had a visit with the PA at her gynecology office but was told she needed a visit with the physician A/P: Patient currently exploring options-I gave her the name of [facility] urogynecologist to consider-certainly could remain with [facility] as well  but patient would like someone on the system if possible   Recommended follow up: No follow-ups on file. Future Appointments  Date Time Provider [place]  01/29/2021  8:00 AM [facility] 2 [facility]    Lab/Order associations:   ICD-10-CM   1. CAD S/P percutaneous coronary angioplasty  I25.10 CBC with Differential/Platelet   Z98.61 Comprehensive metabolic panel    2. Familial hyperlipidemia  E78.49 CBC with Differential/Platelet    Comprehensive metabolic panel    TSH    3. History of gestational diabetes  Z86.32 HgB A1c    4. Urinary frequency  R35.0 POCT Urinalysis Dipstick (Automated)    Urine Culture    5. Vitamin D deficiency  E55.9 VITAMIN D 25 Hydroxy (Vit-D Deficiency, Fractures)    6. Immunization due  Z23 Varicella-zoster vaccine IM (Shingrix)      Meds ordered this encounter  Medications   azelastine (ASTELIN) 0.1 % nasal spray    Sig: Place 1 spray into both nostrils 2 (two) times daily. Use in each nostril as directed    Dispense:  30 mL    Refill:  12    Time Spent: 51 minutes of total time (10:46 AM-11:37 AM) was spent on the date of the encounter performing the following actions: chart review prior to seeing the patient, obtaining history, performing a medically necessary exam, counseling on the treatment plan, placing orders, and documenting in our EHR.   I,Harris Phan,acting as a Education administrator for Garret Reddish, MD.,have documented  all relevant documentation on the behalf of Garret Reddish, MD,as directed by  Garret Reddish, MD while in the presence of Garret Reddish, MD.   I, Garret Reddish, MD, have reviewed all documentation for this visit. The documentation on 01/01/21 for the exam, diagnosis, procedures, and orders are all accurate and complete.   Return precautions advised.  Garret Reddish, MD

## 2021-01-02 LAB — URINE CULTURE
MICRO NUMBER:: 12031934
Result:: NO GROWTH
SPECIMEN QUALITY:: ADEQUATE

## 2021-01-29 ENCOUNTER — Ambulatory Visit: Payer: 59

## 2021-02-08 ENCOUNTER — Ambulatory Visit: Payer: 59 | Admitting: Physician Assistant

## 2021-02-13 ENCOUNTER — Ambulatory Visit
Admission: RE | Admit: 2021-02-13 | Discharge: 2021-02-13 | Disposition: A | Discharge status: Home / Self Care | Payer: 59 | Source: Ambulatory Visit | Attending: Obstetrics & Gynecology | Admitting: Obstetrics & Gynecology

## 2021-02-13 ENCOUNTER — Other Ambulatory Visit: Payer: Self-pay | Admitting: Obstetrics & Gynecology

## 2021-02-13 ENCOUNTER — Other Ambulatory Visit: Payer: Self-pay

## 2021-02-13 DIAGNOSIS — Z1231 Encounter for screening mammogram for malignant neoplasm of breast: Secondary | ICD-10-CM

## 2021-03-21 ENCOUNTER — Other Ambulatory Visit: Payer: Self-pay

## 2021-03-21 ENCOUNTER — Ambulatory Visit (INDEPENDENT_AMBULATORY_CARE_PROVIDER_SITE_OTHER): Payer: 59 | Admitting: *Deleted

## 2021-03-21 DIAGNOSIS — Z23 Encounter for immunization: Secondary | ICD-10-CM

## 2021-03-21 NOTE — Progress Notes (Signed)
Pt here for her second Shingrix vaccine,allregies reviewed. Vaccines given, tolerated well.

## 2021-04-01 ENCOUNTER — Encounter: Payer: Self-pay | Admitting: Family Medicine

## 2021-06-17 ENCOUNTER — Other Ambulatory Visit: Payer: Self-pay | Admitting: Cardiovascular Disease

## 2021-07-21 ENCOUNTER — Encounter: Payer: Self-pay | Admitting: Gastroenterology

## 2021-10-04 ENCOUNTER — Other Ambulatory Visit: Payer: Self-pay

## 2021-10-04 ENCOUNTER — Ambulatory Visit (AMBULATORY_SURGERY_CENTER): Payer: 59

## 2021-10-04 VITALS — Ht 63.0 in | Wt 150.0 lb

## 2021-10-04 DIAGNOSIS — Z800 Family history of malignant neoplasm of digestive organs: Secondary | ICD-10-CM

## 2021-10-04 MED ORDER — NA SULFATE-K SULFATE-MG SULF 17.5-3.13-1.6 GM/177ML PO SOLN: 1.0000 | Freq: Once | ORAL | 0 refills | Status: AC

## 2021-10-04 NOTE — Progress Notes (Signed)
No egg or soy allergy known to patient  ?No issues known to pt with past sedation with any surgeries or procedures ?Patient denies ever being told they had issues or difficulty with intubation  ?No FH of Malignant Hyperthermia ?Pt is not on diet pills ?Pt is not on home 02  ?Pt is not on blood thinners  ?Pt denies issues with constipation;  ?No A fib or A flutter ?Pt is fully vaccinated for Covid x 2; ?NO PA's for preps discussed with pt in PV today  ?Discussed with pt there will be an out-of-pocket cost for prep and that varies from $0 to 70 + dollars - pt verbalized understanding  ?Due to the COVID-19 pandemic we are asking patients to follow certain guidelines in PV and the [facility]   ?Pt aware of COVID protocols and [facility] guidelines  ?PV completed over the phone. Pt verified name, DOB, address and insurance during PV today.  ?Pt mailed instruction packet with copy of consent form to read and not return, and instructions.  ?Pt encouraged to call with questions or issues.  ?If pt has My chart, procedure instructions sent via My Chart  ? ?

## 2021-10-10 ENCOUNTER — Encounter: Payer: Self-pay | Admitting: Gastroenterology

## 2021-10-11 ENCOUNTER — Encounter: Payer: Self-pay | Admitting: Gastroenterology

## 2021-10-11 ENCOUNTER — Ambulatory Visit (AMBULATORY_SURGERY_CENTER): Payer: 59 | Admitting: Gastroenterology

## 2021-10-11 VITALS — BP 124/68 | HR 64 | Temp 97.5°F | Resp 15 | Ht 63.0 in | Wt 150.0 lb

## 2021-10-11 DIAGNOSIS — D123 Benign neoplasm of transverse colon: Secondary | ICD-10-CM

## 2021-10-11 DIAGNOSIS — G4733 Obstructive sleep apnea (adult) (pediatric): Secondary | ICD-10-CM | POA: Diagnosis not present

## 2021-10-11 DIAGNOSIS — I2510 Atherosclerotic heart disease of native coronary artery without angina pectoris: Secondary | ICD-10-CM | POA: Diagnosis not present

## 2021-10-11 DIAGNOSIS — Z800 Family history of malignant neoplasm of digestive organs: Secondary | ICD-10-CM

## 2021-10-11 DIAGNOSIS — Z1211 Encounter for screening for malignant neoplasm of colon: Secondary | ICD-10-CM | POA: Diagnosis not present

## 2021-10-11 DIAGNOSIS — K635 Polyp of colon: Secondary | ICD-10-CM

## 2021-10-11 MED ORDER — SODIUM CHLORIDE 0.9 % IV SOLN: 500.0000 mL | Freq: Once | INTRAVENOUS | Status: DC

## 2021-10-11 NOTE — Progress Notes (Signed)
HPI: ?This is a woman with FH of CRC ? ? ?Family history colon cancer, her father had colon cancer diagnosed at age 62.  Colonoscopy January 2013 was completely normal. ? ? ?ROS: complete GI ROS as described in HPI, all other review negative. ? ?Constitutional:  No unintentional weight loss ? ? ?Past Medical History:  ?Diagnosis Date  ? Acute ST elevation myocardial infarction (STEMI) of inferior wall ([place]) 09/26/2017  ? Pt presented 09/26/17 with an acute inferior STEMI. Door to balloon time 30 minutes.   ? Coronary artery disease   ? Distended abdomen   ? Heart disease   ? History of Epstein-Barr virus infection   ? Hyperlipidemia   ? on meds  ? IBS (irritable bowel syndrome)   ? OSA (obstructive sleep apnea) 08/19/2018  ?  mild obstructive sleep apnea with an AHI of 10.6/h and oxygen desaturations as low as 88%.  ? Salzmann nodular degeneration   ? being monitored by eye MD  ? Seasonal allergies   ? Sleep apnea 2007  ? uses CPAP  ? ? ?Past Surgical History:  ?Procedure Laterality Date  ? CARDIAC CATHETERIZATION    ? CORONARY ANGIOPLASTY WITH STENT PLACEMENT  04/22/2018  ? CORONARY STENT INTERVENTION N/A 04/22/2018  ? Procedure: CORONARY STENT INTERVENTION;  Surgeon: Lorretta Harp, MD;  Location: [facility];  Service: Cardiovascular;  Laterality: N/A;  ? CORONARY/GRAFT ACUTE MI REVASCULARIZATION N/A 09/26/2017  ? Procedure: Coronary/Graft Acute MI Revascularization;  Surgeon: Lorretta Harp, MD;  Location: [facility];  Service: Cardiovascular;  Laterality: N/A;  ? LEFT HEART CATH AND CORONARY ANGIOGRAPHY N/A 09/26/2017  ? Procedure: LEFT HEART CATH AND CORONARY ANGIOGRAPHY;  Surgeon: Lorretta Harp, MD;  Location: [facility];  Service: Cardiovascular;  Laterality: N/A;  ? nodulectomy  2018  ? Salzmann Nodular Degeneration x 2 procedures (bilateral eyes sx)  ? STENT PLACEMENT VASCULAR ([place] HX)  2019  ? [place]  ? [place] EXTRACTION  1981   ? ? ?Current Outpatient Medications  ?Medication Sig Dispense Refill  ? aspirin 81 MG chewable tablet Chew 1 tablet (81 mg total) by mouth daily. (Patient taking differently: Chew 81 mg by mouth daily at 6 (six) AM. 0630) 90 tablet 3  ? atorvastatin (LIPITOR) 80 MG tablet TAKE 1 TABLET BY MOUTH DAILY AT 6 PM. 90 tablet 3  ? cetirizine (ZYRTEC) 10 MG tablet Take 10 mg by mouth daily.    ? Cholecalciferol (VITAMIN D3) 5000 units TABS Take 5,000 Units by mouth 3 (three) times a week. 0630    ? ezetimibe (ZETIA) 10 MG tablet TAKE 1 TABLET BY MOUTH EVERY DAY 90 tablet 3  ? Omega-3 Fatty Acids (SUPER OMEGA-3) 1000 MG CAPS Take 1,000 mg by mouth 2 (two) times daily. 0630    ? Probiotic Product (PROBIOTIC ADVANCED PO) Take 1 capsule by mouth daily at 6 (six) AM. (0630) Suprema Dophilus    ? sodium chloride (MURO 128) 5 % ophthalmic ointment Place 1 application into both eyes at bedtime.    ? CRANBERRY PO Take 3 capsules by mouth 3 (three) times daily as needed (for urinary tract infection symptoms.).    ? D-Mannose 500 MG CAPS Take 500 mg by mouth 3 (three) times daily as needed (for urinary tract infection symptoms.).    ? nitroGLYCERIN (NITROSTAT) 0.4 MG SL tablet Place 1 tablet (0.4 mg total) under the tongue every 5 (five) minutes x 3 doses as needed  for chest pain. (Patient not taking: Reported on 10/04/2021) 25 tablet 3  ? ?Current Facility-Administered Medications  ?Medication Dose Route Frequency Provider Last Rate Last Admin  ? 0.9 %  sodium chloride infusion  500 mL Intravenous Once Milus Banister, MD      ? ? ?Allergies as of 10/11/2021 - Review Complete 10/11/2021  ?Allergen Reaction Noted  ? Cefprozil Hives and Other (See Comments) 01/17/2015  ? Cheese Hives 09/26/2017  ? Peanut-containing drug products Hives 09/26/2017  ? Prednisolone  11/03/2017  ? Dani Gobble [fish allergy] Hives 09/26/2017  ? Wellbutrin [bupropion hcl] Hives 07/21/2011  ? ? ?Family History  ?Problem Relation Age of Onset  ? Hyperlipidemia  Mother   ? Other Mother   ?     pacemaker. she is very short stature. healthy until 9s  ? Colon cancer Father   ?     healthy until 34 then developed  ? Colon polyps Father 34  ? Atrial fibrillation Father   ?     pacemaker  ? Breast cancer Sister   ?     stage 0   ? Hashimoto's thyroiditis Sister   ? Breast cancer Paternal Aunt   ? Breast cancer Paternal Aunt   ? Diabetes Maternal Grandfather   ? Stroke Son   ?     due to PFO  ? ? ?Social History  ? ?Socioeconomic History  ? Marital status: Divorced  ?  Spouse name: Not on file  ? Number of children: 2  ? Years of education: Not on file  ? Highest education level: Master's degree (e.g., MA, MS, MEng, MEd, MSW, MBA)  ?Occupational History  ? Occupation: Optometrist  ?Tobacco Use  ? Smoking status: Never  ? Smokeless tobacco: Never  ?Vaping Use  ? Vaping Use: Never used  ?Substance and Sexual Activity  ? Alcohol use: No  ? Drug use: No  ? Sexual activity: Not Currently  ?Other Topics Concern  ? Not on file  ?Social History Narrative  ? Divorced. 2 sons [name] 26 and [name]. No grandkids yet.   ?   ? 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 Undergrad at [employer]. Graduate school at [employer]  ?   ? Hobbies: walking, surface design- textile art, play mahjong, movies, reading, travel  ? ?Social Determinants of Health  ? ?Financial Resource Strain: Not on file  ?Food Insecurity: Not on file  ?Transportation Needs: Not on file  ?Physical Activity: Not on file  ?Stress: Not on file  ?Social Connections: Not on file  ?Intimate Partner Violence: Not on file  ? ? ? ?Physical Exam: ?BP (!) 98/59   Pulse 66   Temp (!) 97.5 ?F (36.4 ?C)   Ht '5\' 3"'$  (1.6 m)   Wt 150 lb (68 kg)   LMP 03/18/2011   SpO2 98%   BMI 26.57 kg/m?  ?Constitutional: generally well-appearing ?Psychiatric: alert and oriented x3 ?Lungs: CTA bilaterally ?Heart: no MCR ? ?Assessment and plan: ?61 y.o. female  with an insignificant FH of CRC ? ?Routine risk screening colonoscopy today ? ?Care is appropriate for the ambulatory setting. ? ?Owens Loffler, MD ?[facility] ?10/11/2021, 10:24 AM ? ? ote ? ?

## 2021-10-11 NOTE — Progress Notes (Signed)
Pt's states no medical or surgical changes since previsit or office visit. 

## 2021-10-11 NOTE — Progress Notes (Signed)
Called to room to assist during endoscopic procedure.  Patient ID and intended procedure confirmed with present staff. Received instructions for my participation in the procedure from the performing physician.  

## 2021-10-11 NOTE — Progress Notes (Signed)
1038 Ephedrine 10 mg given IV due to low BP, MD updated.  

## 2021-10-11 NOTE — Op Note (Signed)
[place] ?Patient Name: Cathy Bell ?Procedure Date: 10/11/2021 10:29 AM ?MRN: [id number] ?Endoscopist: Milus Banister , MD ?Age: 61 ?Referring MD:  ?Date of Birth: 08/10/1960 ?Gender: Female ?Account #: [id number] ?Procedure:                Colonoscopy ?Indications:              Screening for colorectal malignant neoplasm; her  ?                          father had colon cancer diagnosed at age 22.  ?                          Colonoscopy January 2013 was completely normal. ?Medicines:                Monitored Anesthesia Care ?Procedure:                Pre-Anesthesia Assessment: ?                          - Prior to the procedure, a History and Physical  ?                          was performed, and patient medications and  ?                          allergies were reviewed. The patient's tolerance of  ?                          previous anesthesia was also reviewed. The risks  ?                          and benefits of the procedure and the sedation  ?                          options and risks were discussed with the patient.  ?                          All questions were answered, and informed consent  ?                          was obtained. Prior Anticoagulants: The patient has  ?                          taken no previous anticoagulant or antiplatelet  ?                          agents. ASA Grade Assessment: II - A patient with  ?                          mild systemic disease. After reviewing the risks  ?                          and benefits, the patient was deemed in  ?  satisfactory condition to undergo the procedure. ?                          After obtaining informed consent, the colonoscope  ?                          was passed under direct vision. Throughout the  ?                          procedure, the patient's blood pressure, pulse, and  ?                          oxygen saturations were monitored continuously. The  ?                          CF [id number]  #[id number] was introduced through the anus  ?                          and advanced to the the cecum, identified by  ?                          appendiceal orifice and ileocecal valve. The  ?                          colonoscopy was performed without difficulty. The  ?                          patient tolerated the procedure well. The quality  ?                          of the bowel preparation was good. The ileocecal  ?                          valve, appendiceal orifice, and rectum were  ?                          photographed. ?Scope In: 10:31:41 AM ?Scope Out: 10:44:45 AM ?Scope Withdrawal Time: 0 hours 8 minutes 57 seconds  ?Total Procedure Duration: 0 hours 13 minutes 4 seconds  ?Findings:                 A 3 mm polyp was found in the transverse colon. The  ?                          polyp was sessile. The polyp was removed with a  ?                          cold snare. Resection and retrieval were complete. ?                          A few small-mouthed diverticula were found in the  ?                          left colon. ?  The exam was otherwise without abnormality on  ?                          direct and retroflexion views. ?Complications:            No immediate complications. Estimated blood loss:  ?                          None. ?Estimated Blood Loss:     Estimated blood loss: none. ?Impression:               - One 3 mm polyp in the transverse colon, removed  ?                          with a cold snare. Resected and retrieved. ?                          - Diverticulosis in the left colon. ?                          - The examination was otherwise normal on direct  ?                          and retroflexion views. ?Recommendation:           - Patient has a contact number available for  ?                          emergencies. The signs and symptoms of potential  ?                          delayed complications were discussed with the  ?                          patient. Return to  normal activities tomorrow.  ?                          Written discharge instructions were provided to the  ?                          patient. ?                          - Resume previous diet. ?                          - Continue present medications. ?                          - Await pathology results. ?Milus Banister, MD ?10/11/2021 10:47:19 AM ?This report has been signed electronically. ?

## 2021-10-11 NOTE — Progress Notes (Signed)
Report given to PACU, vss 

## 2021-10-11 NOTE — Patient Instructions (Signed)
Thank you for letting us take care of your healthcare needs today. Please see handouts given to you on Polyps and Diverticulosis.   YOU HAD AN ENDOSCOPIC PROCEDURE TODAY AT THE  ENDOSCOPY CENTER:   Refer to the procedure report that was given to you for any specific questions about what was found during the examination.  If the procedure report does not answer your questions, please call your gastroenterologist to clarify.  If you requested that your care partner not be given the details of your procedure findings, then the procedure report has been included in a sealed envelope for you to review at your convenience later.  YOU SHOULD EXPECT: Some feelings of bloating in the abdomen. Passage of more gas than usual.  Walking can help get rid of the air that was put into your GI tract during the procedure and reduce the bloating. If you had a lower endoscopy (such as a colonoscopy or flexible sigmoidoscopy) you may notice spotting of blood in your stool or on the toilet paper. If you underwent a bowel prep for your procedure, you may not have a normal bowel movement for a few days.  Please Note:  You might notice some irritation and congestion in your nose or some drainage.  This is from the oxygen used during your procedure.  There is no need for concern and it should clear up in a day or so.  SYMPTOMS TO REPORT IMMEDIATELY:  Following lower endoscopy (colonoscopy or flexible sigmoidoscopy):  Excessive amounts of blood in the stool  Significant tenderness or worsening of abdominal pains  Swelling of the abdomen that is new, acute  Fever of 100F or higher   For urgent or emergent issues, a gastroenterologist can be reached at any hour by calling [phone]. Do not use MyChart messaging for urgent concerns.    DIET:  We do recommend a small meal at first, but then you may proceed to your regular diet.  Drink plenty of fluids but you should avoid alcoholic beverages for 24  hours.  ACTIVITY:  You should plan to take it easy for the rest of today and you should NOT DRIVE or use heavy machinery until tomorrow (because of the sedation medicines used during the test).    FOLLOW UP: Our staff will call the number listed on your records 48-72 hours following your procedure to check on you and address any questions or concerns that you may have regarding the information given to you following your procedure. If we do not reach you, we will leave a message.  We will attempt to reach you two times.  During this call, we will ask if you have developed any symptoms of COVID 19. If you develop any symptoms (ie: fever, flu-like symptoms, shortness of breath, cough etc.) before then, please call [phone].  If you test positive for Covid 19 in the 2 weeks post procedure, please call and report this information to us.    If any biopsies were taken you will be contacted by phone or by letter within the next 1-3 weeks.  Please call us at [phone] if you have not heard about the biopsies in 3 weeks.    SIGNATURES/CONFIDENTIALITY: You and/or your care partner have signed paperwork which will be entered into your electronic medical record.  These signatures attest to the fact that that the information above on your After Visit Summary has been reviewed and is understood.  Full responsibility of the confidentiality of this discharge information lies   with you and/or your care-partner.  

## 2021-10-15 ENCOUNTER — Telehealth: Payer: Self-pay

## 2021-10-15 NOTE — Telephone Encounter (Signed)
First post procedure follow up call, no answer 

## 2021-10-15 NOTE — Telephone Encounter (Signed)
No answer, left message to call if having any issues or concerns, B.Perez Dirico RN 

## 2021-10-20 ENCOUNTER — Encounter: Payer: Self-pay | Admitting: Gastroenterology

## 2021-12-06 ENCOUNTER — Other Ambulatory Visit: Payer: Self-pay | Admitting: Cardiovascular Disease

## 2022-01-17 ENCOUNTER — Encounter: Payer: Self-pay | Admitting: Cardiovascular Disease

## 2022-01-17 DIAGNOSIS — E7849 Other hyperlipidemia: Secondary | ICD-10-CM

## 2022-01-21 NOTE — Telephone Encounter (Signed)
Lorretta Harp, MD  You 6 minutes ago (3:07 PM)    Can absolutely do the labs she's requesting as well.   JJB

## 2022-02-03 ENCOUNTER — Other Ambulatory Visit: Payer: Self-pay | Admitting: Obstetrics & Gynecology

## 2022-02-03 DIAGNOSIS — Z1231 Encounter for screening mammogram for malignant neoplasm of breast: Secondary | ICD-10-CM

## 2022-02-14 ENCOUNTER — Ambulatory Visit
Admission: RE | Admit: 2022-02-14 | Discharge: 2022-02-14 | Disposition: A | Discharge status: Home / Self Care | Payer: 59 | Source: Ambulatory Visit | Attending: Obstetrics & Gynecology | Admitting: Obstetrics & Gynecology

## 2022-02-14 DIAGNOSIS — Z1231 Encounter for screening mammogram for malignant neoplasm of breast: Secondary | ICD-10-CM

## 2022-03-11 DIAGNOSIS — Z6827 Body mass index (BMI) 27.0-27.9, adult: Secondary | ICD-10-CM | POA: Diagnosis not present

## 2022-03-11 DIAGNOSIS — R69 Illness, unspecified: Secondary | ICD-10-CM | POA: Diagnosis not present

## 2022-03-11 DIAGNOSIS — Z01419 Encounter for gynecological examination (general) (routine) without abnormal findings: Secondary | ICD-10-CM | POA: Diagnosis not present

## 2022-03-11 DIAGNOSIS — N819 Female genital prolapse, unspecified: Secondary | ICD-10-CM | POA: Diagnosis not present

## 2022-03-12 ENCOUNTER — Other Ambulatory Visit: Payer: Self-pay | Admitting: Obstetrics and Gynecology

## 2022-03-12 DIAGNOSIS — E049 Nontoxic goiter, unspecified: Secondary | ICD-10-CM

## 2022-03-13 ENCOUNTER — Ambulatory Visit
Admission: RE | Admit: 2022-03-13 | Discharge: 2022-03-13 | Disposition: A | Discharge status: Home / Self Care | Payer: 59 | Source: Ambulatory Visit | Attending: Obstetrics and Gynecology | Admitting: Obstetrics and Gynecology

## 2022-03-13 DIAGNOSIS — E049 Nontoxic goiter, unspecified: Secondary | ICD-10-CM

## 2022-03-13 DIAGNOSIS — E041 Nontoxic single thyroid nodule: Secondary | ICD-10-CM | POA: Diagnosis not present

## 2022-03-31 DIAGNOSIS — N819 Female genital prolapse, unspecified: Secondary | ICD-10-CM | POA: Diagnosis not present

## 2022-03-31 DIAGNOSIS — N8110 Cystocele, unspecified: Secondary | ICD-10-CM | POA: Diagnosis not present

## 2022-03-31 DIAGNOSIS — N816 Rectocele: Secondary | ICD-10-CM | POA: Diagnosis not present

## 2022-04-01 ENCOUNTER — Ambulatory Visit: Payer: 59 | Attending: Cardiovascular Disease | Admitting: Cardiovascular Disease

## 2022-04-01 ENCOUNTER — Encounter: Payer: Self-pay | Admitting: Cardiovascular Disease

## 2022-04-01 VITALS — BP 118/68 | HR 74 | Ht 63.0 in | Wt 157.0 lb

## 2022-04-01 DIAGNOSIS — Z9861 Coronary angioplasty status: Secondary | ICD-10-CM | POA: Diagnosis not present

## 2022-04-01 DIAGNOSIS — I2510 Atherosclerotic heart disease of native coronary artery without angina pectoris: Secondary | ICD-10-CM | POA: Diagnosis not present

## 2022-04-01 DIAGNOSIS — G4733 Obstructive sleep apnea (adult) (pediatric): Secondary | ICD-10-CM

## 2022-04-01 DIAGNOSIS — I2119 ST elevation (STEMI) myocardial infarction involving other coronary artery of inferior wall: Secondary | ICD-10-CM

## 2022-04-01 DIAGNOSIS — E7849 Other hyperlipidemia: Secondary | ICD-10-CM

## 2022-04-01 NOTE — Assessment & Plan Note (Signed)
History of CAD status post inferior STEMI 09/26/2017 while walking [place].  I brought to the Cath Lab Saturday morning and opened up an occluded mid RCA with a Synergy drug-eluting stent.  She did have inferobasal hypokinesia with improved echo the following day.  She had 80% mid LAD lesion with subsequent negative Myoview stress test and I treated her medically at that point.  Because of progressive chest pressure a CTA performed 03/26/2018 again showed her LAD lesion and cath 04/22/2018 was remarkable for placement of an LAD drug-eluting stent with dilatation of a diagonal branch ostium through stent struts.  She is very active and remains on aspirin.  She denies chest pain or shortness of breath.

## 2022-04-01 NOTE — Assessment & Plan Note (Signed)
History of obstructive sleep apnea on CPAP.  She is followed by Dr. Radford Pax.

## 2022-04-01 NOTE — Patient Instructions (Signed)
Medication Instructions:  Your physician recommends that you continue on your current medications as directed. Please refer to the Current Medication list given to you today.  *If you need a refill on your cardiac medications before your next appointment, please call your pharmacy*   Lab Work: Your physician recommends that you return for lab work in: the next week or 2 for FASTING lipid/liver  If you have labs (blood work) drawn today and your tests are completely normal, you will receive your results only by: [place] (if you have MyChart) OR A paper copy in the mail If you have any lab test that is abnormal or we need to change your treatment, we will call you to review the results.   Follow-Up: At [facility], you and your health needs are our priority.  As part of our continuing mission to provide you with exceptional heart care, we have created designated Provider Care Teams.  These Care Teams include your primary Cardiologist (physician) and Advanced Practice Providers (APPs -  Physician Assistants and Nurse Practitioners) who all work together to provide you with the care you need, when you need it.  We recommend signing up for the patient portal called "MyChart".  Sign up information is provided on this After Visit Summary.  MyChart is used to connect with patients for Virtual Visits (Telemedicine).  Patients are able to view lab/test results, encounter notes, upcoming appointments, etc.  Non-urgent messages can be sent to your provider as well.   To learn more about what you can do with MyChart, go to [email or web address].    Your next appointment:   12 month(s)  The format for your next appointment:   In Person  Provider:   Quay Burow, MD

## 2022-04-01 NOTE — Progress Notes (Signed)
04/01/2022 Cathy Bell   1961-07-12  [id number]  Primary Physician Yong Channel Brayton Mars, MD Primary Cardiologist: Lorretta Harp MD FACP, [place], [place], [place]  HPI:  Cathy Bell is a 61 y.o.   moderately overweight divorced Caucasian female who I last saw  saw in the office 11/21/2020. She works as a Pharmacist, hospital.  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  She presented on 09/26/2017 with chest pain [place] the [place].  EKG showed inferior ST segment elevation with reciprocal anterior depression.  I brought her to the Cath Lab on Saturday morning and opened up an occluded mid RCA with a synergy drug-eluting stent.  She did have inferobasal hypokinesia which improved by 2D echo the following day.  She did have an 80% mid LAD lesion with a subsequent negative Myoview stress test.  Her major complaints are of fatigue.  Her other problems include hyperlipidemia on high-dose statin therapy with mildly elevated liver function tests.  She is on dual antiplatelet therapy with Brilinta.   She  had progressive chest pressure especially when walking up an incline.  A recent coronary CTA performed 03/26/2018 confirmed a moderate mid LAD lesion but unfortunately FFR could not be performed.  Recently progressive symptoms of decided to proceed with elective outpatient LAD intervention. I performed outpatient coronary intervention on her 04/22/2018 with a drug-eluting stent placed in the mid LAD.  I also dilated her diagonal branch ostium through the stent struts.  Her RCA stent was patent at that time.  Her symptoms are markedly improved.   Since I saw her  in the office a year and a half ago she continues to do well.  She exercises 2-3 times a week.  She denies chest pain or shortness of breath.  She has been diagnosed with bladder prolapse.     Current Meds  Medication Sig   aspirin  81 MG chewable tablet Chew 1 tablet (81 mg total) by mouth daily.   atorvastatin (LIPITOR) 80 MG tablet TAKE 1 TABLET BY MOUTH DAILY AT 6 PM.   cetirizine (ZYRTEC) 10 MG tablet Take 10 mg by mouth as needed for allergies.   Cholecalciferol (VITAMIN D3) 5000 units TABS Take 5,000 Units by mouth 3 (three) times a week. 0630   CRANBERRY PO Take 3 capsules by mouth 3 (three) times daily as needed (for urinary tract infection symptoms.).   D-Mannose 500 MG CAPS Take 500 mg by mouth 3 (three) times daily as needed (for urinary tract infection symptoms.).   ezetimibe (ZETIA) 10 MG tablet TAKE 1 TABLET BY MOUTH EVERY DAY   Omega-3 Fatty Acids (SUPER OMEGA-3) 1000 MG CAPS Take 1,000 mg by mouth daily. 0630   Probiotic Product (PROBIOTIC ADVANCED PO) Take 1 capsule by mouth daily at 6 (six) AM. (0630) Suprema Dophilus   sodium chloride (MURO 128) 5 % ophthalmic ointment Place 1 application into both eyes at bedtime.     Allergies  Allergen Reactions   Cefprozil Hives and Other (See Comments)   Cheese Hives   Peanut-Containing Drug Products Hives   Prednisolone     Caused dangerously high eye pressure around time of eye surgery   Salmon [Fish Allergy] Hives   Wellbutrin [Bupropion Hcl] Hives    Social History   Socioeconomic History   Marital status: Divorced    Spouse name: Not on file   Number of  children: 2   Years of education: Not on file   Highest education level: Master's degree (e.g., MA, MS, MEng, MEd, MSW, MBA)  Occupational History   Occupation: Optometrist  Tobacco Use   Smoking status: Never   Smokeless tobacco: Never  Vaping Use   Vaping Use: Never used  Substance and Sexual Activity   Alcohol use: No   Drug use: No   Sexual activity: Not Currently  Other Topics Concern   Not on file  Social History Narrative   Divorced. 2 sons [name] 26 and [name]. No grandkids yet.       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 Graduate school at [employer]: walking, surface design- Financial planner, play mahjong, movies, reading, travel   Social Determinants of Health   Financial Resource Strain: Low Risk  (01/12/2018)   Overall Financial Resource Strain (CARDIA)    Difficulty of Paying Living Expenses: Not hard at all  Food Insecurity: Unknown (01/12/2018)   Hunger Vital Sign    Worried About Running Out of Food in the Last Year: Never true    Desha in the Last Year: Not on file  Transportation Needs: No Transportation Needs (01/12/2018)   PRAPARE - Hydrologist (Medical): No    Lack of Transportation (Non-Medical): No  Physical Activity: Insufficiently Active (01/12/2018)   Exercise Vital Sign    Days of Exercise per Week: 3 days    Minutes of Exercise per Session: 30 min  Stress: Not on file  Social Connections: Not on file  Intimate Partner Violence: Not on file     Review of Systems: General: negative for chills, fever, night sweats or weight changes.  Cardiovascular: negative for chest pain, dyspnea on exertion, edema, orthopnea, palpitations, paroxysmal nocturnal dyspnea or shortness of breath Dermatological: negative for rash Respiratory: negative for cough or wheezing Urologic: negative for hematuria Abdominal: negative for nausea, vomiting, diarrhea, bright red blood per rectum, melena, or hematemesis Neurologic: negative for visual changes, syncope, or dizziness All other systems reviewed and are otherwise negative except as noted above.    Blood pressure 118/68, pulse 74, height '5\' 3"'$  (1.6 m), weight 157 lb (71.2 kg), last menstrual period 03/18/2011, SpO2 98 %.  General appearance: alert and no distress Neck: no adenopathy, no carotid bruit, no JVD, supple, symmetrical, trachea midline, and thyroid not enlarged, symmetric, no tenderness/mass/nodules Lungs: clear to auscultation  bilaterally Heart: regular rate and rhythm, S1, S2 normal, no murmur, click, rub or gallop Extremities: extremities normal, atraumatic, no cyanosis or edema Pulses: 2+ and symmetric Skin: Skin color, texture, turgor normal. No rashes or lesions Neurologic: Grossly normal  EKG sinus rhythm at 74 with left axis deviation.  I personally reviewed this EKG.  ASSESSMENT AND PLAN:   Acute ST elevation myocardial infarction (STEMI) of inferior wall (HCC) History of CAD status post inferior STEMI 09/26/2017 while walking country [place].  I brought to the Cath Lab Saturday morning and opened up an occluded mid RCA with a Synergy drug-eluting stent.  She did have inferobasal hypokinesia with improved echo the following day.  She had 80% mid LAD lesion with subsequent negative Myoview stress test and I treated her medically at that point.  Because of progressive chest pressure a CTA performed 03/26/2018 again showed her LAD lesion and cath 04/22/2018 was remarkable  for placement of an LAD drug-eluting stent with dilatation of a diagonal branch ostium through stent struts.  She is very active and remains on aspirin.  She denies chest pain or shortness of breath.  Familial hyperlipidemia History of hyperlipidemia on high-dose atorvastatin and Zetia.  Her last lipid profile performed 11/21/2020 revealed total cholesterol 127, LDL 63 and HDL of 49.  She has been on a vegan diet over the last year.  We will recheck a lipid liver profile.  She wishes to stop Zetia which we will base this on her upcoming lipid profile.  OSA (obstructive sleep apnea) History of obstructive sleep apnea on CPAP.  She is followed by Dr. Radford Pax.     Lorretta Harp MD FACP,FACC,FAHA, [facility] 04/01/2022 12:12 PM

## 2022-04-01 NOTE — Assessment & Plan Note (Signed)
History of hyperlipidemia on high-dose atorvastatin and Zetia.  Her last lipid profile performed 11/21/2020 revealed total cholesterol 127, LDL 63 and HDL of 49.  She has been on a vegan diet over the last year.  We will recheck a lipid liver profile.  She wishes to stop Zetia which we will base this on her upcoming lipid profile.

## 2022-04-07 ENCOUNTER — Encounter: Payer: Self-pay | Admitting: *Deleted

## 2022-04-22 DIAGNOSIS — N819 Female genital prolapse, unspecified: Secondary | ICD-10-CM | POA: Diagnosis not present

## 2022-04-23 DIAGNOSIS — I2510 Atherosclerotic heart disease of native coronary artery without angina pectoris: Secondary | ICD-10-CM | POA: Diagnosis not present

## 2022-04-23 DIAGNOSIS — E7849 Other hyperlipidemia: Secondary | ICD-10-CM | POA: Diagnosis not present

## 2022-04-23 DIAGNOSIS — Z9861 Coronary angioplasty status: Secondary | ICD-10-CM | POA: Diagnosis not present

## 2022-04-23 LAB — HEPATIC FUNCTION PANEL
ALT: 48 IU/L — ABNORMAL HIGH (ref 0–32)
AST: 29 IU/L (ref 0–40)
Albumin: 4.7 g/dL (ref 3.9–4.9)
Alkaline Phosphatase: 80 IU/L (ref 44–121)
Bilirubin Total: 0.6 mg/dL (ref 0.0–1.2)
Bilirubin, Direct: 0.15 mg/dL (ref 0.00–0.40)
Total Protein: 6.9 g/dL (ref 6.0–8.5)

## 2022-04-23 LAB — LIPID PANEL
Chol/HDL Ratio: 3.3 ratio (ref 0.0–4.4)
Cholesterol, Total: 121 mg/dL (ref 100–199)
HDL: 37 mg/dL — ABNORMAL LOW (ref >39)
LDL Chol Calc (NIH): 65 mg/dL (ref 0–99)
Triglycerides: 102 mg/dL (ref 0–149)
VLDL Cholesterol Cal: 19 mg/dL (ref 5–40)

## 2022-04-24 ENCOUNTER — Encounter: Payer: Self-pay | Admitting: Cardiovascular Disease

## 2022-04-25 DIAGNOSIS — H9202 Otalgia, left ear: Secondary | ICD-10-CM | POA: Diagnosis not present

## 2022-04-25 DIAGNOSIS — J018 Other acute sinusitis: Secondary | ICD-10-CM | POA: Diagnosis not present

## 2022-04-25 DIAGNOSIS — Z6828 Body mass index (BMI) 28.0-28.9, adult: Secondary | ICD-10-CM | POA: Diagnosis not present

## 2022-05-13 DIAGNOSIS — N819 Female genital prolapse, unspecified: Secondary | ICD-10-CM | POA: Diagnosis not present

## 2022-05-30 ENCOUNTER — Encounter: Payer: Self-pay | Admitting: Cardiovascular Disease

## 2022-05-30 MED ORDER — NITROGLYCERIN 0.4 MG SL SUBL: 0.4000 mg | SUBLINGUAL_TABLET | SUBLINGUAL | 3 refills | Status: AC | PRN

## 2022-05-30 NOTE — Telephone Encounter (Signed)
Spoke with pt regarding her mychart message that was sent in today. Pt complains that she is beginning to notice some chest tightness and discomfort of the last week or so. Pt states that she noticed some indigestion in the evening that is unusual to her because of her diet modifications. Pt did notice chest tightness after doing the stairs yesterday and this caught her attention. Pt has not tried SL nitroglycerin for relief of symptoms. Pt states that her bottle is pretty old and she has not refilled in quite a while. Scheduled pt office visit with Laurann Montana, NP on Tuesday, 11/21. Also, sent in refill for SL nitroglycerin. Pt will pick these up and have on hand if she feels chest tightness again. Pt verbalizes understanding.

## 2022-06-02 ENCOUNTER — Other Ambulatory Visit: Payer: Self-pay | Admitting: Cardiovascular Disease

## 2022-06-03 ENCOUNTER — Other Ambulatory Visit (HOSPITAL_COMMUNITY): Payer: Self-pay

## 2022-06-03 ENCOUNTER — Ambulatory Visit (INDEPENDENT_AMBULATORY_CARE_PROVIDER_SITE_OTHER): Payer: 59 | Admitting: Family

## 2022-06-03 ENCOUNTER — Encounter (HOSPITAL_BASED_OUTPATIENT_CLINIC_OR_DEPARTMENT_OTHER): Payer: Self-pay | Admitting: Family

## 2022-06-03 VITALS — BP 94/68 | HR 67 | Ht 63.0 in | Wt 159.0 lb

## 2022-06-03 DIAGNOSIS — I25118 Atherosclerotic heart disease of native coronary artery with other forms of angina pectoris: Secondary | ICD-10-CM | POA: Diagnosis not present

## 2022-06-03 DIAGNOSIS — E785 Hyperlipidemia, unspecified: Secondary | ICD-10-CM | POA: Diagnosis not present

## 2022-06-03 DIAGNOSIS — R072 Precordial pain: Secondary | ICD-10-CM | POA: Diagnosis not present

## 2022-06-03 DIAGNOSIS — G4733 Obstructive sleep apnea (adult) (pediatric): Secondary | ICD-10-CM

## 2022-06-03 MED ORDER — IVABRADINE HCL 5 MG PO TABS
ORAL_TABLET | ORAL | 0 refills | Status: DC
  Filled 2022-06-03: qty 2, 1d supply, fill #0

## 2022-06-03 NOTE — Addendum Note (Signed)
Addended by: Gerald Stabs on: 06/03/2022 05:10 PM   Modules accepted: Orders

## 2022-06-03 NOTE — Progress Notes (Signed)
Office Visit    Patient Name: Cathy Bell Date of Encounter: 06/03/2022  PCP:  Marin Olp, [place]  Cardiologist:  Quay Burow, MD  Advanced Practice Provider:  No care team member to display Electrophysiologist:  None      Chief Complaint    Cathy Bell is a 61 y.o. female presents today for chest pain   Past Medical History    Past Medical History:  Diagnosis Date   Acute ST elevation myocardial infarction (STEMI) of inferior wall ([place]) 09/26/2017   Pt presented 09/26/17 with an acute inferior STEMI. Door to balloon time 30 minutes.    Coronary artery disease    Distended abdomen    Heart disease    History of Epstein-Barr virus infection    Hyperlipidemia    on meds   IBS (irritable bowel syndrome)    OSA (obstructive sleep apnea) 08/19/2018    mild obstructive sleep apnea with an AHI of 10.6/h and oxygen desaturations as low as 88%.   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   Seasonal allergies    Sleep apnea 2007   uses CPAP   Past Surgical History:  Procedure Laterality Date   CARDIAC CATHETERIZATION     CORONARY ANGIOPLASTY WITH STENT PLACEMENT  04/22/2018   CORONARY STENT INTERVENTION N/A 04/22/2018   Procedure: CORONARY STENT INTERVENTION;  Surgeon: Lorretta Harp, MD;  Location: [facility];  Service: Cardiovascular;  Laterality: N/A;   CORONARY/GRAFT ACUTE MI REVASCULARIZATION N/A 09/26/2017   Procedure: Coronary/Graft Acute MI Revascularization;  Surgeon: Lorretta Harp, MD;  Location: [facility];  Service: Cardiovascular;  Laterality: N/A;   LEFT HEART CATH AND CORONARY ANGIOGRAPHY N/A 09/26/2017   Procedure: LEFT HEART CATH AND CORONARY ANGIOGRAPHY;  Surgeon: Lorretta Harp, MD;  Location: [facility];  Service: Cardiovascular;  Laterality: N/A;   nodulectomy  2018   Salzmann Nodular Degeneration x 2 procedures (bilateral eyes sx)   STENT  PLACEMENT VASCULAR ([place] HX)  2019   TONSILLECTOMY AND ADENOIDECTOMY  1967   WISDOM TOOTH EXTRACTION  1981    Allergies  Allergies  Allergen Reactions   Cefprozil Hives and Other (See Comments)   Cheese Hives   Peanut-Containing Drug Products Hives   Prednisolone     Caused dangerously high eye pressure around time of eye surgery   Salmon [Fish Allergy] Hives   Wellbutrin [Bupropion Hcl] Hives    History of Present Illness    Cathy Bell is a 61 y.o. female with a hx of CAD, OSA, hyperlipidemia last seen 04/01/2022.  Presented 09/26/2017 with chest pain with EKG revealing inferior STEMI.  LHC with occluded mid RCA treated with DES.  She had 80% mid LAD lesion with subsequent negative Myoview stress test.  Coronary CTA 03/26/2018 confirmed moderate mid LAD lesion but unfortunately FFR could not be performed.  Due to persistent symptoms underwent outpatient LAD intervention 04/22/2018 with DES.  RCA stent was patent at that time.  Last seen 04/01/2022 doing well from a cardiac perspective.  She was exercising regularly without angina and undergoing work-up for bladder prolapse.  Presents today after contacting the office noting chest discomfort. Tells me a about a week ago started to feel "weird" and had more difficulty with her stairs, more trouble with the hill on her 4 miles walk with feeling of fatigue, exercise intolerance. When she laid down at night felt  more indigestion-like symptoms. This is similar to her anginal equivalent. Also feels lightheaded/dizziness describes as both needing to sit down and a spinning sensation. Most notable when she is not moving but not at time of position changes. Also notes some tightness around her lower back which she also experienced prior to her previous stents. Prior to this was completing 45 minute sessions of light aerobic exercises without difficulty but now notes more difficult.   EKGs/Labs/Other Studies Reviewed:   The following  studies were reviewed today:  Coronary stent intervention 04/22/18   Previously placed Prox RCA to Mid RCA stent (unknown type) is widely patent. Prox LAD-1 lesion is 50% stenosed. Prox LAD-2 lesion is 75% stenosed. Ost 1st Diag lesion is 80% stenosed. A stent was successfully placed. Post intervention, there is a 0% residual stenosis. Post intervention, there is a 0% residual stenosis.  EKG:  EKG is ordered today.  The ekg ordered today demonstrates NSR 67 bpm with LAFB, no acute St/T wave changes. Overall unchanged compared to previous.   Recent Labs: 04/23/2022: ALT 48  Recent Lipid Panel    Component Value Date/Time   CHOL 121 04/23/2022 0916   TRIG 102 04/23/2022 0916   HDL 37 (L) 04/23/2022 0916   CHOLHDL 3.3 04/23/2022 0916   CHOLHDL 4.6 09/26/2017 2334   VLDL 12 09/26/2017 2334   LDLCALC 65 04/23/2022 0916   LDLDIRECT 91.0 11/03/2017 1451   Home Medications   Current Meds  Medication Sig   aspirin 81 MG chewable tablet Chew 1 tablet (81 mg total) by mouth daily.   atorvastatin (LIPITOR) 80 MG tablet TAKE 1 TABLET BY MOUTH DAILY AT 6 PM.   cetirizine (ZYRTEC) 10 MG tablet Take 10 mg by mouth as needed for allergies.   Cholecalciferol (VITAMIN D3) 5000 units TABS Take 5,000 Units by mouth 3 (three) times a week. 0630   CRANBERRY PO Take 3 capsules by mouth 3 (three) times daily as needed (for urinary tract infection symptoms.).   D-Mannose 500 MG CAPS Take 500 mg by mouth 3 (three) times daily as needed (for urinary tract infection symptoms.).   estradiol (ESTRACE) 0.1 MG/GM vaginal cream Place vaginally 2 (two) times a week.   ezetimibe (ZETIA) 10 MG tablet TAKE 1 TABLET BY MOUTH EVERY DAY   ivabradine (CORLANOR) 5 MG TABS tablet Take two tablets two hours prior to cardiac CTA.   nitroGLYCERIN (NITROSTAT) 0.4 MG SL tablet Place 1 tablet (0.4 mg total) under the tongue every 5 (five) minutes x 3 doses as needed for chest pain.   Omega-3 Fatty Acids (SUPER OMEGA-3) 1000  MG CAPS Take 1,000 mg by mouth daily. 0630   Probiotic Product (PROBIOTIC ADVANCED PO) Take 1 capsule by mouth daily at 6 (six) AM. (0630) Suprema Dophilus   sodium chloride (MURO 128) 5 % ophthalmic ointment Place 1 application into both eyes at bedtime.     Review of Systems      All other systems reviewed and are otherwise negative except as noted above.  Physical Exam    VS:  BP 94/68   Pulse 67   Ht '5\' 3"'$  (1.6 m)   Wt 159 lb (72.1 kg)   LMP 03/18/2011   BMI 28.17 kg/m  , BMI Body mass index is 28.17 kg/m.  Wt Readings from Last 3 Encounters:  06/03/22 159 lb (72.1 kg)  04/01/22 157 lb (71.2 kg)  10/11/21 150 lb (68 kg)    GEN: Well nourished, well developed, in no acute distress.  HEENT: normal. Neck: Supple, no JVD, carotid bruits, or masses. Cardiac: RRR, no murmurs, rubs, or gallops. No clubbing, cyanosis, edema.  Radials/PT 2+ and equal bilaterally.  Respiratory:  Respirations regular and unlabored, clear to auscultation bilaterally. GI: Soft, nontender, nondistended. MS: No deformity or atrophy. Skin: Warm and dry, no rash. Neuro:  Strength and sensation are intact. Psych: Normal affect.  Assessment & Plan    CAD- 1 week history of chest discomfort similar to anginal equivalent. Describes as indigestion waking her at night which is anginal equivalent. Also notes exercise intolerance. GDMT includes aspirin, atorvastatin, zetia. Has not required nitroglycerin. Relative hypotension, will not add Amlodipine or Ranexa or Imdur at this time. Plan for cardiac CTA. Given relative hypotension, will utilize Ivabradine '10mg'$  two hours prior to study. BMP today for monitoring of renal function.   Hyperlipidemia, LDL goal <70 - 04/23/22 LDL 65. Follows a vegan diet. Continue Atorvastatin '80mg'$  daily, Zetia '10mg'$  daily.   OSA- Reports compliance with her CPAP. CPAP compliance encouraged.         Disposition: Follow up  pending results of CTA  with Quay Burow, MD or  APP.  Signed, Loel Dubonnet, NP 06/03/2022, 4:41 PM [facility]

## 2022-06-03 NOTE — Patient Instructions (Addendum)
Medication Instructions:  Continue your same medications.   Take 2 tablets  of Ivabradine two hours prior to cardiac CTA.  *If you need a refill on your cardiac medications before your next appointment, please call your pharmacy*  Lab Work: Your physician recommends that you return for lab work today: BMP  If you have labs (blood work) drawn today and your tests are completely normal, you will receive your results only by: [place] (if you have MyChart) OR A paper copy in the mail If you have any lab test that is abnormal or we need to change your treatment, we will call you to review the results.  Testing/Procedures: EKG was stable today  Follow-Up: At [facility], you and your health needs are our priority.  As part of our continuing mission to provide you with exceptional heart care, we have created designated Provider Care Teams.  These Care Teams include your primary Cardiologist (physician) and Advanced Practice Providers (APPs -  Physician Assistants and Nurse Practitioners) who all work together to provide you with the care you need, when you need it.  We recommend signing up for the patient portal called "MyChart".  Sign up information is provided on this After Visit Summary.  MyChart is used to connect with patients for Virtual Visits (Telemedicine).  Patients are able to view lab/test results, encounter notes, upcoming appointments, etc.  Non-urgent messages can be sent to your provider as well.   To learn more about what you can do with MyChart, go to [email or web address].    Your next appointment:   Pending results of CT scan  Other Instructions    Your cardiac CT will be scheduled at one of the below locations:   [facility] [address] [place], [place] [address] [phone]  If scheduled at [facility], please arrive at the [facility] and Children's Entrance (Entrance C2) of [facility] 30 minutes prior to test  start time. You can use the FREE valet parking offered at entrance C (encouraged to control the heart rate for the test)  Proceed to the [facility] Radiology Department (first floor) to check-in and test prep.  All radiology patients and guests should use entrance C2 at [facility], accessed from [facility], even though the hospital's physical address listed is [address]     Please follow these instructions carefully (unless otherwise directed):  On the Night Before the Test: Be sure to Drink plenty of water. Do not consume any caffeinated/decaffeinated beverages or chocolate 12 hours prior to your test. Do not take any antihistamines 12 hours prior to your test.  On the Day of the Test: Drink plenty of water until 1 hour prior to the test. Do not eat any food 1 hour prior to test. You may take your regular medications prior to the test.  Take Ivabradine (corlanor) '10mg'$  (two tablets) two hours prior to test. FEMALES- please wear underwire-free bra if available, avoid dresses & tight clothing      After the Test: Drink plenty of water. After receiving IV contrast, you may experience a mild flushed feeling. This is normal. On occasion, you may experience a mild rash up to 24 hours after the test. This is not dangerous. If this occurs, you can take Benadryl 25 mg and increase your fluid intake. If you experience trouble breathing, this can be serious. If it is severe call 911 IMMEDIATELY. If it is mild, please call our office. If  you take any of these medications: Glipizide/Metformin, Avandament, Glucavance, please do not take 48 hours after completing test unless otherwise instructed.  We will call to schedule your test 2-4 weeks out understanding that some insurance companies will need an authorization prior to the service being performed.   For non-scheduling related questions, please contact the cardiac imaging nurse navigator should you have any  questions/concerns: Marchia Bond, Cardiac Imaging Nurse Navigator Gordy Clement, Cardiac Imaging Nurse Navigator [facility] Direct Office Dial: [phone]   For scheduling needs, including cancellations and rescheduling, please call [place], [phone].

## 2022-06-04 LAB — BASIC METABOLIC PANEL
BUN/Creatinine Ratio: 22 (ref 12–28)
BUN: 15 mg/dL (ref 8–27)
CO2: 31 mmol/L — ABNORMAL HIGH (ref 20–29)
Calcium: 9.9 mg/dL (ref 8.7–10.3)
Chloride: 105 mmol/L (ref 96–106)
Creatinine, Ser: 0.67 mg/dL (ref 0.57–1.00)
Glucose: 84 mg/dL (ref 70–99)
Potassium: 4.6 mmol/L (ref 3.5–5.2)
Sodium: 146 mmol/L — ABNORMAL HIGH (ref 134–144)
eGFR: 99 mL/min/{1.73_m2} (ref >59)

## 2022-06-12 ENCOUNTER — Telehealth (HOSPITAL_BASED_OUTPATIENT_CLINIC_OR_DEPARTMENT_OTHER): Payer: Self-pay | Admitting: Family

## 2022-06-12 ENCOUNTER — Encounter (HOSPITAL_BASED_OUTPATIENT_CLINIC_OR_DEPARTMENT_OTHER): Payer: Self-pay

## 2022-06-12 DIAGNOSIS — I2119 ST elevation (STEMI) myocardial infarction involving other coronary artery of inferior wall: Secondary | ICD-10-CM

## 2022-06-12 DIAGNOSIS — I2510 Atherosclerotic heart disease of native coronary artery without angina pectoris: Secondary | ICD-10-CM

## 2022-06-12 DIAGNOSIS — R072 Precordial pain: Secondary | ICD-10-CM

## 2022-06-12 NOTE — Telephone Encounter (Signed)
Myoview scheduled.   Loel Dubonnet, NP

## 2022-06-12 NOTE — Addendum Note (Signed)
Addended by: Loel Dubonnet on: 06/12/2022 01:35 PM   Modules accepted: Orders

## 2022-06-12 NOTE — Telephone Encounter (Signed)
Can you help me schedule a lexi please?

## 2022-06-12 NOTE — Telephone Encounter (Signed)
CTA appointment and order cancelled, lexiscan order placed.

## 2022-06-12 NOTE — Telephone Encounter (Addendum)
Case discussed with Dr. Gwenlyn Found. Given prior stenting, will change cardiac CT to lexiscan myoview to allow for better imaging of previously placed stent.   Will route to [place] as FYI to cancel cardiac CT.  Will route to [facility] for assistance scheduled lexiscan myoview.   Risk/benefit of lexiscan myoview discussed during previous OV.   Shared Decision Making/Informed Consent  The risks [chest pain, shortness of breath, cardiac arrhythmias, dizziness, blood pressure fluctuations, myocardial infarction, stroke/transient ischemic attack, nausea, vomiting, allergic reaction, radiation exposure, metallic taste sensation and life-threatening complications (estimated to be 1 in 10,000)], benefits (risk stratification, diagnosing coronary artery disease, treatment guidance) and alternatives of a nuclear stress test were discussed in detail with Cathy Bell and she agrees to proceed.   Loel Dubonnet, NP

## 2022-06-12 NOTE — Telephone Encounter (Signed)
Left message for patient concerning the Tuesday 06/17/22 8:00 am Cathy Bell appointment at our [facility] location---[address]--arrival time is 7:45 am---someone will call patient tomorrow or Monday with instructions.  Requested patient call with questions or concerns.     My Chart message was also sent with this information

## 2022-06-12 NOTE — Telephone Encounter (Signed)
Patient responding

## 2022-06-13 ENCOUNTER — Telehealth (HOSPITAL_COMMUNITY): Payer: Self-pay | Admitting: *Deleted

## 2022-06-13 NOTE — Telephone Encounter (Signed)
Per DPR left detailed instructions on answering machine for MPI study on 06/17/22.

## 2022-06-17 ENCOUNTER — Ambulatory Visit (HOSPITAL_COMMUNITY): Payer: 59

## 2022-06-17 ENCOUNTER — Encounter (HOSPITAL_COMMUNITY): Payer: 59

## 2022-06-17 ENCOUNTER — Ambulatory Visit (HOSPITAL_COMMUNITY): Payer: 59 | Attending: Internal Medicine

## 2022-06-17 ENCOUNTER — Encounter (HOSPITAL_COMMUNITY): Payer: Self-pay

## 2022-06-17 DIAGNOSIS — R072 Precordial pain: Secondary | ICD-10-CM | POA: Insufficient documentation

## 2022-06-17 DIAGNOSIS — Z9861 Coronary angioplasty status: Secondary | ICD-10-CM | POA: Insufficient documentation

## 2022-06-17 DIAGNOSIS — I2510 Atherosclerotic heart disease of native coronary artery without angina pectoris: Secondary | ICD-10-CM | POA: Diagnosis not present

## 2022-06-17 MED ORDER — TECHNETIUM TC 99M TETROFOSMIN IV KIT
10.1000 | PACK | Freq: Once | INTRAVENOUS | Status: AC | PRN
  Administered 2022-06-17: 10.1 via INTRAVENOUS

## 2022-06-17 MED ORDER — TECHNETIUM TC 99M TETROFOSMIN IV KIT
32.1000 | PACK | Freq: Once | INTRAVENOUS | Status: AC | PRN
  Administered 2022-06-17: 32.1 via INTRAVENOUS

## 2022-06-17 MED ORDER — REGADENOSON 0.4 MG/5ML IV SOLN
0.4000 mg | Freq: Once | INTRAVENOUS | Status: AC
  Administered 2022-06-17: 0.4 mg via INTRAVENOUS

## 2022-06-18 ENCOUNTER — Encounter (HOSPITAL_COMMUNITY): Payer: 59

## 2022-06-18 LAB — MYOCARDIAL PERFUSION IMAGING
LV dias vol: 77 mL (ref 46–106)
LV sys vol: 23 mL
Nuc Stress EF: 70 %
Peak HR: 100 {beats}/min
Rest HR: 60 {beats}/min
Rest Nuclear Isotope Dose: 10.1 mCi
SDS: 2
SRS: 0
SSS: 2
ST Depression (mm): 0 mm
Stress Nuclear Isotope Dose: 32.1 mCi
TID: 0.97

## 2022-10-22 ENCOUNTER — Telehealth: Payer: Self-pay | Admitting: Cardiology

## 2022-10-22 ENCOUNTER — Encounter (HOSPITAL_BASED_OUTPATIENT_CLINIC_OR_DEPARTMENT_OTHER): Payer: Self-pay

## 2022-10-22 DIAGNOSIS — I25118 Atherosclerotic heart disease of native coronary artery with other forms of angina pectoris: Secondary | ICD-10-CM

## 2022-10-22 DIAGNOSIS — G4733 Obstructive sleep apnea (adult) (pediatric): Secondary | ICD-10-CM

## 2022-10-22 NOTE — Telephone Encounter (Signed)
Returned call: Patient feels like she is not sleeping well with her cpap. She is waking up with dry mouth which is a new problem. The air blows too strong and makes a noise at night. Patient states her machine is 63 years old. Patient states she also has a travel cpap and would like to have them both adjusted.

## 2022-10-22 NOTE — Telephone Encounter (Signed)
Patient would like a call back to discuss update on her sleep care.

## 2022-10-24 NOTE — Telephone Encounter (Signed)
Upon patient request DME selection is [facility] Patient understands he will be contacted by [facility] to set up his cpap. Patient understands to call if [facility] does not contact him with new setup in a timely manner. Patient understands they will be called once confirmation has been received from [facility] that they have received their new machine to schedule 10 week follow up appointment.  [facility] notified of new cpap order  Please add to airview Patient was grateful for the call and thanked me. 

## 2022-10-24 NOTE — Telephone Encounter (Signed)
Per Dr Mayford Knife, Order ResMed auto CPAP from 4 to 18cm H2O with heated humidity and mask of choice

## 2022-11-20 ENCOUNTER — Other Ambulatory Visit: Payer: Self-pay | Admitting: Cardiovascular Disease

## 2022-12-19 DIAGNOSIS — R21 Rash and other nonspecific skin eruption: Secondary | ICD-10-CM | POA: Diagnosis not present

## 2022-12-28 DIAGNOSIS — J0100 Acute maxillary sinusitis, unspecified: Secondary | ICD-10-CM | POA: Diagnosis not present

## 2023-01-01 ENCOUNTER — Other Ambulatory Visit: Payer: Self-pay | Admitting: Obstetrics and Gynecology

## 2023-01-01 DIAGNOSIS — Z0000 Encounter for general adult medical examination without abnormal findings: Secondary | ICD-10-CM

## 2023-01-02 ENCOUNTER — Encounter: Payer: Self-pay | Admitting: Cardiovascular Disease

## 2023-01-05 NOTE — Telephone Encounter (Signed)
Spoke to patient Dr.Jordan's advice given. 

## 2023-02-11 DIAGNOSIS — H2513 Age-related nuclear cataract, bilateral: Secondary | ICD-10-CM | POA: Diagnosis not present

## 2023-02-11 DIAGNOSIS — H53143 Visual discomfort, bilateral: Secondary | ICD-10-CM | POA: Diagnosis not present

## 2023-02-11 DIAGNOSIS — H18593 Other hereditary corneal dystrophies, bilateral: Secondary | ICD-10-CM | POA: Diagnosis not present

## 2023-02-16 ENCOUNTER — Ambulatory Visit
Admission: RE | Admit: 2023-02-16 | Discharge: 2023-02-16 | Disposition: A | Discharge status: Home / Self Care | Payer: BC Managed Care – PPO | Source: Ambulatory Visit | Attending: Obstetrics and Gynecology | Admitting: Obstetrics and Gynecology

## 2023-02-16 DIAGNOSIS — Z1231 Encounter for screening mammogram for malignant neoplasm of breast: Secondary | ICD-10-CM | POA: Diagnosis not present

## 2023-02-16 DIAGNOSIS — Z0000 Encounter for general adult medical examination without abnormal findings: Secondary | ICD-10-CM

## 2023-03-25 DIAGNOSIS — Z6828 Body mass index (BMI) 28.0-28.9, adult: Secondary | ICD-10-CM | POA: Diagnosis not present

## 2023-03-25 DIAGNOSIS — Z01419 Encounter for gynecological examination (general) (routine) without abnormal findings: Secondary | ICD-10-CM | POA: Diagnosis not present

## 2023-03-29 ENCOUNTER — Encounter: Payer: Self-pay | Admitting: Family Medicine

## 2023-05-18 ENCOUNTER — Other Ambulatory Visit: Payer: Self-pay | Admitting: Cardiovascular Disease

## 2023-05-20 ENCOUNTER — Other Ambulatory Visit: Payer: Self-pay | Admitting: Family

## 2023-05-28 DIAGNOSIS — H0100A Unspecified blepharitis right eye, upper and lower eyelids: Secondary | ICD-10-CM | POA: Diagnosis not present

## 2023-05-28 DIAGNOSIS — H53143 Visual discomfort, bilateral: Secondary | ICD-10-CM | POA: Diagnosis not present

## 2023-05-28 DIAGNOSIS — H0100B Unspecified blepharitis left eye, upper and lower eyelids: Secondary | ICD-10-CM | POA: Diagnosis not present

## 2023-06-11 ENCOUNTER — Other Ambulatory Visit: Payer: Self-pay | Admitting: Cardiovascular Disease

## 2023-06-22 ENCOUNTER — Other Ambulatory Visit: Payer: Self-pay | Admitting: Cardiovascular Disease

## 2023-07-16 ENCOUNTER — Other Ambulatory Visit: Payer: Self-pay | Admitting: Cardiovascular Disease

## 2023-07-17 NOTE — Telephone Encounter (Signed)
 Patient request ezetimibe (ZETIA) 10 MG tablet. Pt has already had three refill request. Last office visit was 06/03/22. She doesn't have an appointment scheduled either.

## 2023-08-19 ENCOUNTER — Ambulatory Visit: Payer: BC Managed Care – PPO | Admitting: Family Medicine

## 2023-08-19 ENCOUNTER — Encounter: Payer: Self-pay | Admitting: Family Medicine

## 2023-08-19 ENCOUNTER — Ambulatory Visit (INDEPENDENT_AMBULATORY_CARE_PROVIDER_SITE_OTHER): Payer: BC Managed Care – PPO

## 2023-08-19 VITALS — BP 104/68 | HR 80 | Temp 98.4°F | Resp 18 | Ht 63.0 in | Wt 164.0 lb

## 2023-08-19 DIAGNOSIS — M5136 Other intervertebral disc degeneration, lumbar region with discogenic back pain only: Secondary | ICD-10-CM

## 2023-08-19 DIAGNOSIS — M5440 Lumbago with sciatica, unspecified side: Secondary | ICD-10-CM | POA: Diagnosis not present

## 2023-08-19 DIAGNOSIS — M545 Low back pain, unspecified: Secondary | ICD-10-CM | POA: Diagnosis not present

## 2023-08-19 DIAGNOSIS — M47816 Spondylosis without myelopathy or radiculopathy, lumbar region: Secondary | ICD-10-CM | POA: Diagnosis not present

## 2023-08-19 LAB — POC URINALSYSI DIPSTICK (AUTOMATED)
Bilirubin, UA: NEGATIVE
Blood, UA: NEGATIVE
Glucose, UA: NEGATIVE
Ketones, UA: NEGATIVE
Leukocytes, UA: NEGATIVE
Nitrite, UA: NEGATIVE
Protein, UA: NEGATIVE
Spec Grav, UA: 1.01 (ref 1.010–1.025)
Urobilinogen, UA: 0.2 U/dL
pH, UA: 7 (ref 5.0–8.0)

## 2023-08-19 MED ORDER — PREDNISONE 20 MG PO TABS
40.0000 mg | ORAL_TABLET | Freq: Every day | ORAL | 0 refills | Status: AC
Start: 2023-08-19 — End: 2023-08-24

## 2023-08-19 MED ORDER — CYCLOBENZAPRINE HCL 10 MG PO TABS: 10.0000 mg | ORAL_TABLET | Freq: Every day | ORAL | 0 refills | Status: DC

## 2023-08-19 NOTE — Progress Notes (Signed)
 Subjective:     Patient ID: Cathy Bell, female    DOB: 09-09-1960, 63 y.o.   MRN: [id number]  Chief Complaint  Patient presents with   Back Pain    Lower back pain that started Monday morning, unable to move, feels like everything is locked up, radiating around to the front Took Tylenol  10 am this morning    HPI Discussed the use of AI scribe software for clinical note transcription with the patient, who gave verbal consent to proceed.  History of Present Illness   Cathy Bell is a 63 year old female with a history of coronary artery disease who presents with severe lower back pain radiating to the abdomen.  She has been experiencing severe lower back pain since Monday morning, which began upon waking. The pain initially felt like typical back pain but has since evolved into significant discomfort. It is particularly severe in the lower back and worsens after periods of immobility, such as lying down. She experiences significant stiffness upon waking, making it difficult to get out of bed, and describes having to 'inch herself off the bed' due to the severity of the pain.  The pain radiates from the lower back to the lower abdomen, causing her stomach to protrude. She finds it difficult to describe whether the pain is originating from bones or muscles. The pain is exacerbated by certain movements, such as getting up from bed, going to the bathroom, and driving. She finds it difficult to bend forward without bending her knees and experiences locking of her legs when flat or sitting. She describes the sensation as lacking the strength to pull her legs up, stating 'it's like this stopped working.' Standing for prolonged periods can lead to locking up, as experienced during a meeting where she stood for an hour..  feels like someone is pulling down on pelvis  No burning sensation during urination, but she mentions feeling swollen and considers the possibility of a urinary tract  infection. She has a history of E. coli infections, which previously caused severe back pain, but she does not feel sick to her stomach this time.  She has been on atorvastatin  and Zetia  for five years following a heart attack in 2019. She also started using estradiol vaginal cream for a prolapse, applying it twice a week, but discontinued it after considering it might be related to her symptoms.  She recalls moving heavy items the previous Thursday or Friday, which she initially thought might have contributed to the pain, but she did not experience any pain over the weekend. She has been using Vicks VapoRub for relief and is considering trying ice packs.       Health Maintenance Due  Topic Date Due   Cervical Cancer Screening (HPV/Pap Cotest)  12/05/2018    Past Medical History:  Diagnosis Date   Acute ST elevation myocardial infarction (STEMI) of inferior wall (HCC) 09/26/2017   Pt presented 09/26/17 with an acute inferior STEMI. Door to balloon time 30 minutes.    Coronary artery disease    Distended abdomen    Heart disease    History of Epstein-Barr virus infection    Hyperlipidemia    on meds   IBS (irritable bowel syndrome)    OSA (obstructive sleep apnea) 08/19/2018    mild obstructive sleep apnea with an AHI of 10.6/h and oxygen desaturations as low as 88%.   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   Seasonal allergies  Sleep apnea 2007   uses CPAP    Past Surgical History:  Procedure Laterality Date   CARDIAC CATHETERIZATION     CORONARY ANGIOPLASTY WITH STENT PLACEMENT  04/22/2018   CORONARY STENT INTERVENTION N/A 04/22/2018   Procedure: CORONARY STENT INTERVENTION;  Surgeon: Court Dorn PARAS, MD;  Location: [facility];  Service: Cardiovascular;  Laterality: N/A;   CORONARY/GRAFT ACUTE MI REVASCULARIZATION N/A 09/26/2017   Procedure: Coronary/Graft Acute MI Revascularization;  Surgeon: Court Dorn PARAS, MD;  Location: [facility];   Service: Cardiovascular;  Laterality: N/A;   LEFT HEART CATH AND CORONARY ANGIOGRAPHY N/A 09/26/2017   Procedure: LEFT HEART CATH AND CORONARY ANGIOGRAPHY;  Surgeon: Court Dorn PARAS, MD;  Location: [facility];  Service: Cardiovascular;  Laterality: N/A;   nodulectomy  2018   Salzmann Nodular Degeneration x 2 procedures (bilateral eyes sx)   STENT PLACEMENT VASCULAR ([facility] HX)  2019   TONSILLECTOMY AND ADENOIDECTOMY  1967   WISDOM TOOTH EXTRACTION  1981     Current Outpatient Medications:    aspirin  81 MG chewable tablet, Chew 1 tablet (81 mg total) by mouth daily., Disp: 90 tablet, Rfl: 3   atorvastatin  (LIPITOR ) 80 MG tablet, TAKE 1 TABLET BY MOUTH DAILY AT 6 PM., Disp: 30 tablet, Rfl: 11   cetirizine (ZYRTEC) 10 MG tablet, Take 10 mg by mouth as needed for allergies., Disp: , Rfl:    Cholecalciferol (VITAMIN D3) 5000 units TABS, Take 5,000 Units by mouth 3 (three) times a week. 0630, Disp: , Rfl:    CRANBERRY PO, Take 3 capsules by mouth 3 (three) times daily as needed (for urinary tract infection symptoms.)., Disp: , Rfl:    cyclobenzaprine  (FLEXERIL ) 10 MG tablet, Take 1 tablet (10 mg total) by mouth at bedtime., Disp: 10 tablet, Rfl: 0   D-Mannose 500 MG CAPS, Take 500 mg by mouth 3 (three) times daily as needed (for urinary tract infection symptoms.)., Disp: , Rfl:    estradiol (ESTRACE) 0.1 MG/GM vaginal cream, Place vaginally 2 (two) times a week., Disp: , Rfl:    ezetimibe  (ZETIA ) 10 MG tablet, TAKE 1 TABLET BY MOUTH EVERY DAY, Disp: 90 tablet, Rfl: 0   nitroGLYCERIN  (NITROSTAT ) 0.4 MG SL tablet, Place 1 tablet (0.4 mg total) under the tongue every 5 (five) minutes x 3 doses as needed for chest pain., Disp: 25 tablet, Rfl: 3   predniSONE  (DELTASONE ) 20 MG tablet, Take 2 tablets (40 mg total) by mouth daily with breakfast for 5 days., Disp: 10 tablet, Rfl: 0   Probiotic Product (PROBIOTIC ADVANCED PO), Take 1 capsule by mouth daily at 6 (six) AM. (0630) Suprema Dophilus,  Disp: , Rfl:    sodium chloride  (MURO 128) 5 % ophthalmic ointment, Place 1 application into both eyes at bedtime., Disp: , Rfl:   Allergies  Allergen Reactions   Bupropion Hives    Other reaction(s): Unknown  Other Reaction(s): Other (See Comments)   Cefprozil Hives and Other (See Comments)    Other Reaction(s): Other (See Comments)  Other reaction(s): Unknown   Cheese Hives   Fish Allergy Hives   Peanut-Containing Drug Products Hives   Prednisolone     Caused dangerously high eye pressure around time of eye surgery   Wellbutrin [Bupropion Hcl] Hives   ROS neg/noncontributory except as noted HPI/below      Objective:     BP 104/68   Pulse 80   Temp 98.4 F (36.9 C) (Temporal)   Resp 18  Ht 5' 3 (1.6 m)   Wt 164 lb (74.4 kg)   LMP 03/18/2011   SpO2 100%   BMI 29.05 kg/m  Wt Readings from Last 3 Encounters:  08/19/23 164 lb (74.4 kg)  06/17/22 159 lb (72.1 kg)  06/03/22 159 lb (72.1 kg)    Physical Exam   Gen: WDWN in pain  can't be still HEENT: NCAT, conjunctiva not injected, sclera nonicteric ABDOMEN:  BS+, soft, NTND, No HSM, no masses EXT:  no edema MSK: Back:  can stand on heels/toes/1 leg.  No TTPspine.  + B SI tenderness. No rash.  MS 5/5 BLE.  DTR 2+ BLE.  SLR neg B.  Pain w/flex/ext.SABRA  NEURO: A&O x3.  CN II-XII intact.  PSYCH: normal mood. Good eye contact  Results for orders placed or performed in visit on 08/19/23  POCT Urinalysis Dipstick (Automated)   Collection Time: 08/19/23  4:09 PM  Result Value Ref Range   Color, UA YELLOW    Clarity, UA CLEAR    Glucose, UA Negative Negative   Bilirubin, UA NEG    Ketones, UA NEG    Spec Grav, UA 1.010 1.010 - 1.025   Blood, UA NEG    pH, UA 7.0 5.0 - 8.0   Protein, UA Negative Negative   Urobilinogen, UA 0.2 0.2 or 1.0 E.U./dL   Nitrite, UA NEG    Leukocytes, UA Negative Negative       Assessment & Plan:  Acute bilateral low back pain with sciatica, sciatica laterality unspecified -      POCT Urinalysis Dipstick (Automated) -     DG Lumbar Spine 2-3 Views; Future  Other orders -     predniSONE ; Take 2 tablets (40 mg total) by mouth daily with breakfast for 5 days.  Dispense: 10 tablet; Refill: 0 -     Cyclobenzaprine  HCl; Take 1 tablet (10 mg total) by mouth at bedtime.  Dispense: 10 tablet; Refill: 0  Assessment and Plan    Lower Back Pain Acute severe lower back pain radiates to the lower abdomen, worsening with immobility and certain movements like getting out of bed and descending stairs. No trauma or unusual activity immediately preceded the onset, though heavy lifting occurred days prior. Differential diagnosis includes muscle strain, sacroiliac joint dysfunction, and possible vertebral fracture. No neurological deficits or shingles rash present. Minimal urinary symptoms make a urinary tract infection unlikely. Discussed prednisone , ibuprofen, and muscle relaxers. Ibuprofen is now safe since Brilinta  has been discontinued. X-rays may miss small fractures; conservative treatment is often effective for herniated discs. Emphasized avoiding strenuous activities. Order lumbar spine x-ray, prescribe prednisone , and a muscle relaxer for nighttime use. Advise against ibuprofen while on prednisone , recommend Tylenol  for pain, and order urinalysis to rule out infection. Use ice and heat therapy, avoid strenuous activities like yoga and Pilates, and suggest light walking if comfortable. Discuss potential use of topical treatments like icy hot patches.  Prolapse Prescribed estradiol vaginal cream, unrelated to current back pain. Continue as prescribed.  Hyperlipidemia On atorvastatin  and Zetia  for five years with no changes in medication or symptoms. Continue as prescribed.  History of Myocardial Infarction Myocardial infarction in 2019 with two stents, currently on aspirin  therapy. No current cardiac symptoms. Continue aspirin  therapy as prescribed.  Follow-up Follow up if symptoms  do not improve or worsen. Consider further imaging or specialist referral if no improvement.        Return if symptoms worsen or fail to improve.  Jenkins CHRISTELLA Carrel,  MD

## 2023-08-19 NOTE — Patient Instructions (Addendum)
 No ibuprofen with prednisone   Tylenol  is fine  Worse, etc, ER  Ice, icey hot, heat

## 2023-08-20 ENCOUNTER — Encounter: Payer: Self-pay | Admitting: Family Medicine

## 2023-08-20 NOTE — Progress Notes (Signed)
 Nothing acute.  Some arthritis

## 2023-08-30 ENCOUNTER — Encounter: Payer: Self-pay | Admitting: Family Medicine

## 2023-08-31 ENCOUNTER — Other Ambulatory Visit: Payer: Self-pay | Admitting: *Deleted

## 2023-08-31 MED ORDER — PREDNISONE 20 MG PO TABS: 40.0000 mg | ORAL_TABLET | Freq: Every day | ORAL | 0 refills | Status: AC

## 2023-10-06 DIAGNOSIS — N812 Incomplete uterovaginal prolapse: Secondary | ICD-10-CM | POA: Diagnosis not present

## 2023-10-06 DIAGNOSIS — M6281 Muscle weakness (generalized): Secondary | ICD-10-CM | POA: Diagnosis not present

## 2023-10-06 DIAGNOSIS — R2689 Other abnormalities of gait and mobility: Secondary | ICD-10-CM | POA: Diagnosis not present

## 2023-10-13 ENCOUNTER — Other Ambulatory Visit: Payer: Self-pay | Admitting: Cardiovascular Disease

## 2023-10-15 DIAGNOSIS — R2689 Other abnormalities of gait and mobility: Secondary | ICD-10-CM | POA: Diagnosis not present

## 2023-10-15 DIAGNOSIS — N812 Incomplete uterovaginal prolapse: Secondary | ICD-10-CM | POA: Diagnosis not present

## 2023-10-15 DIAGNOSIS — M6281 Muscle weakness (generalized): Secondary | ICD-10-CM | POA: Diagnosis not present

## 2023-10-26 DIAGNOSIS — M6281 Muscle weakness (generalized): Secondary | ICD-10-CM | POA: Diagnosis not present

## 2023-10-26 DIAGNOSIS — N812 Incomplete uterovaginal prolapse: Secondary | ICD-10-CM | POA: Diagnosis not present

## 2023-10-26 DIAGNOSIS — R2689 Other abnormalities of gait and mobility: Secondary | ICD-10-CM | POA: Diagnosis not present

## 2023-11-02 DIAGNOSIS — R2689 Other abnormalities of gait and mobility: Secondary | ICD-10-CM | POA: Diagnosis not present

## 2023-11-02 DIAGNOSIS — M6281 Muscle weakness (generalized): Secondary | ICD-10-CM | POA: Diagnosis not present

## 2023-11-02 DIAGNOSIS — N812 Incomplete uterovaginal prolapse: Secondary | ICD-10-CM | POA: Diagnosis not present

## 2023-11-09 DIAGNOSIS — N812 Incomplete uterovaginal prolapse: Secondary | ICD-10-CM | POA: Diagnosis not present

## 2023-11-09 DIAGNOSIS — R2689 Other abnormalities of gait and mobility: Secondary | ICD-10-CM | POA: Diagnosis not present

## 2023-11-09 DIAGNOSIS — M6281 Muscle weakness (generalized): Secondary | ICD-10-CM | POA: Diagnosis not present

## 2023-11-23 DIAGNOSIS — N812 Incomplete uterovaginal prolapse: Secondary | ICD-10-CM | POA: Diagnosis not present

## 2023-11-23 DIAGNOSIS — M6281 Muscle weakness (generalized): Secondary | ICD-10-CM | POA: Diagnosis not present

## 2023-11-23 DIAGNOSIS — R2689 Other abnormalities of gait and mobility: Secondary | ICD-10-CM | POA: Diagnosis not present

## 2023-12-01 DIAGNOSIS — R2689 Other abnormalities of gait and mobility: Secondary | ICD-10-CM | POA: Diagnosis not present

## 2023-12-01 DIAGNOSIS — N812 Incomplete uterovaginal prolapse: Secondary | ICD-10-CM | POA: Diagnosis not present

## 2023-12-01 DIAGNOSIS — M6281 Muscle weakness (generalized): Secondary | ICD-10-CM | POA: Diagnosis not present

## 2023-12-17 DIAGNOSIS — N812 Incomplete uterovaginal prolapse: Secondary | ICD-10-CM | POA: Diagnosis not present

## 2023-12-17 DIAGNOSIS — M6281 Muscle weakness (generalized): Secondary | ICD-10-CM | POA: Diagnosis not present

## 2023-12-17 DIAGNOSIS — R2689 Other abnormalities of gait and mobility: Secondary | ICD-10-CM | POA: Diagnosis not present

## 2023-12-22 DIAGNOSIS — H18593 Other hereditary corneal dystrophies, bilateral: Secondary | ICD-10-CM | POA: Diagnosis not present

## 2023-12-28 DIAGNOSIS — M6281 Muscle weakness (generalized): Secondary | ICD-10-CM | POA: Diagnosis not present

## 2023-12-28 DIAGNOSIS — N812 Incomplete uterovaginal prolapse: Secondary | ICD-10-CM | POA: Diagnosis not present

## 2023-12-28 DIAGNOSIS — R2689 Other abnormalities of gait and mobility: Secondary | ICD-10-CM | POA: Diagnosis not present

## 2024-01-03 ENCOUNTER — Other Ambulatory Visit: Payer: Self-pay | Admitting: Cardiovascular Disease

## 2024-01-04 DIAGNOSIS — L814 Other melanin hyperpigmentation: Secondary | ICD-10-CM | POA: Diagnosis not present

## 2024-01-04 DIAGNOSIS — L720 Epidermal cyst: Secondary | ICD-10-CM | POA: Diagnosis not present

## 2024-01-04 DIAGNOSIS — L821 Other seborrheic keratosis: Secondary | ICD-10-CM | POA: Diagnosis not present

## 2024-01-04 DIAGNOSIS — L308 Other specified dermatitis: Secondary | ICD-10-CM | POA: Diagnosis not present

## 2024-01-04 DIAGNOSIS — L570 Actinic keratosis: Secondary | ICD-10-CM | POA: Diagnosis not present

## 2024-01-06 ENCOUNTER — Encounter: Payer: Self-pay | Admitting: Cardiovascular Disease

## 2024-01-11 DIAGNOSIS — M6281 Muscle weakness (generalized): Secondary | ICD-10-CM | POA: Diagnosis not present

## 2024-01-11 DIAGNOSIS — R2689 Other abnormalities of gait and mobility: Secondary | ICD-10-CM | POA: Diagnosis not present

## 2024-01-11 DIAGNOSIS — N812 Incomplete uterovaginal prolapse: Secondary | ICD-10-CM | POA: Diagnosis not present

## 2024-01-12 ENCOUNTER — Ambulatory Visit: Admitting: Cardiovascular Disease

## 2024-01-27 ENCOUNTER — Ambulatory Visit: Admitting: Cardiovascular Disease

## 2024-01-28 ENCOUNTER — Other Ambulatory Visit: Payer: Self-pay | Admitting: Cardiovascular Disease

## 2024-01-29 DIAGNOSIS — M6281 Muscle weakness (generalized): Secondary | ICD-10-CM | POA: Diagnosis not present

## 2024-01-29 DIAGNOSIS — R2689 Other abnormalities of gait and mobility: Secondary | ICD-10-CM | POA: Diagnosis not present

## 2024-01-29 DIAGNOSIS — N812 Incomplete uterovaginal prolapse: Secondary | ICD-10-CM | POA: Diagnosis not present

## 2024-02-08 ENCOUNTER — Ambulatory Visit: Admitting: Cardiovascular Disease

## 2024-02-15 ENCOUNTER — Encounter: Payer: Self-pay | Admitting: Cardiovascular Disease

## 2024-02-15 ENCOUNTER — Ambulatory Visit: Attending: Cardiovascular Disease | Admitting: Cardiovascular Disease

## 2024-02-15 VITALS — BP 108/68 | HR 58 | Ht 62.0 in | Wt 162.5 lb

## 2024-02-15 DIAGNOSIS — E7849 Other hyperlipidemia: Secondary | ICD-10-CM | POA: Diagnosis not present

## 2024-02-15 DIAGNOSIS — I25118 Atherosclerotic heart disease of native coronary artery with other forms of angina pectoris: Secondary | ICD-10-CM | POA: Diagnosis not present

## 2024-02-15 DIAGNOSIS — I2510 Atherosclerotic heart disease of native coronary artery without angina pectoris: Secondary | ICD-10-CM

## 2024-02-15 DIAGNOSIS — E785 Hyperlipidemia, unspecified: Secondary | ICD-10-CM | POA: Diagnosis not present

## 2024-02-15 DIAGNOSIS — Z9861 Coronary angioplasty status: Secondary | ICD-10-CM

## 2024-02-15 LAB — LIPID PANEL

## 2024-02-15 NOTE — Assessment & Plan Note (Signed)
 History of hyperlipidemia on high-dose atorvastatin  and Zetia  the lipid profile performed 04/23/2022 revealing a total cholesterol 121, LDL 65 and HDL of 37.  We will recheck a lipid liver profile today.

## 2024-02-15 NOTE — Progress Notes (Signed)
 02/15/2024 ASCENCION STEGNER   [birth year]  [id number]  Primary Physician Katrinka Garnette KIDD, MD Primary Cardiologist: Dorn JINNY Lesches MD FACP, [place], FAHA, FSCAI  HPI:  Cathy Bell is a 63 y.o.   moderately overweight divorced Caucasian female who I last saw  saw in the office 04/01/2022. She works as a Programme researcher, broadcasting/film/video.  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  She presented on 09/26/2017 with chest pain while walking in the park.  EKG showed inferior ST segment elevation with reciprocal anterior depression.  I brought her to the Cath Lab on Saturday morning and opened up an occluded mid RCA with a synergy drug-eluting stent.  She did have inferobasal hypokinesia which improved by 2D echo the following day.  She did have an 80% mid LAD lesion with a subsequent negative Myoview  stress test.  Her major complaints are of fatigue.  Her other problems include hyperlipidemia on high-dose statin therapy with mildly elevated liver function tests.  She is on dual antiplatelet therapy with Brilinta .   She  had progressive chest pressure especially when walking up an incline.  A recent coronary CTA performed 03/26/2018 confirmed a moderate mid LAD lesion but unfortunately FFR could not be performed.  Recently progressive symptoms of decided to proceed with elective outpatient LAD intervention. I performed outpatient coronary intervention on her 04/22/2018 with a drug-eluting stent placed in the mid LAD.  I also dilated her diagonal branch ostium through the stent struts.  Her RCA stent was patent at that time.  Her symptoms are markedly improved.   Since I saw her  in the office 2 years ago she has done well.  She did have some chest pain symptoms in November 23 and had a Myoview  stress test performed 06/17/2022 which was low risk and nonischemic.  She has been asymptomatic since.   Current Meds   Medication Sig   aspirin  81 MG chewable tablet Chew 1 tablet (81 mg total) by mouth daily.   atorvastatin  (LIPITOR ) 80 MG tablet TAKE 1 TABLET BY MOUTH DAILY AT 6 PM.   cetirizine (ZYRTEC) 10 MG tablet Take 10 mg by mouth as needed for allergies.   Cholecalciferol (VITAMIN D3) 5000 units TABS Take 5,000 Units by mouth 3 (three) times a week. 0630   CRANBERRY PO Take 3 capsules by mouth 3 (three) times daily as needed (for urinary tract infection symptoms.).   D-Mannose 500 MG CAPS Take 500 mg by mouth 3 (three) times daily as needed (for urinary tract infection symptoms.).   estradiol (ESTRACE) 0.1 MG/GM vaginal cream Place vaginally 2 (two) times a week.   ezetimibe  (ZETIA ) 10 MG tablet TAKE 1 TABLET BY MOUTH EVERY DAY   nitroGLYCERIN  (NITROSTAT ) 0.4 MG SL tablet Place 1 tablet (0.4 mg total) under the tongue every 5 (five) minutes x 3 doses as needed for chest pain.   Probiotic Product (PROBIOTIC ADVANCED PO) Take 1 capsule by mouth daily at 6 (six) AM. (0630) Suprema Dophilus   sodium chloride  (MURO 128) 5 % ophthalmic ointment Place 1 application into both eyes at bedtime.     Allergies  Allergen Reactions   Bupropion Hives    Other reaction(s): Unknown  Other Reaction(s): Other (See Comments)   Cefprozil Hives and Other (See Comments)    Other Reaction(s): Other (See Comments)  Other reaction(s): Unknown   Cheese Hives   Fish Allergy Hives  Peanut-Containing Drug Products Hives   Prednisolone     Caused dangerously high eye pressure around time of eye surgery   Wellbutrin [Bupropion Hcl] Hives    Social History   Socioeconomic History   Marital status: Divorced    Spouse name: Not on file   Number of children: 2   Years of education: Not on file   Highest education level: Master's degree (e.g., MA, MS, MEng, MEd, MSW, MBA)  Occupational History   Occupation: Research scientist (medical)  Tobacco Use   Smoking status: Never   Smokeless tobacco: Never  Vaping Use   Vaping status:  Never Used  Substance and Sexual Activity   Alcohol use: No   Drug use: No   Sexual activity: Not Currently  Other Topics Concern   Not on file  Social History Narrative   Divorced. 2 sons Alm 26 and Lang IRANI. No grandkids yet.       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 Graduate school at [employer]- MBA      Hobbies: walking, surface design- Oncologist, play mahjong, movies, reading, travel   Social Drivers of [employer]: Low Risk  (08/19/2023)   Overall Financial Resource Strain (CARDIA)    Difficulty of Paying Living Expenses: Not hard at all  Food Insecurity: No Food Insecurity (08/19/2023)   Hunger Vital Sign    Worried About Running Out of Food in the Last Year: Never true    Ran Out of Food in the Last Year: Never true  Transportation Needs: No Transportation Needs (08/19/2023)   PRAPARE - Administrator, Civil Service (Medical): No    Lack of Transportation (Non-Medical): No  Physical Activity: Sufficiently Active (08/19/2023)   Exercise Vital Sign    Days of Exercise per Week: 3 days    Minutes of Exercise per Session: 60 min  Stress: No Stress Concern Present (08/19/2023)   [employer] of Occupational Health - Occupational Stress Questionnaire    Feeling of Stress : Only a little  Social Connections: Moderately Isolated (08/19/2023)   Social Connection and Isolation Panel    Frequency of Communication with Friends and Family: More than three times a week    Frequency of Social Gatherings with Friends and Family: More than three times a week    Attends Religious Services: Never    Database administrator or Organizations: Yes    Attends Engineer, structural: More than 4 times per year    Marital Status: Divorced  Catering manager Violence: Not on file     Review of Systems: General: negative for chills, fever,  night sweats or weight changes.  Cardiovascular: negative for chest pain, dyspnea on exertion, edema, orthopnea, palpitations, paroxysmal nocturnal dyspnea or shortness of breath Dermatological: negative for rash Respiratory: negative for cough or wheezing Urologic: negative for hematuria Abdominal: negative for nausea, vomiting, diarrhea, bright red blood per rectum, melena, or hematemesis Neurologic: negative for visual changes, syncope, or dizziness All other systems reviewed and are otherwise negative except as noted above.    Blood pressure 108/68, pulse (!) 58, height 5' 2 (1.575 m), weight 162 lb 8 oz (73.7 kg), last menstrual period 03/18/2011, SpO2 98%.  General appearance: alert and no distress Neck: no adenopathy, no carotid bruit, no JVD, supple, symmetrical, trachea midline, and thyroid  not enlarged, symmetric, no tenderness/mass/nodules Lungs: clear to auscultation bilaterally Heart:  regular rate and rhythm, S1, S2 normal, no murmur, click, rub or gallop Extremities: extremities normal, atraumatic, no cyanosis or edema Pulses: 2+ and symmetric Skin: Skin color, texture, turgor normal. No rashes or lesions Neurologic: Grossly normal  EKG EKG Interpretation Date/Time:  Monday February 15 2024 09:34:30 EDT Ventricular Rate:  58 PR Interval:  184 QRS Duration:  86 QT Interval:  400 QTC Calculation: 392 R Axis:   -54  Text Interpretation: Sinus bradycardia Left axis deviation Pulmonary disease pattern When compared with ECG of 23-Apr-2018 03:58, Criteria for Anterior infarct are no longer Present Nonspecific T wave abnormality now evident in Inferior leads Confirmed by Court Carrier [phone]) on 02/15/2024 10:07:55 AM    ASSESSMENT AND PLAN:   Familial hyperlipidemia History of hyperlipidemia on high-dose atorvastatin  and Zetia  the lipid profile performed 04/23/2022 revealing a total cholesterol 121, LDL 65 and HDL of 37.  We will recheck a lipid liver profile today.  CAD  S/P percutaneous coronary angioplasty History of CAD status post anterior STEMI 09/26/2017 while walking in the park on a Saturday morning.  I took her to the Cath Lab revealing an occluded mid RCA which I stented with a Synergy drug-eluting stent.  She did have inferobasal hypokinesia which improved by 2D echo the following day.  She had a 80% mid LAD lesion with subsequent negative Myoview  stress test.  Because of progressive angina when walking on incline coronary CTA performed 03/26/2018 revealed moderate mid LAD lesion.  I catheterized her 04/22/2018 and placed a drug-eluting stent in her mid LAD and also dilated her diagonal branch ostium through stent struts.  Her RCA stent was patent at that time.  She did have some anginal symptoms November 2023 and a stress test performed 06/17/2021 was nonischemic.  She has had no further symptoms and remains on a baby aspirin .     Carrier DOROTHA Court MD FACP,FACC,FAHA, [facility] 02/15/2024 10:20 AM

## 2024-02-15 NOTE — Assessment & Plan Note (Signed)
 History of CAD status post anterior STEMI 09/26/2017 while walking in the park on a Saturday morning.  I took her to the Cath Lab revealing an occluded mid RCA which I stented with a Synergy drug-eluting stent.  She did have inferobasal hypokinesia which improved by 2D echo the following day.  She had a 80% mid LAD lesion with subsequent negative Myoview  stress test.  Because of progressive angina when walking on incline coronary CTA performed 03/26/2018 revealed moderate mid LAD lesion.  I catheterized her 04/22/2018 and placed a drug-eluting stent in her mid LAD and also dilated her diagonal branch ostium through stent struts.  Her RCA stent was patent at that time.  She did have some anginal symptoms November 2023 and a stress test performed 06/17/2021 was nonischemic.  She has had no further symptoms and remains on a baby aspirin .

## 2024-02-15 NOTE — Patient Instructions (Signed)
 Medication Instructions:  Your physician recommends that you continue on your current medications as directed. Please refer to the Current Medication list given to you today.  *If you need a refill on your cardiac medications before your next appointment, please call your pharmacy*  Lab Work: Your physician recommends that you have labs drawn today: Lipid/liver panel  If you have labs (blood work) drawn today and your tests are completely normal, you will receive your results only by: MyChart Message (if you have MyChart) OR A paper copy in the mail If you have any lab test that is abnormal or we need to change your treatment, we will call you to review the results.   Follow-Up: At [facility], you and your health needs are our priority.  As part of our continuing mission to provide you with exceptional heart care, our providers are all part of one team.  This team includes your primary Cardiologist (physician) and Advanced Practice Providers or APPs (Physician Assistants and Nurse Practitioners) who all work together to provide you with the care you need, when you need it.  Your next appointment:   12 month(s)  Provider:   Lauro Portal, MD    We recommend signing up for the patient portal called "MyChart".  Sign up information is provided on this After Visit Summary.  MyChart is used to connect with patients for Virtual Visits (Telemedicine).  Patients are able to view lab/test results, encounter notes, upcoming appointments, etc.  Non-urgent messages can be sent to your provider as well.   To learn more about what you can do with MyChart, go to [email or web address].

## 2024-02-16 ENCOUNTER — Ambulatory Visit: Payer: Self-pay | Admitting: Cardiovascular Disease

## 2024-02-16 DIAGNOSIS — M6281 Muscle weakness (generalized): Secondary | ICD-10-CM | POA: Diagnosis not present

## 2024-02-16 DIAGNOSIS — N812 Incomplete uterovaginal prolapse: Secondary | ICD-10-CM | POA: Diagnosis not present

## 2024-02-16 DIAGNOSIS — R2689 Other abnormalities of gait and mobility: Secondary | ICD-10-CM | POA: Diagnosis not present

## 2024-02-16 LAB — LIPID PANEL
Cholesterol, Total: 120 mg/dL (ref 100–199)
HDL: 38 mg/dL — AB (ref >39)
LDL CALC COMMENT:: 3.2 ratio (ref 0.0–4.4)
LDL Chol Calc (NIH): 66 mg/dL (ref 0–99)
Triglycerides: 83 mg/dL (ref 0–149)
VLDL Cholesterol Cal: 16 mg/dL (ref 5–40)

## 2024-02-16 LAB — HEPATIC FUNCTION PANEL
ALT: 19 IU/L (ref 0–32)
AST: 21 IU/L (ref 0–40)
Albumin: 4.4 g/dL (ref 3.9–4.9)
Alkaline Phosphatase: 87 IU/L (ref 44–121)
Bilirubin Total: 0.8 mg/dL (ref 0.0–1.2)
Bilirubin, Direct: 0.27 mg/dL (ref 0.00–0.40)
Total Protein: 6.6 g/dL (ref 6.0–8.5)

## 2024-02-17 ENCOUNTER — Other Ambulatory Visit: Payer: Self-pay | Admitting: Cardiovascular Disease

## 2024-02-22 MED ORDER — ATORVASTATIN CALCIUM 80 MG PO TABS
80.0000 mg | ORAL_TABLET | Freq: Every day | ORAL | 3 refills | Status: AC
Start: 2024-02-22 — End: ?

## 2024-03-03 DIAGNOSIS — N812 Incomplete uterovaginal prolapse: Secondary | ICD-10-CM | POA: Diagnosis not present

## 2024-03-03 DIAGNOSIS — R2689 Other abnormalities of gait and mobility: Secondary | ICD-10-CM | POA: Diagnosis not present

## 2024-03-03 DIAGNOSIS — M6281 Muscle weakness (generalized): Secondary | ICD-10-CM | POA: Diagnosis not present

## 2024-04-11 DIAGNOSIS — Z01419 Encounter for gynecological examination (general) (routine) without abnormal findings: Secondary | ICD-10-CM | POA: Diagnosis not present

## 2024-04-11 DIAGNOSIS — Z6827 Body mass index (BMI) 27.0-27.9, adult: Secondary | ICD-10-CM | POA: Diagnosis not present

## 2024-04-11 DIAGNOSIS — Z1231 Encounter for screening mammogram for malignant neoplasm of breast: Secondary | ICD-10-CM | POA: Diagnosis not present

## 2024-07-18 ENCOUNTER — Other Ambulatory Visit: Payer: Self-pay

## 2024-07-18 ENCOUNTER — Ambulatory Visit: Attending: Obstetrics and Gynecology | Admitting: Physical Therapy

## 2024-07-18 DIAGNOSIS — R279 Unspecified lack of coordination: Secondary | ICD-10-CM | POA: Insufficient documentation

## 2024-07-18 DIAGNOSIS — R293 Abnormal posture: Secondary | ICD-10-CM | POA: Diagnosis present

## 2024-07-18 DIAGNOSIS — M6281 Muscle weakness (generalized): Secondary | ICD-10-CM | POA: Diagnosis present

## 2024-07-18 NOTE — Patient Instructions (Signed)
  15-20 mins in the evenings based on tolerance.  Please clear any positions with doctor as needed.  Please stop any position if negative symptoms occur (dizziness/lightheadedness/fatigue/increased pain, etc.)  

## 2024-07-18 NOTE — Therapy (Signed)
 " OUTPATIENT PHYSICAL THERAPY FEMALE PELVIC EVALUATION   Patient Name: Cathy Bell MRN: [id number] DOB:1960/11/11, 64 y.o., female Today's Date: 07/18/2024  END OF SESSION:  PT End of Session - 07/18/24 1503     Visit Number 1    Date for Recertification  10/16/24    Authorization Type [employer]    Authorization Time Period no auth    PT Start Time 1401    PT Stop Time 1448    PT Time Calculation (min) 47 min    Activity Tolerance Patient tolerated treatment well    Behavior During Therapy WFL for tasks assessed/performed          Past Medical History:  Diagnosis Date   Acute ST elevation myocardial infarction (STEMI) of inferior wall (HCC) 09/26/2017   Pt presented 09/26/17 with an acute inferior STEMI. Door to balloon time 30 minutes.    Coronary artery disease    Distended abdomen    Heart disease    History of Epstein-Barr virus infection    Hyperlipidemia    on meds   IBS (irritable bowel syndrome)    OSA (obstructive sleep apnea) 08/19/2018    mild obstructive sleep apnea with an AHI of 10.6/h and oxygen desaturations as low as 88%.   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   Seasonal allergies    Sleep apnea 2007   uses CPAP   Past Surgical History:  Procedure Laterality Date   CARDIAC CATHETERIZATION     CORONARY ANGIOPLASTY WITH STENT PLACEMENT  04/22/2018   CORONARY STENT INTERVENTION N/A 04/22/2018   Procedure: CORONARY STENT INTERVENTION;  Surgeon: Court Dorn PARAS, MD;  Location: [facility];  Service: Cardiovascular;  Laterality: N/A;   CORONARY/GRAFT ACUTE MI REVASCULARIZATION N/A 09/26/2017   Procedure: Coronary/Graft Acute MI Revascularization;  Surgeon: Court Dorn PARAS, MD;  Location: [facility];  Service: Cardiovascular;  Laterality: N/A;   LEFT HEART CATH AND CORONARY ANGIOGRAPHY N/A 09/26/2017   Procedure: LEFT HEART CATH AND CORONARY ANGIOGRAPHY;  Surgeon: Court Dorn PARAS, MD;  Location: [facility];   Service: Cardiovascular;  Laterality: N/A;   nodulectomy  2018   Salzmann Nodular Degeneration x 2 procedures (bilateral eyes sx)   STENT PLACEMENT VASCULAR ([facility] HX)  2019   TONSILLECTOMY AND ADENOIDECTOMY  1967   WISDOM TOOTH EXTRACTION  1981   Patient Active Problem List   Diagnosis Date Noted   OSA (obstructive sleep apnea) 08/19/2018   CAD (coronary artery disease) 04/22/2018   Unstable angina (HCC)    Hot flashes due to menopause 12/08/2017   History of gestational diabetes 11/03/2017   Brain lesion 11/03/2017   Multiple thyroid  nodules 11/03/2017   Rosacea 11/03/2017   Overweight (BMI 25.0-29.9) 11/03/2017   CAD S/P percutaneous coronary angioplasty 10/20/2017   Exertional chest pain 10/20/2017   Familial hyperlipidemia    Acute ST elevation myocardial infarction (STEMI) of inferior wall (HCC) 09/26/2017   FATIGUE, CHRONIC 03/10/2007    PCP: Katrinka Garnette KIDD, MD   REFERRING PROVIDER: Latisha Medford, MD  REFERRING DIAG: N81.4 (ICD-10-CM) - Uterovaginal prolapse, unspecified  THERAPY DIAG:  Muscle weakness (generalized)  Abnormal posture  Unspecified lack of coordination  Rationale for Evaluation and Treatment: Rehabilitation  ONSET DATE: couple years  SUBJECTIVE:  SUBJECTIVE STATEMENT: Attempted pessary use for prolapse, doesn't really like it. Has trouble exercising, worse with weight gain. Is no longer wearing pessary. Does use estrogen cream. Not really getting worse but seems more frequently bothersome.  Also getting PT for back and hips. Reports she feels pressure both with digestion changes but also sometimes with overall fatigue, but mostly feels like the cervix almost dilates.  Doesn't all the time as long as exercising which helps it. Without exercising feels it  constantly.    Fluid intake: water - 32 oz  FUNCTIONAL LIMITATIONS: exercising   PERTINENT HISTORY:  Medications for current condition: vaginal estrogen cream Surgeries: no Other:  Sexual abuse: No  PAIN:  Are you having pain? No   PRECAUTIONS: None  RED FLAGS: None   WEIGHT BEARING RESTRICTIONS: No  FALLS:  Has patient fallen in last 6 months? No  OCCUPATION: travels frequently   ACTIVITY LEVEL : high (PT exercises, yoga, strengthening)   PLOF: Independent  PATIENT GOALS: to have less prolapse symptoms    BOWEL MOVEMENT: Pain with bowel movement: No Type of bowel movement:Type (Bristol Stool Scale) 4, Frequency daily, and Strain sometimes  Fully empty rectum: Yes: not always (in the past month) but right now no Leakage: doesn't think so but recently reports seeing some dampness but not urine and unsure what this                                        Bowel urgency: no Pads: No Fiber supplement/laxative No but does do a probiotic    URINATION: Pain with urination: No Fully empty bladder: no                                          Post-void dribble: No Stream: Strong Urgency: Yes sometimes Frequency:usually every couple hours                                                      Nocturia: No   Leakage: no Pads/briefs: No  INTERCOURSE:  Ability to have vaginal penetration Yes  Pain with intercourse: not active but was a little painful  Dryness: No Climax: yes Marinoff Scale: 1/3 Lubricant: if needed   PREGNANCY: Vaginal deliveries 2 Tearing Yes: mild Episiotomy No C-section deliveries 0 Currently pregnant No  PROLAPSE: Pressure   OBJECTIVE:  Note: Objective measures were completed at Evaluation unless otherwise noted.    COGNITION: Overall cognitive status: Within functional limits for tasks assessed     SENSATION: Light touch: Appears intact   FUNCTIONAL TESTS:   Single leg stance: mild hip drop bil but slightly more on Rt  stance   GAIT: WFL  POSTURE: posterior pelvic tilt   LUMBARAROM/PROM:  A/PROM A/PROM  Eval (% available)  Flexion 100  Extension 100  Right lateral flexion 100  Left lateral flexion 100  Right rotation   Left rotation    (Blank rows = not tested)  LOWER EXTREMITY ROM:  Functionally within range  LOWER EXTREMITY MMT:  Bil hips grossly 4+/5 PALPATION:  General: slight tension in bil lumbar paraspinals  Pelvic Alignment:  Abdominal: no TTP but slight  tension in lower abdomen                  External Perineal Exam: no TTP, does have tissue lightening but reports she has started using a vaginal moisturizer                              Internal Pelvic Floor: no TTP  Patient confirms identification and approves PT to assess internal pelvic floor and treatment Yes No emotional/communication barriers or cognitive limitation. Patient is motivated to learn. Patient understands and agrees with treatment goals and plan. PT explains patient will be examined in standing, sitting, and lying down to see how their muscles and joints work. When they are ready, they will be asked to remove their underwear so PT can examine their perineum. The patient is also given the option of providing their own chaperone as one is not provided in our facility. The patient also has the right and is explained the right to defer or refuse any part of the evaluation or treatment including the internal exam. With the patient's consent, PT will use one gloved finger to gently assess the muscles of the pelvic floor, seeing how well it contracts and relaxes and if there is muscle symmetry. After, the patient will get dressed and PT and patient will discuss exam findings and plan of care. PT and patient discuss plan of care, schedule, attendance policy and HEP activities.  All internal or external pelvic floor assessments and/or treatments are completed with proper hand hygiene and gloves hands. If needed gloves are  changed with hand hygiene during patient care time.  PELVIC MMT:   MMT eval  Vaginal 3/5, 5s, 4 reps  Internal Anal Sphincter   External Anal Sphincter   Puborectalis   (Blank rows = not tested)        TONE: WFL  PROLAPSE: Anterior vaginal wall laxity noted with cough and at rest in hooklying, worsened with cough. Never outside of introitus.  Did improve with cues for pelvic floor contraction and cough   TODAY'S TREATMENT:                                                                                                                              DATE:   07/18/24 EVAL Examination completed, findings reviewed, pt educated on POC, HEP, and pelvic props. Pt motivated to participate in PT and agreeable to attempt recommendations.     PATIENT EDUCATION:  Education details: [id number] Person educated: Patient Education method: Programmer, Multimedia, Demonstration, Actor cues, Verbal cues, and Handouts Education comprehension: verbalized understanding, returned demonstration, verbal cues required, tactile cues required, and needs further education  HOME EXERCISE PROGRAM: [id number]  ASSESSMENT:  CLINICAL IMPRESSION: Patient is a 64 y.o. female  who was seen today for physical therapy evaluation and treatment for prolapse. Pt found to have slight weakness in core and bil hips, slight decreased flexibility in  spine but no pain and mild instability in single leg stance bil. Patient consented to internal pelvic floor assessment vaginally this date and found to have decreased strength, endurance, and coordination and notable laxity at rest and with cough, was reduced with pelvic floor activation and cough with cues. Pt assessed in hooklying, denied pain throughout and reports estrogen cream has been helpful and does use as directed. Pt reports she is active and is very bothered by prolapse with working out but reports as long as she is active it is better overall, if she stops working out notes worsening of  symptoms and harder to reduce. Pt denies leakage but recently has had somedigestive changes and notes discharge present at underwear unsure vaginally or rectally at this time and has seen change in bowel habits slightly. Pt would benefit from additional PT to further address deficits.    OBJECTIVE IMPAIRMENTS: decreased activity tolerance, decreased coordination, decreased endurance, decreased mobility, decreased strength, increased fascial restrictions, impaired perceived functional ability, increased muscle spasms, impaired flexibility, and postural dysfunction.   ACTIVITY LIMITATIONS: carrying, lifting, standing, squatting, and locomotion level  PARTICIPATION LIMITATIONS: community activity  PERSONAL FACTORS: Time since onset of injury/illness/exacerbation and 1 comorbidity: postmenopausal  are also affecting patient's functional outcome.   REHAB POTENTIAL: Good  CLINICAL DECISION MAKING: Stable/uncomplicated  EVALUATION COMPLEXITY: Low   GOALS: Goals reviewed with patient? Yes  SHORT TERM GOALS: Target date: 08/15/24  Pt to be I with HEP for carry over and continuing recommendations for improved outcomes.   Baseline: Goal status: INITIAL  2.  Pt will be able to correctly perform diaphragmatic breathing and appropriate pressure management in order to prevent worsening vaginal wall laxity and improve pelvic floor A/ROM.   Baseline:  Goal status: INITIAL  3.  Pt to demonstrate consistent transverse abdominis activation with all exercises with proper breathing mechanics to decreased strain/stress at pelvic floor and prolapse.  Baseline:  Goal status: INITIAL  4. Pt will be independent with use of squatty potty, relaxed toileting mechanics, and improved bowel movement techniques in order to increase ease of bowel movements and complete evacuation.   Baseline:  Goal status: INITIAL  LONG TERM GOALS: Target date: 10/16/24  Pt to be I with advanced HEP for carry over and continuing  recommendations for improved outcomes.   Baseline:  Goal status: INITIAL  2. Pt will report her bowel movements   are complete at least 75% of the time due to improved bowel habits and evacuation techniques to decreased strain at prolapse.  Baseline:  Goal status: INITIAL  3.  Pt to report at least 50% reduction in symptoms for improved QOL and ability to complete walking for at least 60 mins. Baseline:  Goal status: INITIAL  4.  Pt to demonstrate improved coordination of pelvic floor and breathing mechanics with 15# squat with appropriate synergistic patterns to decrease pain and leakage at least 75% of the time for improved ability to complete a 30 minute workout without strain at pelvic floor and symptoms.    Baseline:  Goal status: INITIAL  5.  Pt to no straining with bowels or bladder for decreased strain at prolapse and pelvic floor.  Baseline:  Goal status: INITIAL    PLAN:  PT FREQUENCY: 1x/week  PT DURATION: 8 sessions  PLANNED INTERVENTIONS: 97110-Therapeutic exercises, 97530- Therapeutic activity, [id number]- Neuromuscular re-education, 97535- Self Care, [id number]- Manual therapy, [id number]- Canalith repositioning, [id number]- Aquatic Therapy, [id number]- Electrical stimulation (manual), [id number]- Vasopneumatic device, [id number] (1-2 muscles), 20561 (3+ muscles)- Dry Needling,  Patient/Family education, Taping, Joint mobilization, Spinal mobilization, Scar mobilization, DME instructions, Cryotherapy, Moist heat, and Biofeedback  PLAN FOR NEXT SESSION: coordination of pelvic floor and breathing mechanics and exercise, pelvic propping if needed or modifications with pelvic propping, knack   Darryle Navy, PT, DPT 1/5/20263:04 PM  [facility] [address] [place], [place] [address] Phone # [phone] Fax [phone]   "

## 2024-08-29 ENCOUNTER — Ambulatory Visit: Admitting: Physical Therapy

## 2024-09-08 ENCOUNTER — Ambulatory Visit: Admitting: Physical Therapy
